# Patient Record
Sex: Male | Born: 1985 | Race: White | Hispanic: No | Marital: Married | Smoking: Former smoker
Health system: Southern US, Community
[De-identification: ages and names within clinical notes are randomized; demographics above are authoritative.]

## PROBLEM LIST (undated history)

## (undated) DIAGNOSIS — K317 Polyp of stomach and duodenum: Secondary | ICD-10-CM

## (undated) DIAGNOSIS — K649 Unspecified hemorrhoids: Secondary | ICD-10-CM

## (undated) DIAGNOSIS — L723 Sebaceous cyst: Secondary | ICD-10-CM

## (undated) DIAGNOSIS — J302 Other seasonal allergic rhinitis: Secondary | ICD-10-CM

## (undated) DIAGNOSIS — K219 Gastro-esophageal reflux disease without esophagitis: Secondary | ICD-10-CM

## (undated) HISTORY — DX: Unspecified hemorrhoids: K64.9

## (undated) HISTORY — PX: WISDOM TOOTH EXTRACTION: SHX21

## (undated) HISTORY — DX: Polyp of stomach and duodenum: K31.7

## (undated) HISTORY — PX: HEMORRHOID BANDING: SHX5850

---

## 2005-04-09 ENCOUNTER — Ambulatory Visit: Payer: Self-pay | Admitting: Internal Medicine

## 2005-07-14 ENCOUNTER — Ambulatory Visit: Payer: Self-pay | Admitting: Gastroenterology

## 2006-03-22 ENCOUNTER — Ambulatory Visit: Payer: Self-pay | Admitting: Internal Medicine

## 2006-10-11 ENCOUNTER — Ambulatory Visit: Payer: Self-pay | Admitting: Internal Medicine

## 2007-12-29 ENCOUNTER — Encounter: Payer: Self-pay | Admitting: *Deleted

## 2007-12-29 DIAGNOSIS — K219 Gastro-esophageal reflux disease without esophagitis: Secondary | ICD-10-CM | POA: Insufficient documentation

## 2007-12-29 DIAGNOSIS — K649 Unspecified hemorrhoids: Secondary | ICD-10-CM | POA: Insufficient documentation

## 2008-01-08 ENCOUNTER — Ambulatory Visit: Payer: Self-pay | Admitting: Internal Medicine

## 2008-01-08 DIAGNOSIS — F17200 Nicotine dependence, unspecified, uncomplicated: Secondary | ICD-10-CM | POA: Insufficient documentation

## 2008-08-11 ENCOUNTER — Ambulatory Visit: Payer: Self-pay | Admitting: Internal Medicine

## 2008-08-11 DIAGNOSIS — M279 Disease of jaws, unspecified: Secondary | ICD-10-CM | POA: Insufficient documentation

## 2010-01-06 ENCOUNTER — Ambulatory Visit: Payer: Self-pay | Admitting: Internal Medicine

## 2010-01-06 LAB — CONVERTED CEMR LAB
ALT: 30 units/L (ref 0–53)
AST: 25 units/L (ref 0–37)
BUN: 16 mg/dL (ref 6–23)
Basophils Relative: 0.2 % (ref 0.0–3.0)
Chloride: 106 meq/L (ref 96–112)
Eosinophils Relative: 2.8 % (ref 0.0–5.0)
GFR calc non Af Amer: 97.09 mL/min (ref >60)
HCT: 47.6 % (ref 39.0–52.0)
Hemoglobin: 15.8 g/dL (ref 13.0–17.0)
LDL Cholesterol: 54 mg/dL (ref 0–99)
Lymphs Abs: 1.9 10*3/uL (ref 0.7–4.0)
MCV: 93.1 fL (ref 78.0–100.0)
Monocytes Relative: 5.8 % (ref 3.0–12.0)
Platelets: 280 10*3/uL (ref 150.0–400.0)
Potassium: 3.6 meq/L (ref 3.5–5.1)
RBC: 5.12 M/uL (ref 4.22–5.81)
Sodium: 143 meq/L (ref 135–145)
TSH: 1.16 microintl units/mL (ref 0.35–5.50)
Total Bilirubin: 0.3 mg/dL (ref 0.3–1.2)
Total CHOL/HDL Ratio: 3
Total Protein: 7 g/dL (ref 6.0–8.3)
VLDL: 17.2 mg/dL (ref 0.0–40.0)
WBC: 7.5 10*3/uL (ref 4.5–10.5)

## 2010-01-08 ENCOUNTER — Ambulatory Visit: Payer: Self-pay | Admitting: Internal Medicine

## 2010-11-23 NOTE — Miscellaneous (Signed)
Summary: Doctor, [facility]   Imported By: Lester Warsaw 01/15/2010 09:07:05  _____________________________________________________________________  External Attachment:    Type:   Image     Comment:   External Document

## 2010-11-23 NOTE — Assessment & Plan Note (Signed)
Summary: CPX-LB   Vital Signs:  Patient profile:   25 year old male Height:      70 inches Weight:      149 pounds BMI:     21.46 O2 Sat:      97 % on Room air Temp:     97.7 degrees F oral Pulse rate:   73 / minute BP sitting:   118 / 78  (left arm) Cuff size:   regular  Vitals Entered By: Bill Salinas CMA (January 08, 2010 1:29 PM)  O2 Flow:  Room air CC: cpx/ ab   Primary Care Provider:  Rayshell Goecke  CC:  cpx/ ab.  History of Present Illness: GI- has been on PPI therapy and would like to stop but whenever he stops he has rebound. Recommend transitioning with H2 blocker.  Had a problem with a painful, question of thrombosed hemorrhoid. Reposnded to heat and anusol HC 2.5%  Current Medications (verified): 1)  Omeprazole 40 Mg Cpdr (Omeprazole) .Marland Kitchen.. 1 Once Daily  Allergies (verified): No Known Drug Allergies  Past History:  Past Medical History: Last updated: 12/29/2007 HEMORRHOIDS (ICD-455.6) GASTROESOPHAGEAL REFLUX DISEASE (ICD-530.81)    Past Surgical History: Last updated: 08/11/2008 wisdom teeth extracted.  Family History: Last updated: 08/11/2008 mother - allergies father - ? 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 mother - allergies father - ? EtOH in grandfather Arthritis in GP MGM - breast cancer MGF with CAD, Lipids, Kidney disease, Diabetes Neg-colon cancer  Social History: Reviewed history from 08/11/2008 and no changes required. 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    Physical Exam  General:  WNWD white male in no distress Head:  Normocephalic and atraumatic without obvious abnormalities. No apparent alopecia or balding. Eyes:  No corneal or conjunctival inflammation noted. EOMI. Perrla. Funduscopic exam benign, without hemorrhages, exudates or papilledema. Vision grossly normal. Ears:  External ear exam shows no significant lesions or deformities.  Otoscopic examination reveals clear canals, tympanic membranes are intact bilaterally without bulging, retraction, inflammation or discharge. Hearing is grossly normal bilaterally. Nose:  External nasal examination shows no deformity or inflammation. Nasal mucosa are pink and moist without lesions or exudates. Mouth:  Oral mucosa and oropharynx without lesions or exudates.  Teeth in good repair. Neck:  No deformities, masses, or tenderness noted. Chest Wall:  No deformities, masses, tenderness or gynecomastia noted. Lungs:  Normal respiratory effort, chest expands symmetrically. Lungs are clear to auscultation, no crackles or wheezes. Heart:  Normal rate and regular rhythm. S1 and S2 normal without gallop, murmur, click, rub or other extra sounds. Abdomen:  Bowel sounds positive,abdomen soft and non-tender without masses, organomegaly or hernias noted. Msk:  No deformity or scoliosis noted of thoracic or lumbar spine.   Pulses:  R and L carotid,radial,femoral,dorsalis pedis and posterior tibial pulses are full and equal bilaterally  Extremities:  No clubbing, cyanosis, edema, or deformity noted with normal full range of motion of all joints.   Neurologic:  No  cranial nerve deficits noted. Station and gait are normal. Plantar reflexes are down-going bilaterally. DTRs are symmetrical throughout. Sensory, motor and coordinative functions appear intact. Skin:  turgor normal, color normal, and no rashes.   Cervical Nodes:  no anterior cervical adenopathy and no posterior cervical adenopathy.   Inguinal Nodes:  no R inguinal adenopathy and no L inguinal adenopathy.   Psych:  Cognition and judgment appear intact. Alert and cooperative with normal attention span and concentration. No apparent delusions, illusions, hallucinations   Impression & Recommendations:  Problem # 1:  GASTROESOPHAGEAL REFLUX DISEASE (ICD-530.81) Discussed trial of weaning off PPI: start otc zantac 150 two times a day and after three days stop PPI. If successfull continue two times a day zantac for one month, then reduce to at bedtime dosing for one month then as needed dosing. If unsuccessful then continue PPI  His updated medication list for this problem includes:    Omeprazole 40 Mg Cpdr (Omeprazole) .Marland Kitchen... 1 once daily  Problem # 2:  HEMORRHOIDS (ICD-455.6) Not an active problem. encourage a regular bowel habit without lingering.  Problem # 3:  CIGARETTE SMOKER (ICD-305.1) Essentially stopped smoking. encouraged him to continue with cessation even in the face of occasional relapse.  Problem # 4:  Preventive Health Care (ICD-V70.0) Normal exam. Lab results are fine. Encouraged a regular aerobic exercise program. Advised to have routine labs in 5 years and complete exam as required by employment or insurance until age 66 at which point he should have 2-3 exams untill age 29 at which time he should have exam every 3 years.  In summary- a healthy man. Encouraged to continue smoking cessatiion efforts.  Complete Medication List: 1)  Omeprazole 40 Mg Cpdr (Omeprazole) .Marland Kitchen.. 1 once daily  Other Orders: Tobacco use cessation intermediate 3-10 minutes (40981)   Patient: PATRICIA  TARDUOGNO Note: All result statuses are Final unless otherwise noted.  Tests: (1) Lipid Panel (LIPID)   Cholesterol               128 mg/dL                   1-914     ATP III Classification            Desirable:  < 200 mg/dL                    Borderline High:  200 - 239 mg/dL               High:  > = 240 mg/dL   Triglycerides             98.0 mg/dL                  7.8-295.6     Normal:  <150 mg/dL     Borderline High:  213 - 199 mg/dL   HDL                       08.65 mg/dL                 >78.46   VLDL Cholesterol          19.6 mg/dL                  9.6-29.5   LDL Cholesterol  51 mg/dL                    1-47  CHO/HDL Ratio:  CHD Risk                             2                    Men          Women     1/2 Average Risk     3.4          3.3     Average Risk          5.0          4.4     2X Average Risk          9.6          7.1     3X Average Risk          15.0          11.0                           Tests: (2) Hepatic/Liver Function Panel (HEPATIC)   Total Bilirubin           0.6 mg/dL                   8.2-9.5   Direct Bilirubin          0.1 mg/dL                   6.2-1.3   Alkaline Phosphatase      59 U/L                      39-117   AST                       24 U/L                      0-37   ALT                       24 U/L                      0-35   Total Protein             7.4 g/dL                    0.8-6.5   Albumin                   4.3 g/dL                    7.8-4.6  Tests: (3) BMP (METABOL)   Sodium                    138 mEq/L                   135-145   Potassium                 3.9 mEq/L                   3.5-5.1   Chloride  102 mEq/L                   96-112   Carbon Dioxide            29 mEq/L                    19-32   Glucose              [H]  104 mg/dL                   16-10   BUN                       15 mg/dL                    9-60   Creatinine                0.7 mg/dL                   4.5-4.0   Calcium                    9.6 mg/dL                   9.8-11.9   GFR                       85.16 mL/min                >60  Immunization History:  Tetanus/Td Immunization History:    Tetanus/Td:  historical (11/24/2006)

## 2012-10-26 ENCOUNTER — Other Ambulatory Visit: Payer: Self-pay | Admitting: Internal Medicine

## 2012-10-26 ENCOUNTER — Telehealth: Payer: Self-pay | Admitting: Internal Medicine

## 2012-10-26 MED ORDER — OSELTAMIVIR PHOSPHATE 75 MG PO CAPS: 75.0000 mg | ORAL_CAPSULE | Freq: Two times a day (BID) | ORAL | Status: DC

## 2012-10-26 NOTE — Telephone Encounter (Signed)
Dr. Myrtis Ser called: Bernard [name] just returned from the [facility] and is running a high fever, myalgia, coryza, doesn't feel well. Did not have flu vaccine.  Plan - for presumed influenza Tamiflu 75 mg bid x 5 days. Household contacts should receive prophylaxis

## 2012-12-18 ENCOUNTER — Encounter: Payer: Self-pay | Admitting: Internal Medicine

## 2012-12-18 ENCOUNTER — Ambulatory Visit (INDEPENDENT_AMBULATORY_CARE_PROVIDER_SITE_OTHER): Payer: 59 | Admitting: Internal Medicine

## 2012-12-18 VITALS — BP 118/74 | HR 104 | Temp 97.8°F | Resp 10 | Wt 150.0 lb

## 2012-12-18 DIAGNOSIS — F5221 Male erectile disorder: Secondary | ICD-10-CM

## 2012-12-18 DIAGNOSIS — F17200 Nicotine dependence, unspecified, uncomplicated: Secondary | ICD-10-CM

## 2012-12-18 DIAGNOSIS — F528 Other sexual dysfunction not due to a substance or known physiological condition: Secondary | ICD-10-CM

## 2012-12-18 NOTE — Patient Instructions (Addendum)
1. Nicotine use - GREAT that you have given up cigarettes. BUT, you need to be Nicotine free.  2. Acid related problems - PPI have questionable long term effects. There is also a rebound phenomena so that when you stop a PPI there can be increased discomfort. Plan Transition to Ranitidine 150 mg AM and PM after 2-3 days stop the PPI and go with just the Ranitidine.  3. Sexual dysfunction - does not sound physiologic but can't be sure. There is certainly potential for this being psychologic. Plan  candid discussion; exploration, i.e. Ruthy Dick, etc  Trail of medication - Viagra  Sex counseling can be very helpful if medications don't

## 2012-12-20 ENCOUNTER — Encounter: Payer: Self-pay | Admitting: Internal Medicine

## 2012-12-20 NOTE — Progress Notes (Signed)
  Subjective:    Patient ID: Bernard [name], male    DOB: 1986-05-23, 27 y.o.   MRN: [id number]  HPI Bernard [name] presents to discuss issues related to loss of tumescence.   In the interval since his last visit he has been doing really well. He has stopped smoking cigarettes but is using a nicotine E-cigarette.   He reports that in a new relationship he has had several episodes of loss of tumescence. By his report this has been related to a particular position of coitus. He has no problems at other times and is able to maintain an erection to completion.   STD transmission, prevention and treatment was discussed with him, particularly around the issue of HPV.   Past Medical History: Last updated: 12/29/2007 HEMORRHOIDS (ICD-455.6) GASTROESOPHAGEAL REFLUX DISEASE (ICD-530.81)    Past Surgical History: Last updated: 08/11/2008 wisdom teeth extracted.  Family History: Last updated: 08/11/2008 mother - allergies father - ? 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     Objective:   Physical Exam Filed Vitals:   12/18/12 1500  BP: 118/74  Pulse: 104  Temp: 97.8 F (36.6 C)  Resp: 10   Gen'l- WNWD white man in no distress HEENT - C&S clear Cor- RRR Pulm - normal respirations Neuro - A&O x 3       Assessment & Plan:  Loss of tumescence - discussed the complexities of sexual function and response. He has no evidence of organic impotence. Issues of tumescence may be tied to performance anxiety.  Plan encourged franks discussion with his partner  Trial of viagra (suspect there  is placebo benefit)  For continued problems recommend meeting with a sex counselor.

## 2012-12-20 NOTE — Assessment & Plan Note (Signed)
Is abstinent from cigarettes for several months - GREAT. Advised to reduce and cease nicotine containing E-cigs.

## 2014-01-06 ENCOUNTER — Telehealth: Payer: Self-pay

## 2014-01-06 ENCOUNTER — Other Ambulatory Visit: Payer: Self-pay | Admitting: Internal Medicine

## 2014-01-06 ENCOUNTER — Encounter: Payer: Self-pay | Admitting: Gastroenterology

## 2014-01-06 DIAGNOSIS — K219 Gastro-esophageal reflux disease without esophagitis: Secondary | ICD-10-CM

## 2014-01-06 NOTE — Telephone Encounter (Signed)
Pt scheduled to see Dr. Arlyce DiceKaplan 01/22/14@10 :30am. Pt aware of appt.

## 2014-01-06 NOTE — Telephone Encounter (Signed)
Message copied by Chrystie NoseHUNT, LINDA R on Mon Jan 06, 2014  2:22 PM ------      Message from: Melvia HeapsKAPLAN, ROBERT D      Created: Mon Jan 06, 2014  2:08 PM      Regarding: RE: From Rosebud PolesJeff Katz       Jeff, happy to reevaluate him.  I'll try to get him in ASAP.      Rob      ----- Message -----         From: Luis AbedJeffrey D Katz, MD         Sent: 01/06/2014   1:02 PM           To: Louis Meckelobert D Kaplan, MD      Subject: From Jerral BonitoJeff Katz                                           Avanell ShackletonHi Rob,              Mike Norins has sent you a consult request today for my step-son Bernard [name]. You had seen him many years ago. He continues to have reflux symptoms despite meds. Please see him when you can. I am worried about the persistence of his symptoms. You will know best, but I suspect it is time for endoscopy along with change in his therapy and follow up.            Thanks a bunch.             Trey PaulaJeff       ------

## 2014-01-22 ENCOUNTER — Encounter: Payer: Self-pay | Admitting: Gastroenterology

## 2014-01-22 ENCOUNTER — Ambulatory Visit (INDEPENDENT_AMBULATORY_CARE_PROVIDER_SITE_OTHER): Payer: BC Managed Care – PPO | Admitting: Gastroenterology

## 2014-01-22 VITALS — BP 120/86 | HR 72 | Ht 69.0 in | Wt 153.5 lb

## 2014-01-22 DIAGNOSIS — K219 Gastro-esophageal reflux disease without esophagitis: Secondary | ICD-10-CM

## 2014-01-22 NOTE — Patient Instructions (Signed)
Gastroesophageal Reflux Disease, Adult Gastroesophageal reflux disease (GERD) happens when acid from your stomach flows up into the esophagus. When acid comes in contact with the esophagus, the acid causes soreness (inflammation) in the esophagus. Over time, GERD may create small holes (ulcers) in the lining of the esophagus. CAUSES   Increased body weight. This puts pressure on the stomach, making acid rise from the stomach into the esophagus.  Smoking. This increases acid production in the stomach.  Drinking alcohol. This causes decreased pressure in the lower esophageal sphincter (valve or ring of muscle between the esophagus and stomach), allowing acid from the stomach into the esophagus.  Late evening meals and a full stomach. This increases pressure and acid production in the stomach.  A malformed lower esophageal sphincter. Sometimes, no cause is found. SYMPTOMS   Burning pain in the lower part of the mid-chest behind the breastbone and in the mid-stomach area. This may occur twice a week or more often.  Trouble swallowing.  Sore throat.  Dry cough.  Asthma-like symptoms including chest tightness, shortness of breath, or wheezing. DIAGNOSIS  Your caregiver may be able to diagnose GERD based on your symptoms. In some cases, X-rays and other tests may be done to check for complications or to check the condition of your stomach and esophagus. TREATMENT  Your caregiver may recommend over-the-counter or prescription medicines to help decrease acid production. Ask your caregiver before starting or adding any new medicines.  HOME CARE INSTRUCTIONS   Change the factors that you can control. Ask your caregiver for guidance concerning weight loss, quitting smoking, and alcohol consumption.  Avoid foods and drinks that make your symptoms worse, such as:  Caffeine or alcoholic drinks.  Chocolate.  Peppermint or mint flavorings.  Garlic and onions.  Spicy foods.  Citrus fruits,  such as oranges, lemons, or limes.  Tomato-based foods such as sauce, chili, salsa, and pizza.  Fried and fatty foods.  Avoid lying down for the 3 hours prior to your bedtime or prior to taking a nap.  Eat small, frequent meals instead of large meals.  Wear loose-fitting clothing. Do not wear anything tight around your waist that causes pressure on your stomach.  Raise the head of your bed 6 to 8 inches with wood blocks to help you sleep. Extra pillows will not help.  Only take over-the-counter or prescription medicines for pain, discomfort, or fever as directed by your caregiver.  Do not take aspirin, ibuprofen, or other nonsteroidal anti-inflammatory drugs (NSAIDs). SEEK IMMEDIATE MEDICAL CARE IF:   You have pain in your arms, neck, jaw, teeth, or back.  Your pain increases or changes in intensity or duration.  You develop nausea, vomiting, or sweating (diaphoresis).  You develop shortness of breath, or you faint.  Your vomit is green, yellow, black, or looks like coffee grounds or blood.  Your stool is red, bloody, or black. These symptoms could be signs of other problems, such as heart disease, gastric bleeding, or esophageal bleeding. MAKE SURE YOU:   Understand these instructions.  Will watch your condition.  Will get help right away if you are not doing well or get worse. Document Released: 07/20/2005 Document Revised: 01/02/2012 Document Reviewed: 04/29/2011 [facility] Patient Information 2014 [place], [place].   Medication is being sent to your pharmacy

## 2014-01-22 NOTE — Progress Notes (Signed)
    _                                                                                                                History of Present Illness: Pleasant 28 year old white male referred for evaluation of reflux.  Over the past 10 years he's had frequent pyrosis for which she takes over-the-counter omeprazole.  For the past month symptoms have worsened characterized by frequent breakthrough pyrosis throughout the day and occasionally at bedtime.  Since increasing omeprazole to 2 tablets in the morning symptoms have significantly improved.  He denies nausea, dysphagia, coughing or sore throat.  He is on no gastric irritants including nonsteroidals.    History reviewed. No pertinent past medical history. History reviewed. No pertinent past surgical history. family history includes Breast cancer in his maternal grandmother; Heart disease in his father; Hypertension in his father. Current Outpatient Prescriptions  Medication Sig Dispense Refill  . omeprazole (PRILOSEC OTC) 20 MG tablet Take 20 mg by mouth 2 (two) times daily.       No current facility-administered medications for this visit.   Allergies as of 01/22/2014  . (No Known Allergies)    reports that he quit smoking about 13 months ago. He has never used smokeless tobacco. He reports that he drinks alcohol. He reports that he does not use illicit drugs.     Review of Systems: Pertinent positive and negative review of systems were noted in the above HPI section. All other review of systems were otherwise negative.  Vital signs were reviewed in today's medical record Physical Exam: General: Well developed , well nourished, no acute distress Skin: anicteric Head: Normocephalic and atraumatic Eyes:  sclerae anicteric, EOMI Ears: Normal auditory acuity Mouth: No deformity or lesions Neck: Supple, no masses or thyromegaly Lungs: Clear throughout to auscultation Heart: Regular rate and rhythm; no murmurs, rubs or  bruits Abdomen: Soft, non tender and non distended. No masses, hepatosplenomegaly or hernias noted. Normal Bowel sounds Rectal:deferred Musculoskeletal: Symmetrical with no gross deformities  Skin: No lesions on visible extremities Pulses:  Normal pulses noted Extremities: No clubbing, cyanosis, edema or deformities noted Neurological: Alert oriented x 4, grossly nonfocal Cervical Nodes:  No significant cervical adenopathy Inguinal Nodes: No significant inguinal adenopathy Psychological:  Alert and cooperative. Normal mood and affect  See Assessment and Plan under Problem List

## 2014-01-22 NOTE — Assessment & Plan Note (Signed)
Symptoms are now well-controlled with 20 mg dose of omeprazole.  Plan to continue with the same.  He was advised to continue for at least 4 more weeks and then try reducing to every other day or perhaps every third day while using antacids as a rescue medication.  Information on GERD including anti-reflex measures was given to the patient.  Should symptoms recur or worsen, consider switching PPI therapy.

## 2014-02-18 ENCOUNTER — Ambulatory Visit: Payer: 59 | Admitting: Gastroenterology

## 2014-09-17 ENCOUNTER — Telehealth: Payer: Self-pay | Admitting: Gastroenterology

## 2014-09-17 NOTE — Telephone Encounter (Signed)
Patient c/o anal itching. He has been using Preparation H for rectal itching but reports it is not helping. Appointment made for evaluation. Meanwhile he will stop using of the cream. Cleanse gently with water and cotton balls, allowing area to dry completely. No scrubbing or scratching.

## 2014-09-17 NOTE — Telephone Encounter (Signed)
I have left message for the patient to call back 

## 2014-09-22 ENCOUNTER — Ambulatory Visit (INDEPENDENT_AMBULATORY_CARE_PROVIDER_SITE_OTHER): Payer: BC Managed Care – PPO | Admitting: Gastroenterology

## 2014-09-22 ENCOUNTER — Encounter: Payer: Self-pay | Admitting: Gastroenterology

## 2014-09-22 VITALS — BP 114/80 | HR 82 | Ht 69.0 in | Wt 155.6 lb

## 2014-09-22 DIAGNOSIS — K219 Gastro-esophageal reflux disease without esophagitis: Secondary | ICD-10-CM

## 2014-09-22 DIAGNOSIS — L290 Pruritus ani: Secondary | ICD-10-CM | POA: Insufficient documentation

## 2014-09-22 MED ORDER — OMEPRAZOLE 40 MG PO CPDR: 40.0000 mg | DELAYED_RELEASE_CAPSULE | Freq: Every day | ORAL | Status: DC

## 2014-09-22 MED ORDER — HYDROCORTISONE ACETATE 25 MG RE SUPP: 25.0000 mg | Freq: Two times a day (BID) | RECTAL | Status: DC

## 2014-09-22 NOTE — Progress Notes (Signed)
      History of Present Illness:  [name] has returned for evaluation of anal pruritus.  This is been an ongoing problem despite topicals.  He rarely has seen blood on the toilet tissue.  He moves his bowels regularly.  He has a history of hemorrhoids.  When they have flared he has had protrusion and swelling.    Review of Systems: Pertinent positive and negative review of systems were noted in the above HPI section. All other review of systems were otherwise negative.    Current Medications, Allergies, Past Medical History, Past Surgical History, Family History and Social History [facility]  Vital signs were reviewed in today's medical record. Physical Exam: General: Well developed , well nourished, no acute distress Inspection of the anus does not demonstrate any abnormalities   See Assessment and Plan under Problem List

## 2014-09-22 NOTE — Assessment & Plan Note (Signed)
Symptoms are likely reflective of hemorrhoidal disease.   Recommendations #1 Anusol HC suppositories

## 2014-09-22 NOTE — Patient Instructions (Signed)
Your prescription will be sent to your pharmacy 

## 2014-09-22 NOTE — Assessment & Plan Note (Signed)
Symptoms are well-controlled with omeprazole 20 mg daily.  Will try switching to 40 mg once daily

## 2015-02-12 ENCOUNTER — Telehealth: Payer: Self-pay | Admitting: Internal Medicine

## 2015-02-12 NOTE — Telephone Encounter (Signed)
yes

## 2015-02-12 NOTE — Telephone Encounter (Signed)
Patient wants to transfer to you from Dr. Debby BudNorins, please. He was referred to you by Dr. Beverlyn RouxJeff Catz.

## 2015-02-16 ENCOUNTER — Encounter: Payer: Self-pay | Admitting: Internal Medicine

## 2015-02-16 ENCOUNTER — Ambulatory Visit (INDEPENDENT_AMBULATORY_CARE_PROVIDER_SITE_OTHER): Payer: BLUE CROSS/BLUE SHIELD | Admitting: Internal Medicine

## 2015-02-16 ENCOUNTER — Other Ambulatory Visit (INDEPENDENT_AMBULATORY_CARE_PROVIDER_SITE_OTHER): Payer: BLUE CROSS/BLUE SHIELD

## 2015-02-16 VITALS — BP 112/80 | HR 72 | Temp 98.3°F | Resp 16 | Ht 69.0 in | Wt 160.0 lb

## 2015-02-16 DIAGNOSIS — S161XXA Strain of muscle, fascia and tendon at neck level, initial encounter: Secondary | ICD-10-CM

## 2015-02-16 DIAGNOSIS — K2100 Gastro-esophageal reflux disease with esophagitis, without bleeding: Secondary | ICD-10-CM

## 2015-02-16 DIAGNOSIS — R229 Localized swelling, mass and lump, unspecified: Secondary | ICD-10-CM

## 2015-02-16 DIAGNOSIS — Z0000 Encounter for general adult medical examination without abnormal findings: Secondary | ICD-10-CM | POA: Diagnosis not present

## 2015-02-16 DIAGNOSIS — R220 Localized swelling, mass and lump, head: Secondary | ICD-10-CM

## 2015-02-16 LAB — CBC WITH DIFFERENTIAL/PLATELET
BASOS PCT: 0.5 % (ref 0.0–3.0)
Basophils Absolute: 0 10*3/uL (ref 0.0–0.1)
Eosinophils Absolute: 0.2 10*3/uL (ref 0.0–0.7)
Eosinophils Relative: 2.9 % (ref 0.0–5.0)
HCT: 46.2 % (ref 39.0–52.0)
Hemoglobin: 15.8 g/dL (ref 13.0–17.0)
Lymphocytes Relative: 21.2 % (ref 12.0–46.0)
Lymphs Abs: 1.3 10*3/uL (ref 0.7–4.0)
MCHC: 34.2 g/dL (ref 30.0–36.0)
MCV: 85 fl (ref 78.0–100.0)
Monocytes Absolute: 0.6 10*3/uL (ref 0.1–1.0)
Monocytes Relative: 10.3 % (ref 3.0–12.0)
NEUTROS ABS: 3.9 10*3/uL (ref 1.4–7.7)
NEUTROS PCT: 65.1 % (ref 43.0–77.0)
Platelets: 282 10*3/uL (ref 150.0–400.0)
RBC: 5.44 Mil/uL (ref 4.22–5.81)
RDW: 13.2 % (ref 11.5–15.5)
WBC: 6.1 10*3/uL (ref 4.0–10.5)

## 2015-02-16 LAB — LIPID PANEL
CHOL/HDL RATIO: 2
Cholesterol: 120 mg/dL (ref 0–200)
HDL: 48.5 mg/dL (ref >39.00)
LDL Cholesterol: 63 mg/dL (ref 0–99)
NonHDL: 71.5
Triglycerides: 41 mg/dL (ref 0.0–149.0)
VLDL: 8.2 mg/dL (ref 0.0–40.0)

## 2015-02-16 LAB — COMPREHENSIVE METABOLIC PANEL
ALBUMIN: 4.4 g/dL (ref 3.5–5.2)
ALT: 45 U/L (ref 0–53)
AST: 26 U/L (ref 0–37)
Alkaline Phosphatase: 54 U/L (ref 39–117)
BUN: 18 mg/dL (ref 6–23)
CO2: 30 mEq/L (ref 19–32)
Calcium: 9.7 mg/dL (ref 8.4–10.5)
Chloride: 102 mEq/L (ref 96–112)
Creatinine, Ser: 1.01 mg/dL (ref 0.40–1.50)
GFR: 92.37 mL/min (ref >60.00)
Glucose, Bld: 99 mg/dL (ref 70–99)
POTASSIUM: 4.5 meq/L (ref 3.5–5.1)
Sodium: 138 mEq/L (ref 135–145)
Total Bilirubin: 0.4 mg/dL (ref 0.2–1.2)
Total Protein: 7.1 g/dL (ref 6.0–8.3)

## 2015-02-16 LAB — TSH: TSH: 1.27 u[IU]/mL (ref 0.35–4.50)

## 2015-02-16 MED ORDER — HYOSCYAMINE SULFATE ER 0.375 MG PO TB12: 0.3750 mg | ORAL_TABLET | Freq: Two times a day (BID) | ORAL | Status: DC

## 2015-02-16 NOTE — Assessment & Plan Note (Signed)
Will send to general surgery to consider biopsy/excision

## 2015-02-16 NOTE — Assessment & Plan Note (Signed)
This has resolved.

## 2015-02-16 NOTE — Assessment & Plan Note (Signed)
Vaccines were reviewed Exam done Labs ordered Pt ed material was given 
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
## 2015-02-16 NOTE — Patient Instructions (Addendum)
Torticollis, Acute You have suddenly (acutely) developed a twisted neck (torticollis). This is usually a self-limited condition. CAUSES  Acute torticollis may be caused by malposition, trauma or infection. Most commonly, acute torticollis is caused by sleeping in an awkward position. Torticollis may also be caused by the flexion, extension or twisting of the neck muscles beyond their normal position. Sometimes, the exact cause may not be known. SYMPTOMS  Usually, there is pain and limited movement of the neck. Your neck may twist to one side. DIAGNOSIS  The diagnosis is often made by physical examination. X-rays, CT scans or MRIs may be done if there is a history of trauma or concern of infection. TREATMENT  For a common, stiff neck that develops during sleep, treatment is focused on relaxing the contracted neck muscle. Medications (including shots) may be used to treat the problem. Most cases resolve in several days. Torticollis usually responds to conservative physical therapy. If left untreated, the shortened and spastic neck muscle can cause deformities in the face and neck. Rarely, surgery is required. HOME CARE INSTRUCTIONS   Use over-the-counter and prescription medications as directed by your caregiver.  Do stretching exercises and massage the neck as directed by your caregiver.  Follow up with physical therapy if needed and as directed by your caregiver. SEEK IMMEDIATE MEDICAL CARE IF:   You develop difficulty breathing or noisy breathing (stridor).  You drool, develop trouble swallowing or have pain with swallowing.  You develop numbness or weakness in the hands or feet.  You have changes in speech or vision.  You have problems with urination or bowel movements.  You have difficulty walking.  You have a fever.  You have increased pain. MAKE SURE YOU:   Understand these instructions.  Will watch your condition.  Will get help right away if you are not doing well or  get worse. Document Released: 10/07/2000 Document Revised: 01/02/2012 Document Reviewed: 11/18/2009 [facility] Patient Information 2015 [place], [place]. This information is not intended to replace advice given to you by your health care provider. Make sure you discuss any questions you have with your health care provider. Health Maintenance A healthy lifestyle and preventative care can promote health and wellness.  Maintain regular health, dental, and eye exams.  Eat a healthy diet. Foods like vegetables, fruits, whole grains, low-fat dairy products, and lean protein foods contain the nutrients you need and are low in calories. Decrease your intake of foods high in solid fats, added sugars, and salt. Get information about a proper diet from your health care provider, if necessary.  Regular physical exercise is one of the most important things you can do for your health. Most adults should get at least 150 minutes of moderate-intensity exercise (any activity that increases your heart rate and causes you to sweat) each week. In addition, most adults need muscle-strengthening exercises on 2 or more days a week.   Maintain a healthy weight. The body mass index (BMI) is a screening tool to identify possible weight problems. It provides an estimate of body fat based on height and weight. Your health care provider can find your BMI and can help you achieve or maintain a healthy weight. For males 20 years and older:  A BMI below 18.5 is considered underweight.  A BMI of 18.5 to 24.9 is normal.  A BMI of 25 to 29.9 is considered overweight.  A BMI of 30 and above is considered obese.  Maintain normal blood lipids and cholesterol by exercising and minimizing your  intake of saturated fat. Eat a balanced diet with plenty of fruits and vegetables. Blood tests for lipids and cholesterol should begin at age 30 and be repeated every 5 years. If your lipid or cholesterol levels are high, you are over age 30, or  you are at high risk for heart disease, you may need your cholesterol levels checked more frequently.Ongoing high lipid and cholesterol levels should be treated with medicines if diet and exercise are not working.  If you smoke, find out from your health care provider how to quit. If you do not use tobacco, do not start.  Lung cancer screening is recommended for adults aged 55-80 years who are at high risk for developing lung cancer because of a history of smoking. A yearly low-dose CT scan of the lungs is recommended for people who have at least a 30-pack-year history of smoking and are current smokers or have quit within the past 15 years. A pack year of smoking is smoking an average of 1 pack of cigarettes a day for 1 year (for example, a 30-pack-year history of smoking could mean smoking 1 pack a day for 30 years or 2 packs a day for 15 years). Yearly screening should continue until the smoker has stopped smoking for at least 15 years. Yearly screening should be stopped for people who develop a health problem that would prevent them from having lung cancer treatment.  If you choose to drink alcohol, do not have more than 2 drinks per day. One drink is considered to be 12 oz (360 mL) of beer, 5 oz (150 mL) of wine, or 1.5 oz (45 mL) of liquor.  Avoid the use of street drugs. Do not share needles with anyone. Ask for help if you need support or instructions about stopping the use of drugs.  High blood pressure causes heart disease and increases the risk of stroke. Blood pressure should be checked at least every 1-2 years. Ongoing high blood pressure should be treated with medicines if weight loss and exercise are not effective.  If you are 2545-30 years old, ask your health care provider if you should take aspirin to prevent heart disease.  Diabetes screening involves taking a blood sample to check your fasting blood sugar level. This should be done once every 3 years after age 30 if you are at a  normal weight and without risk factors for diabetes. Testing should be considered at a younger age or be carried out more frequently if you are overweight and have at least 1 risk factor for diabetes.  Colorectal cancer can be detected and often prevented. Most routine colorectal cancer screening begins at the age of 30 and continues through age 30. However, your health care provider may recommend screening at an earlier age if you have risk factors for colon cancer. On a yearly basis, your health care provider may provide home test kits to check for hidden blood in the stool. A small camera at the end of a tube may be used to directly examine the colon (sigmoidoscopy or colonoscopy) to detect the earliest forms of colorectal cancer. Talk to your health care provider about this at age 30 when routine screening begins. A direct exam of the colon should be repeated every 5-10 years through age 30, unless early forms of precancerous polyps or small growths are found.  People who are at an increased risk for hepatitis B should be screened for this virus. You are considered at high risk for hepatitis B  if:  You were born in a country where hepatitis B occurs often. Talk with your health care provider about which countries are considered high risk.  Your parents were born in a high-risk country and you have not received a shot to protect against hepatitis B (hepatitis B vaccine).  You have HIV or AIDS.  You use needles to inject street drugs.  You live with, or have sex with, someone who has hepatitis B.  You are a man who has sex with other men (MSM).  You get hemodialysis treatment.  You take certain medicines for conditions like cancer, organ transplantation, and autoimmune conditions.  Hepatitis C blood testing is recommended for all people born from [birth year] through 1966 and any individual with known risk factors for hepatitis C.  Healthy men should no longer receive prostate-specific antigen  (PSA) blood tests as part of routine cancer screening. Talk to your health care provider about prostate cancer screening.  Testicular cancer screening is not recommended for adolescents or adult males who have no symptoms. Screening includes self-exam, a health care provider exam, and other screening tests. Consult with your health care provider about any symptoms you have or any concerns you have about testicular cancer.  Practice safe sex. Use condoms and avoid high-risk sexual practices to reduce the spread of sexually transmitted infections (STIs).  You should be screened for STIs, including gonorrhea and chlamydia if:  You are sexually active and are younger than 24 years.  You are older than 24 years, and your health care provider tells you that you are at risk for this type of infection.  Your sexual activity has changed since you were last screened, and you are at an increased risk for chlamydia or gonorrhea. Ask your health care provider if you are at risk.  If you are at risk of being infected with HIV, it is recommended that you take a prescription medicine daily to prevent HIV infection. This is called pre-exposure prophylaxis (PrEP). You are considered at risk if:  You are a man who has sex with other men (MSM).  You are a heterosexual man who is sexually active with multiple partners.  You take drugs by injection.  You are sexually active with a partner who has HIV.  Talk with your health care provider about whether you are at high risk of being infected with HIV. If you choose to begin PrEP, you should first be tested for HIV. You should then be tested every 3 months for as long as you are taking PrEP.  Use sunscreen. Apply sunscreen liberally and repeatedly throughout the day. You should seek shade when your shadow is shorter than you. Protect yourself by wearing long sleeves, pants, a wide-brimmed hat, and sunglasses year round whenever you are outdoors.  Tell your health  care provider of new moles or changes in moles, especially if there is a change in shape or color. Also, tell your health care provider if a mole is larger than the size of a pencil eraser.  A one-time screening for abdominal aortic aneurysm (AAA) and surgical repair of large AAAs by ultrasound is recommended for men aged 65-75 years who are current or former smokers.  Stay current with your vaccines (immunizations). Document Released: 04/07/2008 Document Revised: 10/15/2013 Document Reviewed: 03/07/2011 [facility] Patient Information 2015 [place], [place]. This information is not intended to replace advice given to you by your health care provider. Make sure you discuss any questions you have with your health care provider.

## 2015-02-16 NOTE — Progress Notes (Signed)
Pre visit review using our clinic review tool, if applicable. No additional management support is needed unless otherwise documented below in the visit note. 

## 2015-02-16 NOTE — Assessment & Plan Note (Signed)
He has GERD that is refractory to the usual PPI dose, I think the nicotine from vaping is the major cause for this and I have asked him to stop vaping. Will cont the PPI and will add on levbid for additional symptom relief He wants to consider having an EGD done to screen for UGI complications so I have sent a GI referral

## 2015-03-04 ENCOUNTER — Other Ambulatory Visit: Payer: Self-pay | Admitting: Surgery

## 2015-03-09 ENCOUNTER — Ambulatory Visit (INDEPENDENT_AMBULATORY_CARE_PROVIDER_SITE_OTHER): Payer: BLUE CROSS/BLUE SHIELD | Admitting: Physician Assistant

## 2015-03-09 ENCOUNTER — Encounter: Payer: Self-pay | Admitting: Physician Assistant

## 2015-03-09 VITALS — BP 108/74 | HR 72 | Ht 69.0 in | Wt 160.0 lb

## 2015-03-09 DIAGNOSIS — K219 Gastro-esophageal reflux disease without esophagitis: Secondary | ICD-10-CM

## 2015-03-09 DIAGNOSIS — R12 Heartburn: Secondary | ICD-10-CM

## 2015-03-09 NOTE — Progress Notes (Signed)
Patient ID: Bernard [name], male   DOB: Dec 29, 1985, 29 y.o.   MRN: [id number]   Subjective:    Patient ID: Bernard [name], male    DOB: Dec 29, 1985, 29 y.o.   MRN: [id number]  HPI Bernard [name] is a 29 year old white male known to Dr. Arlyce [name] who is referred today by Dr. Yetta [name] for consideration of endoscopy. Patient has history of chronic GERD and is now having heartburn symptoms refractory to PPI therapy. Patient states that he had heartburn symptoms starting in his late teens which was initially not well controlled. About 5 years ago he started on omeprazole which works well until October 2015. He shouldn't-his dose to 40 mg on his own and had seen Dr. Arlyce [name] who at that time did not feel that he needed to go undergo EGD. About a month and a half ago he started having recurrent heartburn symptoms recent dosage to 80 mg per day and then saw Dr. Yetta [name]. He really didn't notice any change in his symptoms with the high-dose PPI therapy. Dr. Yetta [name] placed him on Levbid twice daily and he says his symptoms have subsided about 75%. He still has some heartburn on a daily basis. He describes it as a dull heat type of feeling in his chest he does not have any sour brash and no dysphagia or odynophasia. Sometimes is worse in the mornings before he eats and then after dinner in the evenings. He is not aware of nocturnal symptoms. He is not taking any regular aspirin or NSAIDs. He is a smoker.  Review of Systems Pertinent positive and negative review of systems were noted in the above HPI section.  All other review of systems was otherwise negative.  Outpatient Encounter Prescriptions as of 03/09/2015  Medication Sig  . hyoscyamine (LEVBID) 0.375 MG 12 hr tablet Take 1 tablet (0.375 mg total) by mouth 2 (two) times daily.  Marland Kitchen. omeprazole (PRILOSEC) 40 MG capsule Take 1 capsule (40 mg total) by mouth daily. (Patient taking differently: Take 40 mg by mouth daily. )  . [DISCONTINUED] hydrocortisone (ANUCORT-HC) 25 MG  suppository Place 1 suppository (25 mg total) rectally 2 (two) times daily.   No facility-administered encounter medications on file as of 03/09/2015.   No Known Allergies Patient Active Problem List   Diagnosis Date Noted  . Scalp mass 02/16/2015  . Routine general medical examination at a health care facility 02/16/2015  . Cervical strain, acute 02/16/2015  . Pruritus ani 09/22/2014  . CIGARETTE SMOKER 01/08/2008  . GASTROESOPHAGEAL REFLUX DISEASE 12/29/2007   History   Social History  . Marital Status: Single    Spouse Name: N/A  . Number of Children: 0  . Years of Education: N/A   Occupational History  . [employer] representativeconstruction    Social History Main Topics  . Smoking status: Current Every Day Smoker -- 1.00 packs/day for 10 years    Types: E-cigarettes, Cigarettes    Last Attempt to Quit: 11/24/2012  . Smokeless tobacco: Never Used  . Alcohol Use: 0.0 oz/week    0 Standard drinks or equivalent per week     Comment: occasional  . Drug Use: No  . Sexual Activity:    Partners: Female   Other Topics Concern  . Not on file   Social History Narrative    [name] family history includes Breast cancer in his maternal grandmother; Heart disease in his father; Hypertension in his father.      Objective:    Filed Vitals:   03/09/15 [id number]  BP: 108/74  Pulse: 72    Physical Exam  well-developed young white male in no acute distress blood pressure 108/74 pulse 72 height 5 foot 9 weight 160. HEENT; nontraumatic normocephalic EOMI PERRLA sclera anicteric, Supple; no JVD, Cardiovascula;r regular rate and rhythm with S1-S2 no murmur or gallop, Pulmonary; clear bilaterally, Abdomen ;soft nontender nondistended bowel sounds are active there is no palpable mass or hepatosplenomegaly, Rectal;exam not done, Ext; no clubbing cyanosis or edema, Psych; mood and affect appropriate       Assessment & Plan:   #1 29 yo male with chronic GERD x 10 years, now refractory to PPI rx . He has  had response to antispasmotic which is interesting. #2 Smoker  Plan; Will schedule for EGD with Dr. Leeroy BockKaplan.-procedure discussed in detail with pt and he is agreeable to proceed. Antireflux regimen  discussed including Npo x 2 hours before bed and elevation of HOB For now he will continue Omeprazole 40 mg po qam, and Levbid BID.   Bernard Oswald HillockS Esterwood PA-C 03/09/2015   Cc: Etta GrandchildJones, Thomas L, MD

## 2015-03-09 NOTE — Patient Instructions (Addendum)
Continue the Omeprazole 40 mg, 1 tab bu mouth every morning. We have given you an antireflux regemine.  You have been scheduled for an endoscopy. Please follow written instructions given to you at your visit today. If you use inhalers (even only as needed), please bring them with you on the day of your procedure. Your physician has requested that you go to www.startemmi.com and enter the access code given to you at your visit today. This web site gives a general overview about your procedure. However, you should still follow specific instructions given to you by our office regarding your preparation for the procedure.

## 2015-03-09 NOTE — Progress Notes (Signed)
Reviewed and agree with management.  Would consider a bravo pH study if EGD is negative Barbette Hairobert D. Arlyce DiceKaplan, M.D., [facility]

## 2015-03-16 ENCOUNTER — Ambulatory Visit (INDEPENDENT_AMBULATORY_CARE_PROVIDER_SITE_OTHER): Payer: BLUE CROSS/BLUE SHIELD | Admitting: Internal Medicine

## 2015-03-16 ENCOUNTER — Encounter: Payer: Self-pay | Admitting: Internal Medicine

## 2015-03-16 VITALS — BP 112/80 | HR 83 | Temp 98.2°F | Resp 12 | Ht 69.0 in | Wt 160.0 lb

## 2015-03-16 DIAGNOSIS — K2100 Gastro-esophageal reflux disease with esophagitis, without bleeding: Secondary | ICD-10-CM

## 2015-03-16 DIAGNOSIS — R229 Localized swelling, mass and lump, unspecified: Secondary | ICD-10-CM

## 2015-03-16 DIAGNOSIS — R220 Localized swelling, mass and lump, head: Secondary | ICD-10-CM

## 2015-03-16 NOTE — Assessment & Plan Note (Signed)
To be removed by general surgery soon

## 2015-03-16 NOTE — Assessment & Plan Note (Signed)
Cont current meds I await the EGD results

## 2015-03-16 NOTE — Progress Notes (Signed)
Pre visit review using our clinic review tool, if applicable. No additional management support is needed unless otherwise documented below in the visit note. 

## 2015-03-16 NOTE — Patient Instructions (Signed)
Gastroesophageal Reflux Disease, Adult Gastroesophageal reflux disease (GERD) happens when acid from your stomach flows up into the esophagus. When acid comes in contact with the esophagus, the acid causes soreness (inflammation) in the esophagus. Over time, GERD may create small holes (ulcers) in the lining of the esophagus. CAUSES   Increased body weight. This puts pressure on the stomach, making acid rise from the stomach into the esophagus.  Smoking. This increases acid production in the stomach.  Drinking alcohol. This causes decreased pressure in the lower esophageal sphincter (valve or ring of muscle between the esophagus and stomach), allowing acid from the stomach into the esophagus.  Late evening meals and a full stomach. This increases pressure and acid production in the stomach.  A malformed lower esophageal sphincter. Sometimes, no cause is found. SYMPTOMS   Burning pain in the lower part of the mid-chest behind the breastbone and in the mid-stomach area. This may occur twice a week or more often.  Trouble swallowing.  Sore throat.  Dry cough.  Asthma-like symptoms including chest tightness, shortness of breath, or wheezing. DIAGNOSIS  Your caregiver may be able to diagnose GERD based on your symptoms. In some cases, X-rays and other tests may be done to check for complications or to check the condition of your stomach and esophagus. TREATMENT  Your caregiver may recommend over-the-counter or prescription medicines to help decrease acid production. Ask your caregiver before starting or adding any new medicines.  HOME CARE INSTRUCTIONS   Change the factors that you can control. Ask your caregiver for guidance concerning weight loss, quitting smoking, and alcohol consumption.  Avoid foods and drinks that make your symptoms worse, such as:  Caffeine or alcoholic drinks.  Chocolate.  Peppermint or mint flavorings.  Garlic and onions.  Spicy foods.  Citrus fruits,  such as oranges, lemons, or limes.  Tomato-based foods such as sauce, chili, salsa, and pizza.  Fried and fatty foods.  Avoid lying down for the 3 hours prior to your bedtime or prior to taking a nap.  Eat small, frequent meals instead of large meals.  Wear loose-fitting clothing. Do not wear anything tight around your waist that causes pressure on your stomach.  Raise the head of your bed 6 to 8 inches with wood blocks to help you sleep. Extra pillows will not help.  Only take over-the-counter or prescription medicines for pain, discomfort, or fever as directed by your caregiver.  Do not take aspirin, ibuprofen, or other nonsteroidal anti-inflammatory drugs (NSAIDs). SEEK IMMEDIATE MEDICAL CARE IF:   You have pain in your arms, neck, jaw, teeth, or back.  Your pain increases or changes in intensity or duration.  You develop nausea, vomiting, or sweating (diaphoresis).  You develop shortness of breath, or you faint.  Your vomit is green, yellow, black, or looks like coffee grounds or blood.  Your stool is red, bloody, or black. These symptoms could be signs of other problems, such as heart disease, gastric bleeding, or esophageal bleeding. MAKE SURE YOU:   Understand these instructions.  Will watch your condition.  Will get help right away if you are not doing well or get worse. Document Released: 07/20/2005 Document Revised: 01/02/2012 Document Reviewed: 04/29/2011 Exit[employer] Patient Information 2015 [employer]. This information is not intended to replace advice given to you by your health care provider. Make sure you discuss any questions you have with your health care provider.  

## 2015-03-16 NOTE — Progress Notes (Signed)
Subjective:  Patient ID: Bernard [name], male    DOB: [birth year]  Age: 29 y.o. MRN: [id number]  CC: Gastrophageal Reflux   HPI Bernard [name] presents for f/up on GERD - he is better with levbid and a PPI. Will have an EGD done soon, Scalp mass will removed by GS soon. No complaints offered today other than occasional heartburn.  Outpatient Prescriptions Prior to Visit  Medication Sig Dispense Refill  . hyoscyamine (LEVBID) 0.375 MG 12 hr tablet Take 1 tablet (0.375 mg total) by mouth 2 (two) times daily. 60 tablet 11  . omeprazole (PRILOSEC) 40 MG capsule Take 1 capsule (40 mg total) by mouth daily. (Patient taking differently: Take 40 mg by mouth daily. ) 90 capsule 3   No facility-administered medications prior to visit.    ROS Review of Systems  Constitutional: Negative.  Negative for fever, chills, diaphoresis, activity change, appetite change, fatigue and unexpected weight change.  HENT: Negative.  Negative for trouble swallowing.   Eyes: Negative.   Respiratory: Negative.  Negative for cough.   Cardiovascular: Negative.  Negative for chest pain, palpitations and leg swelling.  Gastrointestinal: Negative.  Negative for nausea, vomiting, abdominal pain, diarrhea, constipation and blood in stool.  Endocrine: Negative.   Genitourinary: Negative.   Musculoskeletal: Negative.   Skin: Negative.   Allergic/Immunologic: Negative.   Neurological: Negative.   Hematological: Negative.   Psychiatric/Behavioral: Negative.     Objective:  BP 112/80 mmHg  Pulse 83  Temp(Src) 98.2 F (36.8 C) (Oral)  Resp 12  Ht  (1.753 m)  Wt 160 lb (72.576 kg)  BMI 23.62 kg/m2  SpO2 98%  BP Readings from Last 3 Encounters:  03/16/15 112/80  03/09/15 108/74  02/16/15 112/80    Wt Readings from Last 3 Encounters:  03/16/15 160 lb (72.576 kg)  03/09/15 160 lb (72.576 kg)  02/16/15 160 lb (72.576 kg)    Physical Exam  Constitutional: He is oriented to person, place, and time.  He appears well-developed and well-nourished. No distress.  HENT:  Mouth/Throat: No oropharyngeal exudate.  Eyes: No scleral icterus.  Neck: Normal range of motion. Neck supple.  Cardiovascular: Normal rate, regular rhythm, normal heart sounds and intact distal pulses.  Exam reveals no gallop and no friction rub.   No murmur heard. Pulmonary/Chest: Effort normal and breath sounds normal. No respiratory distress. He has no wheezes. He has no rales. He exhibits no tenderness.  Abdominal: Soft. Bowel sounds are normal. He exhibits no distension and no mass. There is no tenderness. There is no rebound and no guarding.  Musculoskeletal: Normal range of motion. He exhibits no edema or tenderness.  Neurological: He is oriented to person, place, and time.  Skin: Skin is warm and dry. No rash noted. He is not diaphoretic. No erythema. No pallor.  Vitals reviewed.   Lab Results  Component Value Date   WBC 6.1 02/16/2015   HGB 15.8 02/16/2015   HCT 46.2 02/16/2015   PLT 282.0 02/16/2015   GLUCOSE 99 02/16/2015   CHOL 120 02/16/2015   TRIG 41.0 02/16/2015   HDL 48.50 02/16/2015   LDLCALC 63 02/16/2015   ALT 45 02/16/2015   AST 26 02/16/2015   NA 138 02/16/2015   K 4.5 02/16/2015   CL 102 02/16/2015   CREATININE 1.01 02/16/2015   BUN 18 02/16/2015   CO2 30 02/16/2015   TSH 1.27 02/16/2015    No results found.  Assessment & Plan:   [name] was seen today  for gastrophageal reflux.  Diagnoses and all orders for this visit:  Gastroesophageal reflux disease with esophagitis  Scalp mass  I am having [name] maintain his omeprazole, hyoscyamine, and cetirizine.  Meds ordered this encounter  Medications  . cetirizine (ZYRTEC) 10 MG tablet    Sig: Take 10 mg by mouth daily.     Follow-up: Return in about 6 months (around 09/16/2015).  Sanda Lingerhomas Ellijah Leffel, MD

## 2015-04-08 ENCOUNTER — Encounter: Payer: Self-pay | Admitting: Gastroenterology

## 2015-05-01 ENCOUNTER — Emergency Department (HOSPITAL_COMMUNITY)
Admission: EM | Admit: 2015-05-01 | Discharge: 2015-05-01 | Disposition: A | Discharge status: Home / Self Care | Payer: BLUE CROSS/BLUE SHIELD | Attending: Emergency Medicine | Admitting: Emergency Medicine

## 2015-05-01 ENCOUNTER — Encounter (HOSPITAL_COMMUNITY): Payer: Self-pay

## 2015-05-01 DIAGNOSIS — K219 Gastro-esophageal reflux disease without esophagitis: Secondary | ICD-10-CM | POA: Insufficient documentation

## 2015-05-01 DIAGNOSIS — R197 Diarrhea, unspecified: Secondary | ICD-10-CM

## 2015-05-01 DIAGNOSIS — K529 Noninfective gastroenteritis and colitis, unspecified: Secondary | ICD-10-CM | POA: Diagnosis not present

## 2015-05-01 DIAGNOSIS — R112 Nausea with vomiting, unspecified: Secondary | ICD-10-CM | POA: Diagnosis present

## 2015-05-01 DIAGNOSIS — Z79899 Other long term (current) drug therapy: Secondary | ICD-10-CM | POA: Insufficient documentation

## 2015-05-01 HISTORY — DX: Gastro-esophageal reflux disease without esophagitis: K21.9

## 2015-05-01 LAB — CBC WITH DIFFERENTIAL/PLATELET
BASOS ABS: 0 10*3/uL (ref 0.0–0.1)
BASOS PCT: 0 % (ref 0–1)
Eosinophils Absolute: 0.1 10*3/uL (ref 0.0–0.7)
Eosinophils Relative: 1 % (ref 0–5)
HCT: 48.8 % (ref 39.0–52.0)
Hemoglobin: 16.9 g/dL (ref 13.0–17.0)
Lymphocytes Relative: 4 % — ABNORMAL LOW (ref 12–46)
Lymphs Abs: 0.4 10*3/uL — ABNORMAL LOW (ref 0.7–4.0)
MCH: 29.1 pg (ref 26.0–34.0)
MCHC: 34.6 g/dL (ref 30.0–36.0)
MCV: 84 fL (ref 78.0–100.0)
Monocytes Absolute: 0.4 10*3/uL (ref 0.1–1.0)
Monocytes Relative: 4 % (ref 3–12)
NEUTROS ABS: 9.6 10*3/uL — AB (ref 1.7–7.7)
Neutrophils Relative %: 91 % — ABNORMAL HIGH (ref 43–77)
PLATELETS: 254 10*3/uL (ref 150–400)
RBC: 5.81 MIL/uL (ref 4.22–5.81)
RDW: 12.4 % (ref 11.5–15.5)
WBC: 10.6 10*3/uL — ABNORMAL HIGH (ref 4.0–10.5)

## 2015-05-01 LAB — COMPREHENSIVE METABOLIC PANEL
ALBUMIN: 4.5 g/dL (ref 3.5–5.0)
ALK PHOS: 43 U/L (ref 38–126)
ALT: 46 U/L (ref 17–63)
ANION GAP: 11 (ref 5–15)
AST: 36 U/L (ref 15–41)
BUN: 23 mg/dL — AB (ref 6–20)
CO2: 23 mmol/L (ref 22–32)
CREATININE: 1.02 mg/dL (ref 0.61–1.24)
Calcium: 9.4 mg/dL (ref 8.9–10.3)
Chloride: 104 mmol/L (ref 101–111)
GFR calc Af Amer: >60 mL/min (ref >60)
GFR calc non Af Amer: >60 mL/min (ref >60)
GLUCOSE: 124 mg/dL — AB (ref 65–99)
Potassium: 4.2 mmol/L (ref 3.5–5.1)
Sodium: 138 mmol/L (ref 135–145)
TOTAL PROTEIN: 7.8 g/dL (ref 6.5–8.1)
Total Bilirubin: 1.3 mg/dL — ABNORMAL HIGH (ref 0.3–1.2)

## 2015-05-01 LAB — URINALYSIS, ROUTINE W REFLEX MICROSCOPIC
Bilirubin Urine: NEGATIVE
Glucose, UA: NEGATIVE mg/dL
Hgb urine dipstick: NEGATIVE
Ketones, ur: 40 mg/dL — AB
Leukocytes, UA: NEGATIVE
NITRITE: NEGATIVE
Protein, ur: NEGATIVE mg/dL
SPECIFIC GRAVITY, URINE: 1.028 (ref 1.005–1.030)
Urobilinogen, UA: 0.2 mg/dL (ref 0.0–1.0)
pH: 8 (ref 5.0–8.0)

## 2015-05-01 MED ORDER — ONDANSETRON 8 MG PO TBDP: 8.0000 mg | ORAL_TABLET | Freq: Three times a day (TID) | ORAL | Status: DC | PRN

## 2015-05-01 MED ORDER — ONDANSETRON HCL 4 MG/2ML IJ SOLN
4.0000 mg | Freq: Once | INTRAMUSCULAR | Status: AC
  Administered 2015-05-01: 4 mg via INTRAVENOUS
  Filled 2015-05-01: qty 2

## 2015-05-01 MED ORDER — FENTANYL CITRATE (PF) 100 MCG/2ML IJ SOLN
50.0000 ug | Freq: Once | INTRAMUSCULAR | Status: AC
  Administered 2015-05-01: 50 ug via INTRAVENOUS
  Filled 2015-05-01: qty 2

## 2015-05-01 MED ORDER — SODIUM CHLORIDE 0.9 % IV BOLUS (SEPSIS)
1000.0000 mL | Freq: Once | INTRAVENOUS | Status: AC
  Administered 2015-05-01: 1000 mL via INTRAVENOUS

## 2015-05-01 MED ORDER — DICYCLOMINE HCL 10 MG/ML IM SOLN
20.0000 mg | Freq: Once | INTRAMUSCULAR | Status: AC
  Administered 2015-05-01: 20 mg via INTRAMUSCULAR
  Filled 2015-05-01: qty 2

## 2015-05-01 NOTE — ED Notes (Signed)
Pt complains of N,V,D and general body aches since 9pm last night

## 2015-05-01 NOTE — ED Notes (Signed)
Patient is alert and oriented x3.  He was given DC instructions and follow up visit instructions.  Patient gave verbal understanding.  He was DC ambulatory under his own power to home.  V/S stable.  He was not showing any signs of distress on DC 

## 2015-05-01 NOTE — Discharge Instructions (Signed)
Take medications for nausea/vomiting as needed.  Drink plenty of fluids.  Stick to bland diet.  Return to the ER for worsening pain, vomiting despite medication, fevers, or new symptoms.  Follow up with your doctor in 3-5 days for recheck.  If you do not have a doctor, see numbers listed to set up a primary care physician. ° ° °GI ANTIEMETIC ° °GI ANTIEMETIC: °You have been given a prescription for a medication for nausea and vomiting. ° ° ·   It is OK to take this medication if you are pregnant.  Be sure to tell your regular doctor or obstetrician (OB doctor) that you have been taking this medication. ° ·   Take this medication as directed. ° ·   If you are taking phenobarbital, narcotic pain medications, antidepressants, or sleeping pills your dosage may need to be adjusted.  Be sure to inform your doctor of all the other medications that you are taking. ° ·   DO NOT take this medication if you have liver disease or heart disease. ° ·   DO NOT take pain killers (narcotic medication) unless specifically instructed to do so by your doctor ° ·   DO NOT drink alcoholic beverages while taking this medicine. ° ·   If you develop any reactions that you believe may be from the medication be sure to tell your doctor or return to the ER (Some reactions may include:  dizziness, shaking, visual disturbances, nervousness, fainting, rash). ° ·   If you become dizzy, sit or lie down at the first signs.  You should be careful going up and down stairs. ° ·   Keep this medication out of the reach of children.  Always keep this medication in child-proof containers.  DO NOT give your medication to anyone else. °You have been given a medication, or a prescription for a medication, that causes drowsiness or dizziness.  DO NOT drive a car, operate machinery, ride a bike, or perform jobs that require you to be alert until you know how you are going to react to this medication. ° °THESE INSTRUCTIONS ARE NOT COMPREHENSIVE (complete):  Ask  your pharmacist for additional information and precautions for this medication. ° ° °VOMITING ° °VOMITING: °You have been seen for vomiting. ° °Vomiting (throwing-up) can be caused by many different things. Most of the time the cause IS NOT serious and it is safe to send someone home when they have been vomiting. ° °Some common causes of vomiting include the following: ° ° ·   Gastroenteritis (stomach flu). Diarrhea usually also occurs with "stomach flu." ° ·   Other illnesses. Sometimes other medical conditions such as diabetes, heart problems, headaches, and painful conditions can make someone throw-up. Certain infections, even in other parts of the body, can cause vomiting. ° ·   Bowel obstructions (blockages) can cause vomiting along with the inability to have bowel movements (stool) or pass gas. ° ·   Vomiting can also be a symptom of appendicitis, especially if there is also pain in the right lower abdomen (belly). °The treatment for vomiting is to try to find the cause for the vomiting and make it better. Sometimes the cause can't be easily found. Sometimes vomiting is treated with anti-nausea medicines like Phenergan (promethazine), Compazine (prochlorperazine) or Zofran (ondansetron). ° °You should try to drink liquids to avoid dehydration.  Rather than drinking a lot of fluid all at once, you should take small sips continuously throughout the day. ° °YOU SHOULD SEEK   MEDICAL ATTENTION IMMEDIATELY, EITHER HERE OR AT THE NEAREST EMERGENCY DEPARTMENT, IF ANY OF THE FOLLOWING OCCURS:      Persistent vomiting that is not relieved with medication.     Inability to keep liquids down.     Severe sudden chest or belly pain after vomiting.     Abdominal pain.  NAUSEA  NAUSEA: You have been seen today for nausea.  Nausea is the medical word that means that you feel that you are going to throw up (vomit). Nausea is not a disease but rather it is a symptom of another problem. For example, nausea,  vomiting and diarrhea are symptoms of a stomach virus (the "stomach flu").  The feeling of nausea may also go along with actually vomiting (throwing up).  The symptom of nausea means that there is some other problem or disease causing the nausea. The nausea itself is not dangerous but it can be very uncomfortable.  Many causes of nausea can not be determined by the tests available in an Urgent Care or Emergency Department. It is VERY IMPORTANT to follow up with your doctor for on going care and evaluation.  There are a number of treatments available for nausea. These can be discussed with the medical staff along with treatment of the illness causing the nausea. Some of the more common medications (given by prescription) that are used to help with nausea include the following:      Promethazine (Phenergan), Prochlorperazine (Compazine), Metoclopramide (Reglan), Ondansetron (Zofran), and many others. In order to avoid worsening your nausea and causing you to vomit, it is recommended that you follow a clear liquid diet (such as water, clear broth, 7-up, Sprite, other clear caffeine-free soft drinks, or sports drinks) until you feel better.  It is STRONGLY RECOMMENDED that if your nausea continues for longer than a few days, or other new symptoms arise, that you be reevaluated by your family doctor, specialist or clinic. If you cannot obtain this follow-up care you can always return here or go to the nearest Emergency Department to be seen again.  YOU SHOULD SEEK MEDICAL ATTENTION IMMEDIATELY, EITHER HERE OR AT THE NEAREST EMERGENCY DEPARTMENT, IF ANY OF THE FOLLOWING OCCURS:      Repeated, continuous vomiting.     Abdominal pain.     Vomiting blood or anything that looks like coffee grounds in your vomit.     Headache.     Any new, concerning symptoms that appear after you are discharged from here today.     Any worsening of the general feeling of unwellness.   DIARRHEA  DIARRHEA: You  have been diagnosed with diarrhea.  Diarrhea occurs when you have an increase in the amount or frequency of stools (bowel movements) and/or your stools become very soft or completely liquid. You may also have stomach cramps/ pains, nausea, vomiting and fever.  Diarrhea can be caused by bacteria, viruses and by parasites. Those who have recently returned from travel to other countries may have a bacterial cause of diarrhea called "travelers diarrhea."  The main treatments for diarrhea include hydration with fluids so you don't become dehydrated from the loss of body fluids. You may also be given medicines to slow the diarrhea and stop the vomiting (if you have this symptom) .  You should drink lots of natural juices or a sports-hydration type drink that contains electrolytes. Avoid drinking a large amount of plain water. Sugary fluids and juices such as apple and pear may worsen the diarrhea.  Depending on  your age, medical history and symptoms, you may be prescribed medications to help with the nausea, vomiting, stomach cramps, and the diarrhea itself.  At this time it has been determined it is safe for you to be discharged.  Good hygiene is important to avoid spread of this disease. Please wash your hands thoroughly and frequently with soap and water, especially after using the bathroom. Avoid food preparation and the sharing of utensils or food and beverages with others.  Follow-up with your primary care doctor or a specialist is very important. Please do so within the time frame specified by the physician.  YOU SHOULD SEEK MEDICAL ATTENTION IMMEDIATELY, EITHER HERE OR AT THE NEAREST EMERGENCY DEPARTMENT, IF ANY OF THE FOLLOWING OCCURS:      Vomiting and inability to keep down fluids.     Blood, pus or mucous in your stool.     Signs of dehydration such as dry mouth, not urinating at least once every eight hours, dizziness/lightheadedness, severe weakness or passing out.  B.R.A.T.  DIET  B.R.A.T. Diet Your doctor has recommended the B.R.A.T. diet for your child until his or her condition improves. This is often used to help control diarrhea and vomiting symptoms. If your child can tolerate clear liquids, you may offer:       Bananas       Rice       Applesauce       Toast (and other simple starches such as crackers, potatoes, noodles)   Be sure to avoid dairy products, meats, and fatty foods until your child's  symptoms are completely better.

## 2015-05-01 NOTE — ED Notes (Signed)
Pt given sprite and saltines. Told to make staff aware if unable to keep them down.

## 2015-05-01 NOTE — ED Notes (Signed)
Pt given urinal to give sample. Unable to give sample at this time. Will notify staff when he is able.

## 2015-05-01 NOTE — ED Provider Notes (Signed)
CSN: [id number]     Arrival date & time 05/01/15  0439 History   First MD Initiated Contact with Patient 05/01/15 0501     Chief Complaint  Patient presents with  . 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  He denies any unusual foods, no sick contacts that he knows of, no.  Patient reports he had a bug bite with an associated rash, which is since resolved that started a week ago.  He did not see a tick, but reports there are tics at home.  He denies any fever or chills, no neck stiffness.  Patient has history of GERD Past Medical History  Diagnosis Date  . GERD (gastroesophageal reflux disease)    History reviewed. No pertinent past surgical history. Family History  Problem Relation Age of Onset  . Breast cancer Maternal Grandmother   . Heart disease Father     valve replacement  . Hypertension Father    History  Substance Use Topics  . Smoking status: Current Every Day Smoker -- 1.00 packs/day for 10 years    Types: E-cigarettes, Cigarettes    Last Attempt to Quit: 11/24/2012  . Smokeless tobacco: Never Used  . Alcohol Use: 0.0 oz/week    0 Standard drinks or equivalent per week     Comment: occasional    Review of Systems   See History of Present Illness; otherwise all other systems are reviewed and negative  Allergies  Review of patient's allergies indicates no known allergies.  Home Medications   Prior to Admission medications   Medication Sig Start Date End Date Taking? Authorizing Provider  calcium carbonate (TUMS - DOSED IN MG ELEMENTAL CALCIUM) 500 MG chewable tablet Chew 1 tablet by mouth 3 (three) times daily as needed for indigestion or heartburn.   Yes Historical Provider, MD  cetirizine (ZYRTEC) 10 MG  tablet Take 10 mg by mouth daily.   Yes Historical Provider, MD  omeprazole (PRILOSEC) 40 MG capsule Take 1 capsule (40 mg total) by mouth daily. Patient taking differently: Take 40 mg by mouth 2 (two) times daily as needed (for acid reflux).  09/22/14  Yes Louis Meckel, MD  psyllium (METAMUCIL SMOOTH TEXTURE) 28 % packet Take 1 packet by mouth daily.   Yes Historical Provider, MD  ranitidine (ZANTAC) 75 MG tablet Take 75 mg by mouth 2 (two) times daily as needed for heartburn (for acid reflux).   Yes Historical Provider, MD  hyoscyamine (LEVBID) 0.375 MG 12 hr tablet Take 1 tablet (0.375 mg total) by mouth 2 (two) times daily. Patient not taking: Reported on 05/01/2015 02/16/15   Etta Grandchild, MD   BP 121/87 mmHg  Pulse 92  Temp(Src) 97.9 F (36.6 C) (Oral)  Resp 20  Ht  (1.753 m)  Wt 160 lb (72.576 kg)  BMI 23.62 kg/m2  SpO2 100% Physical Exam  Constitutional: He is oriented to person, place, and time. He appears well-developed and well-nourished.  Uncomfortable appearing  HENT:  Head: Normocephalic and atraumatic.  Nose: Nose normal.  Mouth/Throat: Oropharynx is clear and moist.  Eyes: Conjunctivae and EOM are normal. Pupils are equal, round, and reactive to light.  Neck: Normal range of motion. Neck supple. No JVD present. No tracheal deviation present. No thyromegaly present.  Cardiovascular: Normal rate, regular rhythm, normal heart  sounds and intact distal pulses.  Exam reveals no gallop and no friction rub.   No murmur heard. Pulmonary/Chest: Effort normal and breath sounds normal. No stridor. No respiratory distress. He has no wheezes. He has no rales. He exhibits no tenderness.  Abdominal: Soft. Bowel sounds are normal. He exhibits no distension and no mass. There is no tenderness. There is no rebound and no guarding.  Hyperactive bowel sounds, mildly diffusely tender  Musculoskeletal: Normal range of motion. He exhibits no edema or tenderness.  Lymphadenopathy:     He has no cervical adenopathy.  Neurological: He is alert and oriented to person, place, and time. He displays normal reflexes. He exhibits normal muscle tone. Coordination normal.  Skin: Skin is warm and dry. No rash noted. No erythema. There is pallor.  Psychiatric: He has a normal mood and affect. His behavior is normal. Judgment and thought content normal.  Nursing note and vitals reviewed.   ED Course  Procedures (including critical care time) Labs Review Labs Reviewed  COMPREHENSIVE METABOLIC PANEL - Abnormal; Notable for the following:    Glucose, Bld 124 (*)    BUN 23 (*)    Total Bilirubin 1.3 (*)    All other components within normal limits  CBC WITH DIFFERENTIAL/PLATELET - Abnormal; Notable for the following:    WBC 10.6 (*)    Neutrophils Relative % 91 (*)    Neutro Abs 9.6 (*)    Lymphocytes Relative 4 (*)    Lymphs Abs 0.4 (*)    All other components within normal limits  URINALYSIS, ROUTINE W REFLEX MICROSCOPIC (NOT AT [facility]) - Abnormal; Notable for the following:    Ketones, ur 40 (*)    All other components within normal limits    Imaging Review No results found.   EKG Interpretation None      MDM   Final diagnoses:  Nausea vomiting and diarrhea  Gastroenteritis    29 year old male with nausea, vomiting, diarrhea.  Suspect viral gastroenteritis, food poisoning, and bacterial sources is on differential as well.  Abdomen is diffusely tender, no focality.  Doubt appendicitis or other surgical issue.  Plan for labs, IV fluids.  Pt feeling better.  Ready for d/c    Marisa Severinlga Ethelmae Ringel, MD 05/01/15 [phone]

## 2015-05-01 NOTE — ED Notes (Signed)
Pt currently able to tolerate PO intake

## 2015-05-05 ENCOUNTER — Telehealth: Payer: Self-pay

## 2015-05-05 ENCOUNTER — Other Ambulatory Visit: Payer: Self-pay

## 2015-05-05 DIAGNOSIS — K2100 Gastro-esophageal reflux disease with esophagitis, without bleeding: Secondary | ICD-10-CM

## 2015-05-05 NOTE — Telephone Encounter (Signed)
-----   Message from Louis Meckelobert D Kaplan, MD sent at 05/05/2015  9:44 AM EDT ----- Please reschedule this patient's endoscopy (he is scheduled sometime in August)  to 04/2515 at 12:30 in the hospital with conscious sedation, if possible.

## 2015-05-05 NOTE — Telephone Encounter (Signed)
Patient contacted and scheduled. Prep information sent through My Chart and printed out to be mailed.

## 2015-05-12 ENCOUNTER — Encounter (HOSPITAL_COMMUNITY): Payer: Self-pay | Admitting: *Deleted

## 2015-05-18 ENCOUNTER — Ambulatory Visit (HOSPITAL_COMMUNITY): Payer: BLUE CROSS/BLUE SHIELD | Admitting: Anesthesiology

## 2015-05-18 ENCOUNTER — Encounter (HOSPITAL_COMMUNITY): Payer: Self-pay | Admitting: *Deleted

## 2015-05-18 ENCOUNTER — Encounter (HOSPITAL_COMMUNITY)
Admission: RE | Disposition: A | Discharge status: Home / Self Care | Payer: Self-pay | Source: Ambulatory Visit | Attending: Gastroenterology

## 2015-05-18 ENCOUNTER — Ambulatory Visit (HOSPITAL_COMMUNITY)
Admission: RE | Admit: 2015-05-18 | Discharge: 2015-05-18 | Disposition: A | Discharge status: Home / Self Care | Payer: BLUE CROSS/BLUE SHIELD | Source: Ambulatory Visit | Attending: Gastroenterology | Admitting: Gastroenterology

## 2015-05-18 DIAGNOSIS — Z87891 Personal history of nicotine dependence: Secondary | ICD-10-CM | POA: Diagnosis not present

## 2015-05-18 DIAGNOSIS — K2100 Gastro-esophageal reflux disease with esophagitis, without bleeding: Secondary | ICD-10-CM

## 2015-05-18 DIAGNOSIS — K219 Gastro-esophageal reflux disease without esophagitis: Secondary | ICD-10-CM | POA: Insufficient documentation

## 2015-05-18 DIAGNOSIS — R0789 Other chest pain: Secondary | ICD-10-CM | POA: Diagnosis present

## 2015-05-18 DIAGNOSIS — R12 Heartburn: Secondary | ICD-10-CM | POA: Diagnosis present

## 2015-05-18 HISTORY — DX: Sebaceous cyst: L72.3

## 2015-05-18 HISTORY — PX: ESOPHAGOGASTRODUODENOSCOPY: SHX5428

## 2015-05-18 SURGERY — EGD (ESOPHAGOGASTRODUODENOSCOPY)
Anesthesia: Monitor Anesthesia Care

## 2015-05-18 MED ORDER — LACTATED RINGERS IV SOLN
INTRAVENOUS | Status: DC
  Administered 2015-05-18: 1000 mL via INTRAVENOUS

## 2015-05-18 MED ORDER — LIDOCAINE HCL (PF) 2 % IJ SOLN
INTRAMUSCULAR | Status: DC | PRN
  Administered 2015-05-18: 20 mg via INTRADERMAL

## 2015-05-18 MED ORDER — LIDOCAINE HCL (CARDIAC) 20 MG/ML IV SOLN
INTRAVENOUS | Status: AC
  Filled 2015-05-18: qty 5

## 2015-05-18 MED ORDER — SODIUM CHLORIDE 0.9 % IV SOLN: INTRAVENOUS | Status: DC

## 2015-05-18 MED ORDER — PROPOFOL 10 MG/ML IV BOLUS
INTRAVENOUS | Status: AC
  Filled 2015-05-18: qty 20

## 2015-05-18 MED ORDER — PROPOFOL 10 MG/ML IV BOLUS
INTRAVENOUS | Status: DC | PRN
  Administered 2015-05-18: 150 mg via INTRAVENOUS
  Administered 2015-05-18: 50 mg via INTRAVENOUS

## 2015-05-18 MED ORDER — LACTATED RINGERS IV SOLN
INTRAVENOUS | Status: DC
  Administered 2015-05-18: 11:00:00 via INTRAVENOUS

## 2015-05-18 MED ORDER — CEFAZOLIN SODIUM-DEXTROSE 2-3 GM-% IV SOLR
2.0000 g | INTRAVENOUS | Status: DC
  Filled 2015-05-18: qty 50

## 2015-05-18 MED ORDER — DEXLANSOPRAZOLE 60 MG PO CPDR: 60.0000 mg | DELAYED_RELEASE_CAPSULE | Freq: Every day | ORAL | Status: DC

## 2015-05-18 NOTE — Op Note (Signed)
[facility] [address] [place], [address]   ENDOSCOPY PROCEDURE REPORT  PATIENT: Bernard [name], Deis  MR#: [id number] BIRTHDATE: 03-21-1986 , 28  yrs. old GENDER: male ENDOSCOPIST: Louis Meckel, MD REFERRED BY:  Etta Grandchild, M.D. PROCEDURE DATE:  05/18/2015 PROCEDURE:  EGD, diagnostic ASA CLASS:     Class I INDICATIONS:  heartburn. MEDICATIONS: Monitored anesthesia care TOPICAL ANESTHETIC:  DESCRIPTION OF PROCEDURE: After the risks benefits and alternatives of the procedure were thoroughly explained, informed consent was obtained.  The Pentax Gastroscope [id number] endoscope was introduced through the mouth and advanced to the second portion of the duodenum , Without limitations.  The instrument was slowly withdrawn as the mucosa was fully examined.      EXAM: The esophagus and gastroesophageal junction were completely normal in appearance.  The stomach was entered and closely examined.The antrum, angularis, and lesser curvature were well visualized, including a retroflexed view of the cardia and fundus. The stomach wall was normally distensable.  The scope passed easily through the pylorus into the duodenum.  Retroflexed views revealed no abnormalities.     The scope was then withdrawn from the patient and the procedure completed.  COMPLICATIONS: There were no immediate complications.  ENDOSCOPIC IMPRESSION: Normal appearing esophagus and GE junction, the stomach was well visualized and normal in appearance, normal appearing duodenum  RECOMMENDATIONS: Trial of dexilant 60 mg every morning  REPEAT EXAM:  eSigned:  Louis Meckel, MD 05/18/2015 12:48 PM    CC:

## 2015-05-18 NOTE — Transfer of Care (Signed)
Immediate Anesthesia Transfer of Care Note  Patient: Bernard [name]  Procedure(s) Performed: Procedure(s): ESOPHAGOGASTRODUODENOSCOPY (EGD) (N/A)  Patient Location: PACU  Anesthesia Type:MAC  Level of Consciousness:  sedated, patient cooperative and responds to stimulation  Airway & Oxygen Therapy:Patient Spontanous Breathing and Patient connected to face mask oxgen  Post-op Assessment:  Report given to PACU RN and Post -op Vital signs reviewed and stable  Post vital signs:  Reviewed and stable  Last Vitals:  Filed Vitals:   05/18/15 1108  Temp: 36.5 C    Complications: No apparent anesthesia complications

## 2015-05-18 NOTE — Anesthesia Preprocedure Evaluation (Signed)
Anesthesia Evaluation  Patient identified by MRN, date of birth, ID band Patient awake    Reviewed: Allergy & Precautions, NPO status , Patient's Chart, lab work & pertinent test results  History of Anesthesia Complications Negative for: history of anesthetic complications  Airway Mallampati: I  TM Distance: >3 FB Neck ROM: Full    Dental  (+) Teeth Intact   Pulmonary neg shortness of breath, neg sleep apnea, neg COPDneg recent URI, former smoker, neg PE breath sounds clear to auscultation        Cardiovascular negative cardio ROS  Rhythm:Regular     Neuro/Psych negative neurological ROS  negative psych ROS   GI/Hepatic Neg liver ROS, GERD-  Medicated and Poorly Controlled,  Endo/Other  negative endocrine ROS  Renal/GU negative Renal ROS     Musculoskeletal   Abdominal   Peds  Hematology negative hematology ROS (+)   Anesthesia Other Findings   Reproductive/Obstetrics                             Anesthesia Physical Anesthesia Plan  ASA: II  Anesthesia Plan: MAC   Post-op Pain Management:    Induction: Intravenous  Airway Management Planned: Nasal Cannula  Additional Equipment: None  Intra-op Plan:   Post-operative Plan:   Informed Consent: I have reviewed the patients History and Physical, chart, labs and discussed the procedure including the risks, benefits and alternatives for the proposed anesthesia with the patient or authorized representative who has indicated his/her understanding and acceptance.   Dental advisory given  Plan Discussed with: CRNA and Surgeon  Anesthesia Plan Comments:         Anesthesia Quick Evaluation

## 2015-05-18 NOTE — H&P (Signed)
               _                                                                                                                History of Present Illness:  Bernard [name] is a 29 year old white male with history of GERD here for endoscopy.  Despite taking PPI therapy he has frequent breakthrough pyrosis and chest discomfort.  He denies dysphagia.   Past Medical History  Diagnosis Date  . GERD (gastroesophageal reflux disease)   . Sebaceous cyst    History reviewed. No pertinent past surgical history. family history includes Breast cancer in his maternal grandmother; Heart disease in his father; Hypertension in his father. Current Facility-Administered Medications  Medication Dose Route Frequency Provider Last Rate Last Dose  . 0.9 %  sodium chloride infusion   Intravenous Continuous Louis Meckel, MD      . ceFAZolin (ANCEF) IVPB 2 g/50 mL premix  2 g Intravenous On Call to OR Abigail Miyamoto, MD      . lactated ringers infusion   Intravenous Continuous Louis Meckel, MD      . lactated ringers infusion   Intravenous Continuous Louis Meckel, MD 125 mL/hr at 05/18/15 1130 1,000 mL at 05/18/15 1130   Allergies as of 05/05/2015  . (No Known Allergies)    reports that he quit smoking about 2 years ago. His smoking use included E-cigarettes and Cigarettes. He has a 10 pack-year smoking history. He has never used smokeless tobacco. He reports that he drinks alcohol. He reports that he does not use illicit drugs.   Review of Systems: Pertinent positive and negative review of systems were noted in the above HPI section. All other review of systems were otherwise negative.  Vital signs were reviewed in today's medical record Physical Exam: General: Well developed , well nourished, no acute distress Skin: anicteric Head: Normocephalic and atraumatic Eyes:  sclerae anicteric, EOMI Ears: Normal auditory acuity Mouth: No deformity or lesions Neck: Supple, no masses or  thyromegaly Lymph Nodes: no lymphadenopathy Lungs: Clear throughout to auscultation Heart: Regular rate and rhythm; no murmurs, rubs or bruits Gastroinestinal: Soft, non tender and non distended. No masses, hepatosplenomegaly or hernias noted. Normal Bowel sounds Rectal:deferred Musculoskeletal: Symmetrical with no gross deformities  Skin: No lesions on visible extremities Pulses:  Normal pulses noted Extremities: No clubbing, cyanosis, edema or deformities noted Neurological: Alert oriented x 4, grossly nonfocal Cervical Nodes:  No significant cervical adenopathy Inguinal Nodes: No significant inguinal adenopathy Psychological:  Alert and cooperative. Normal mood and affect  Impression-GERD refractory to PPI therapy  Plan-EGD; if negative will proceed with 48 hour bravo pH study with impedance plethysmography

## 2015-05-18 NOTE — Discharge Instructions (Signed)
His continue omeprazoleEsophagogastroduodenoscopy Care After Refer to this sheet in the next few weeks. These instructions provide you with information on caring for yourself after your procedure. Your caregiver may also give you more specific instructions. Your treatment has been planned according to current medical practices, but problems sometimes occur. Call your caregiver if you have any problems or questions after your procedure.  HOME CARE INSTRUCTIONS  Do not eat or drink anything until the numbing medicine (local anesthetic) has worn off and your gag reflex has returned. You will know that the local anesthetic has worn off when you can swallow comfortably.  Do not drive for 12 hours after the procedure or as directed by your caregiver.  Only take medicines as directed by your caregiver. SEEK MEDICAL CARE IF:   You cannot stop coughing.  You are not urinating at all or less than usual. SEEK IMMEDIATE MEDICAL CARE IF:  You have difficulty swallowing.  You cannot eat or drink.  You have worsening throat or chest pain.  You have dizziness, lightheadedness, or you faint.  You have nausea or vomiting.  You have chills.  You have a fever.  You have severe abdominal pain.  You have black, tarry, or bloody stools. Document Released: 09/26/2012 Document Reviewed: 09/26/2012 [facility] Patient Information 2015 [place], [place]. This information is not intended to replace advice given to you by your health care provider. Make sure you discuss any questions you have with your health care provider.

## 2015-05-19 ENCOUNTER — Encounter: Payer: BLUE CROSS/BLUE SHIELD | Admitting: Gastroenterology

## 2015-05-19 ENCOUNTER — Encounter (HOSPITAL_COMMUNITY): Payer: Self-pay | Admitting: Gastroenterology

## 2015-05-19 NOTE — Anesthesia Postprocedure Evaluation (Signed)
  Anesthesia Post-op Note  Patient: Bernard [name]  Procedure(s) Performed: Procedure(s): ESOPHAGOGASTRODUODENOSCOPY (EGD) (N/A)  Patient Location: Endoscopy Unit  Anesthesia Type:MAC  Level of Consciousness: awake  Airway and Oxygen Therapy: Patient Spontanous Breathing  Post-op Pain: none  Post-op Assessment: Post-op Vital signs reviewed, Patient's Cardiovascular Status Stable, Respiratory Function Stable, Patent Airway, No signs of Nausea or vomiting and Pain level controlled              Post-op Vital Signs: Reviewed and stable  Last Vitals:  Filed Vitals:   05/18/15 1300  BP: 117/73  Pulse: 58  Temp:   Resp: 16    Complications: No apparent anesthesia complications

## 2015-05-25 ENCOUNTER — Telehealth: Payer: Self-pay | Admitting: Gastroenterology

## 2015-05-25 NOTE — Telephone Encounter (Signed)
He should be taking dexalant 1-2 times a day for 2 weeks, then he should call back to report his progress.  If no better he will need 48hr pH study

## 2015-05-25 NOTE — Telephone Encounter (Signed)
Pt calling wanting to know what the next step is regarding his care following the EGD he had. Please advise.

## 2015-06-04 NOTE — Telephone Encounter (Signed)
Patient provided an update on his condition.  On dexilant twice a day heartburn has significantly improved.  Plan on continuing twice a day dexilant for 2-3 weeks and then trying to reduce to once a day.

## 2015-06-05 ENCOUNTER — Encounter: Payer: BLUE CROSS/BLUE SHIELD | Admitting: Gastroenterology

## 2015-07-17 ENCOUNTER — Telehealth: Payer: Self-pay | Admitting: Gastroenterology

## 2015-07-17 MED ORDER — DEXLANSOPRAZOLE 60 MG PO CPDR: 60.0000 mg | DELAYED_RELEASE_CAPSULE | Freq: Two times a day (BID) | ORAL | Status: DC

## 2015-07-17 NOTE — Telephone Encounter (Signed)
Patient states that he is out of Dexilant and is wanting to know what he should do until we hear back from his insurance. Best # [phone]

## 2015-07-17 NOTE — Telephone Encounter (Signed)
Karen Kitchens is working on Prior Authoization through cover my meds

## 2015-07-17 NOTE — Telephone Encounter (Signed)
L/M for patient that I was going to call [facility] to refill his dexilant  [facility] automated system not working cant get through to a real person. Sent script of Dexilant twice a day electronically

## 2015-07-20 NOTE — Telephone Encounter (Signed)
Dottie, Did you hear anything on this yet? Dr Arlyce Dice is out. Who is doc of the day over there today?

## 2015-07-20 NOTE — Telephone Encounter (Signed)
Ok [employer] Ill call the patient in a bit

## 2015-07-20 NOTE — Telephone Encounter (Signed)
Patient informed med approved and to contact his pharmacy

## 2015-07-20 NOTE — Telephone Encounter (Signed)
Per CoverMyMeds.com, patient's Dexilant has been approved. DOD today is [name].

## 2015-09-04 ENCOUNTER — Telehealth: Payer: Self-pay | Admitting: Gastroenterology

## 2015-09-04 NOTE — Telephone Encounter (Signed)
Per pt he would like to continue his care with Dr. Leone PayorGessner now that Dr. Arlyce DiceKaplan isn't here. Will you accept?

## 2015-09-08 ENCOUNTER — Encounter: Payer: Self-pay | Admitting: Internal Medicine

## 2015-09-08 NOTE — Telephone Encounter (Signed)
OK 

## 2015-09-14 NOTE — Telephone Encounter (Signed)
Spoke to pt he will call to schedule with Dr. Leone PayorGessner when he is ready.

## 2015-12-23 ENCOUNTER — Ambulatory Visit (INDEPENDENT_AMBULATORY_CARE_PROVIDER_SITE_OTHER): Payer: 59 | Admitting: Internal Medicine

## 2015-12-23 ENCOUNTER — Encounter: Payer: Self-pay | Admitting: Internal Medicine

## 2015-12-23 VITALS — BP 110/74 | HR 72 | Ht 69.0 in | Wt 168.2 lb

## 2015-12-23 DIAGNOSIS — K219 Gastro-esophageal reflux disease without esophagitis: Secondary | ICD-10-CM | POA: Diagnosis not present

## 2015-12-23 DIAGNOSIS — K641 Second degree hemorrhoids: Secondary | ICD-10-CM | POA: Insufficient documentation

## 2015-12-23 DIAGNOSIS — K649 Unspecified hemorrhoids: Secondary | ICD-10-CM | POA: Insufficient documentation

## 2015-12-23 MED ORDER — DEXLANSOPRAZOLE 60 MG PO CPDR: 60.0000 mg | DELAYED_RELEASE_CAPSULE | Freq: Every day | ORAL | Status: DC

## 2015-12-23 NOTE — Patient Instructions (Signed)
  We have sent the following prescriptions to your mail in pharmacy:  dexilant If you have not heard from your mail in pharmacy within 1 week or if you have not received your medication in the mail, please contact us at [phone] so we may find out why.    I appreciate the opportunity to care for you. Stan Head, MD, [facility]

## 2015-12-23 NOTE — Progress Notes (Signed)
   Subjective:    Patient ID: Bernard [name], male    DOB: 1985/12/08, 30 y.o.   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 He was previously a patient of Dr. Marzetta Board. He's had some difficulty in controlling reflux but he is on Dexilant history milligrams daily with good control overall. No dysphagia. He's had endoscopy in the past with normal findings.  Medications, allergies, past medical history, past surgical history, family history and social history are reviewed and updated in the EMR.  Review of Systems As above    Objective:   Physical Exam BP 110/74 mmHg  Pulse 72  Ht  (1.753 m)  Wt 168 lb 3.2 oz (76.295 kg)  BMI 24.83 kg/m2 Well-developed well-nourished no acute distress    Assessment & Plan:   1. Gastroesophageal reflux disease without esophagitis    Continue Dexilant is doing well I will see him every one to 2 years versus getting the PPI through his PCP which is fine. He could also just see me as needed.

## 2015-12-23 NOTE — Assessment & Plan Note (Signed)
Doing well - continue Dexilant

## 2016-03-30 ENCOUNTER — Telehealth: Payer: Self-pay | Admitting: Emergency Medicine

## 2016-03-30 MED ORDER — DEXLANSOPRAZOLE 60 MG PO CPDR: 60.0000 mg | DELAYED_RELEASE_CAPSULE | Freq: Every day | ORAL | Status: DC

## 2016-03-30 NOTE — Telephone Encounter (Signed)
Fax sent from [facility] for refill of Dexilant.

## 2016-04-13 ENCOUNTER — Encounter: Payer: Self-pay | Admitting: Internal Medicine

## 2016-04-13 ENCOUNTER — Ambulatory Visit (INDEPENDENT_AMBULATORY_CARE_PROVIDER_SITE_OTHER): Payer: 59 | Admitting: Internal Medicine

## 2016-04-13 ENCOUNTER — Telehealth: Payer: Self-pay | Admitting: Internal Medicine

## 2016-04-13 VITALS — BP 118/78 | HR 64 | Temp 98.2°F | Resp 16 | Ht 69.0 in | Wt 162.0 lb

## 2016-04-13 DIAGNOSIS — K611 Rectal abscess: Secondary | ICD-10-CM

## 2016-04-13 DIAGNOSIS — K610 Anal abscess: Secondary | ICD-10-CM

## 2016-04-13 MED ORDER — AMOXICILLIN-POT CLAVULANATE 875-125 MG PO TABS: 1.0000 | ORAL_TABLET | Freq: Two times a day (BID) | ORAL | Status: DC

## 2016-04-13 NOTE — Patient Instructions (Signed)
Cellulitis °Cellulitis is an infection of the skin and the tissue under the skin. The infected area is usually red and tender. This happens most often in the arms and lower legs. °HOME CARE  °· Take your antibiotic medicine as told. Finish the medicine even if you start to feel better. °· Keep the infected arm or leg raised (elevated). °· Put a warm cloth on the area up to 4 times per day. °· Only take medicines as told by your doctor. °· Keep all doctor visits as told. °GET HELP IF: °· You see red streaks on the skin coming from the infected area. °· Your red area gets bigger or turns a dark color. °· Your bone or joint under the infected area is painful after the skin heals. °· Your infection comes back in the same area or different area. °· You have a puffy (swollen) bump in the infected area. °· You have new symptoms. °· You have a fever. °GET HELP RIGHT AWAY IF:  °· You feel very sleepy. °· You throw up (vomit) or have watery poop (diarrhea). °· You feel sick and have muscle aches and pains. °  °This information is not intended to replace advice given to you by your health care provider. Make sure you discuss any questions you have with your health care provider. °  °Document Released: 03/28/2008 Document Revised: 07/01/2015 Document Reviewed: 12/26/2011 °Elsevier Interactive Patient Education ©2016 [employer]vier Inc. ° °

## 2016-04-13 NOTE — Progress Notes (Signed)
Pre visit review using our clinic review tool, if applicable. No additional management support is needed unless otherwise documented below in the visit note. 

## 2016-04-13 NOTE — Telephone Encounter (Signed)
Spoke with patient and offered OV next week with APP to be examined since we have not seen him for this in past. He will call his PCP for this.

## 2016-04-13 NOTE — Progress Notes (Signed)
Subjective:  Patient ID: Bernard [name], male    DOB: Feb 14, 1986  Age: 30 y.o. MRN: [id number]  CC: Rectal Pain   HPI TORRION WITTER presents for concerns about a painful nodule just outside the left side of his anus. He has had no trouble with his bowel movements such as tenesmus or blood in stool.  Outpatient Prescriptions Prior to Visit  Medication Sig Dispense Refill  . cetirizine (ZYRTEC) 10 MG tablet Take 10 mg by mouth every morning.     Marland Kitchen dexlansoprazole (DEXILANT) 60 MG capsule Take 1 capsule (60 mg total) by mouth daily before breakfast. 90 capsule 3  . psyllium (METAMUCIL SMOOTH TEXTURE) 28 % packet Take 1 packet by mouth every morning. Reported on 04/13/2016     No facility-administered medications prior to visit.    ROS Review of Systems  Constitutional: Negative.  Negative for fever, chills and fatigue.  HENT: Negative.  Negative for trouble swallowing.   Eyes: Negative.   Respiratory: Negative.  Negative for cough and shortness of breath.   Cardiovascular: Negative.  Negative for chest pain, palpitations and leg swelling.  Gastrointestinal: Negative.  Negative for nausea, abdominal pain, diarrhea, constipation, blood in stool and rectal pain.  Endocrine: Negative.   Genitourinary: Negative.   Musculoskeletal: Negative.   Skin: Negative.   Allergic/Immunologic: Negative.   Neurological: Negative.   Hematological: Negative.  Negative for adenopathy. Does not bruise/bleed easily.  Psychiatric/Behavioral: Negative.     Objective:  BP 118/78 mmHg  Pulse 64  Temp(Src) 98.2 F (36.8 C) (Oral)  Resp 16  Ht  (1.753 m)  Wt 162 lb (73.483 kg)  BMI 23.91 kg/m2  SpO2 97%  BP Readings from Last 3 Encounters:  04/13/16 118/78  12/23/15 110/74  05/18/15 117/73    Wt Readings from Last 3 Encounters:  04/13/16 162 lb (73.483 kg)  12/23/15 168 lb 3.2 oz (76.295 kg)  05/18/15 160 lb (72.576 kg)    Physical Exam  Constitutional: He is oriented to  person, place, and time. No distress.  HENT:  Mouth/Throat: Oropharynx is clear and moist. No oropharyngeal exudate.  Eyes: Conjunctivae are normal. Right eye exhibits no discharge. Left eye exhibits no discharge. No scleral icterus.  Neck: Normal range of motion. Neck supple. No JVD present. No tracheal deviation present. No thyromegaly present.  Cardiovascular: Normal rate, regular rhythm, normal heart sounds and intact distal pulses.  Exam reveals no gallop and no friction rub.   No murmur heard. Pulmonary/Chest: Effort normal and breath sounds normal. No stridor. No respiratory distress. He has no wheezes. He has no rales. He exhibits no tenderness.  Abdominal: Soft. Bowel sounds are normal. He exhibits no distension and no mass. There is no tenderness. There is no rebound and no guarding.  Genitourinary:     Lymphadenopathy:    He has no cervical adenopathy.  Neurological: He is oriented to person, place, and time.  Skin: Skin is warm and dry. No rash noted. He is not diaphoretic. No erythema. No pallor.  Vitals reviewed.   Lab Results  Component Value Date   WBC 10.6* 05/01/2015   HGB 16.9 05/01/2015   HCT 48.8 05/01/2015   PLT 254 05/01/2015   GLUCOSE 124* 05/01/2015   CHOL 120 02/16/2015   TRIG 41.0 02/16/2015   HDL 48.50 02/16/2015   LDLCALC 63 02/16/2015   ALT 46 05/01/2015   AST 36 05/01/2015   NA 138 05/01/2015   K 4.2 05/01/2015   CL 104 05/01/2015  CREATININE 1.02 05/01/2015   BUN 23* 05/01/2015   CO2 23 05/01/2015   TSH 1.27 02/16/2015    No results found.  Assessment & Plan:   Alexis FrockShaman was seen today for rectal pain.  Diagnoses and all orders for this visit:  Perianal cellulitis- he has a very subtle area of subcutaneous infection, there is nothing organized large enough for me to do incision and drainage, will treat empirically with a broad-spectrum antibiotic. -     amoxicillin-clavulanate (AUGMENTIN) 875-125 MG tablet; Take 1 tablet by mouth 2  (two) times daily.   I am having [name] start on amoxicillin-clavulanate. I am also having him maintain his cetirizine, psyllium, and dexlansoprazole.  Meds ordered this encounter  Medications  . amoxicillin-clavulanate (AUGMENTIN) 875-125 MG tablet    Sig: Take 1 tablet by mouth 2 (two) times daily.    Dispense:  20 tablet    Refill:  0     Follow-up: Return in about 2 weeks (around 04/27/2016).  Sanda Lingerhomas Damarko Stitely, MD

## 2016-08-24 ENCOUNTER — Encounter: Payer: Self-pay | Admitting: Adult Health

## 2016-08-24 ENCOUNTER — Ambulatory Visit (INDEPENDENT_AMBULATORY_CARE_PROVIDER_SITE_OTHER): Payer: 59 | Admitting: Adult Health

## 2016-08-24 ENCOUNTER — Telehealth: Payer: Self-pay | Admitting: Internal Medicine

## 2016-08-24 VITALS — BP 112/64 | Temp 98.1°F | Ht 69.0 in | Wt 153.2 lb

## 2016-08-24 DIAGNOSIS — J029 Acute pharyngitis, unspecified: Secondary | ICD-10-CM

## 2016-08-24 MED ORDER — METHYLPREDNISOLONE 4 MG PO TBPK: ORAL_TABLET | ORAL | 0 refills | Status: DC

## 2016-08-24 MED ORDER — MAGIC MOUTHWASH W/LIDOCAINE: 5.0000 mL | Freq: Three times a day (TID) | ORAL | 0 refills | Status: DC | PRN

## 2016-08-24 NOTE — Progress Notes (Signed)
Subjective:    Patient ID: Bernard [name], male    DOB: June 14, 1986, 30 y.o.   MRN: [id number]  HPI  30 year old male who presents to the office today with 7 days of sore throat. He reports no fevers, cough, congestion or SOB. He has difficulty with swallowing food and drink. Pain is more persistent at night.   He has been using OTC Mucinex and allergy medication which have had minimal effect.   He feels as though the sore throat is getting worse.    Review of Systems  Constitutional: Negative.   HENT: Positive for sore throat and trouble swallowing. Negative for congestion, drooling, ear pain, postnasal drip, rhinorrhea, sinus pressure and tinnitus.   Respiratory: Negative.   Cardiovascular: Negative.   Gastrointestinal: Negative.   Neurological: Negative.   Hematological: Positive for adenopathy.  All other systems reviewed and are negative.  Past Medical History:  Diagnosis Date  . GERD (gastroesophageal reflux disease)   . Sebaceous cyst     Social History   Social History  . Marital status: Single    Spouse name: N/A  . Number of children: 0  . 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 Smoking status: Former Smoker    Packs/day: 1.00    Years: 10.00    Types: E-cigarettes, Cigarettes    Quit date: 11/24/2012  . Smokeless tobacco: Never Used     Comment: quit e cigarette 2016  . Alcohol use 0.0 oz/week     Comment: occasional  . Drug use: No  . Sexual activity: Yes    Partners: Female   Other Topics Concern  . Not on file   Social History Narrative   [facility]   Lives with roommate/fiancee   Dr. Sonny DandyJeff Katz's stepson    Past Surgical History:  Procedure Laterality Date  . ESOPHAGOGASTRODUODENOSCOPY N/A 05/18/2015   Procedure: ESOPHAGOGASTRODUODENOSCOPY (EGD);  Surgeon: Louis Meckelobert D Kaplan, MD;  [facility];  Service: Endoscopy;  Laterality: N/A;    Family  History  Problem Relation Age of Onset  . Breast cancer Maternal Grandmother   . Heart disease Father     valve replacement  . Hypertension Father     Allergies  Allergen Reactions  . Mold Extract [Trichophyton]     Sneezing, runny nose.     Current Outpatient Prescriptions on File Prior to Visit  Medication Sig Dispense Refill  . cetirizine (ZYRTEC) 10 MG tablet Take 10 mg by mouth every morning.     Marland Kitchen. dexlansoprazole (DEXILANT) 60 MG capsule Take 1 capsule (60 mg total) by mouth daily before breakfast. 90 capsule 3  . psyllium (METAMUCIL SMOOTH TEXTURE) 28 % packet Take 1 packet by mouth every morning. Reported on 04/13/2016     No current facility-administered medications on file prior to visit.     BP 112/64   Temp 98.1 F (36.7 C) (Oral)   Ht 5\' 9"  (1.753 m)   Wt 153 lb 3.2 oz (69.5 kg)   BMI 22.62 kg/m       Objective:   Physical Exam  Constitutional: He is oriented to person, place, and time. He appears well-developed and well-nourished. No distress.  HENT:  Mouth/Throat: Uvula is midline and mucous membranes are normal. Posterior oropharyngeal erythema present. No oropharyngeal exudate, posterior oropharyngeal edema or tonsillar abscesses.  Cardiovascular: Normal rate, regular rhythm, normal heart sounds and intact distal  pulses.  Exam reveals no gallop and no friction rub.   No murmur heard. Pulmonary/Chest: Effort normal and breath sounds normal. No respiratory distress. He has no wheezes. He has no rales. He exhibits no tenderness.  Lymphadenopathy:       Head (left side): Submandibular and tonsillar adenopathy present.    He has cervical adenopathy.  Neurological: He is alert and oriented to person, place, and time.  Skin: Skin is warm and dry. No rash noted. He is not diaphoretic. No erythema. No pallor.  Psychiatric: He has a normal mood and affect. His behavior is normal. Judgment and thought content normal.  Nursing note and vitals reviewed.        Assessment & Plan:  1. Sore throat - Appears to be viral. No signs of strep Centor score = 2.  - magic mouthwash w/lidocaine SOLN; Take 5 mLs by mouth 3 (three) times daily as needed.  Dispense: 180 mL; Refill: 0 - methylPREDNISolone (MEDROL DOSEPAK) 4 MG TBPK tablet; Take as directed  Dispense: 21 tablet; Refill: 0 - Follow up in 2-3 days if no improvement - Tylenol or motrin for symptom relief.   Shirline Freesory Kynadee Dam, NP

## 2016-08-24 NOTE — Patient Instructions (Signed)
It was great meeting you today!  Your exam is consistent with a viral sore throat.    I have sent in a medrol dose pack to help with the pain and inflammation   Use the magic mouth wash as needed  Follow up if no improvement in the next 2-3 days

## 2016-08-24 NOTE — Telephone Encounter (Signed)
Patient Name: Bernard MangesSHAMAN Gomes  DOB: 04-10-1986    Initial Comment caller states he has a sore throat   Nurse Assessment  Nurse: Stefano GaulStringer, RN, Dwana CurdVera Date/Time (Eastern Time): 08/24/2016 8:16:40 AM  Confirm and document reason for call. If symptomatic, describe symptoms. You must click the next button to save text entered. ---Caller states he has sore throat. Having trouble talking due to his sore throat. No fever. Has pain on the left side of his throat. Has had sore throat for about 7-10 days mostly at night. No congestion or cough.  Has the patient traveled out of the country within the last 30 days? ---No  Does the patient have any new or worsening symptoms? ---Yes  Will a triage be completed? ---Yes  Related visit to physician within the last 2 weeks? ---No  Does the PT have any chronic conditions? (i.e. diabetes, asthma, etc.) ---No  Is this a behavioral health or substance abuse call? ---No     Guidelines    Guideline Title Affirmed Question Affirmed Notes  Sore Throat SEVERE (e.g., excruciating) throat pain    Final Disposition User   See Physician within 24 Hours Stringer, RN, Dwana CurdVera    Comments  [facility] or after and pt wants one earlier. [facility].  appt scheduled at 9:30 am with Shirline Freesory Nafziger [facility]   Referrals  REFERRED TO PCP OFFICE   Disagree/Comply: Comply

## 2016-10-28 DIAGNOSIS — R000 Tachycardia, unspecified: Secondary | ICD-10-CM | POA: Diagnosis not present

## 2016-10-28 DIAGNOSIS — R011 Cardiac murmur, unspecified: Secondary | ICD-10-CM | POA: Diagnosis not present

## 2016-10-28 DIAGNOSIS — I5189 Other ill-defined heart diseases: Secondary | ICD-10-CM | POA: Diagnosis not present

## 2016-11-18 ENCOUNTER — Other Ambulatory Visit: Payer: Self-pay | Admitting: Internal Medicine

## 2016-11-18 ENCOUNTER — Telehealth: Payer: Self-pay | Admitting: Internal Medicine

## 2016-11-18 MED ORDER — OSELTAMIVIR PHOSPHATE 75 MG PO CAPS: 75.0000 mg | ORAL_CAPSULE | Freq: Two times a day (BID) | ORAL | 0 refills | Status: AC

## 2016-11-18 NOTE — Telephone Encounter (Signed)
done

## 2016-11-18 NOTE — Telephone Encounter (Signed)
Pt thinks he is coming down with the flu, thinks he needs to be prescribed tamiflu, please adivse MS

## 2016-11-18 NOTE — Telephone Encounter (Signed)
Pt informed rx was sent.  

## 2016-12-02 ENCOUNTER — Telehealth: Payer: Self-pay | Admitting: Internal Medicine

## 2016-12-02 NOTE — Telephone Encounter (Signed)
Rq for [place] Immunization Registry.  Can you pull this one up when you have a chance?

## 2016-12-02 NOTE — Telephone Encounter (Signed)
Contacted pt and mailed copy of report to address on file.

## 2016-12-02 NOTE — Telephone Encounter (Signed)
Rpt printed...Bernard [name]/mb

## 2016-12-02 NOTE — Telephone Encounter (Signed)
The patient was wanting to know what immunizations he has on the [facility].

## 2017-02-28 ENCOUNTER — Encounter: Payer: Self-pay | Admitting: Internal Medicine

## 2017-02-28 ENCOUNTER — Ambulatory Visit (INDEPENDENT_AMBULATORY_CARE_PROVIDER_SITE_OTHER): Payer: 59 | Admitting: Internal Medicine

## 2017-02-28 ENCOUNTER — Other Ambulatory Visit (INDEPENDENT_AMBULATORY_CARE_PROVIDER_SITE_OTHER): Payer: 59

## 2017-02-28 VITALS — BP 134/84 | HR 70 | Temp 98.4°F | Resp 16 | Ht 69.0 in | Wt 159.8 lb

## 2017-02-28 DIAGNOSIS — Z0000 Encounter for general adult medical examination without abnormal findings: Secondary | ICD-10-CM | POA: Diagnosis not present

## 2017-02-28 DIAGNOSIS — K2100 Gastro-esophageal reflux disease with esophagitis, without bleeding: Secondary | ICD-10-CM

## 2017-02-28 DIAGNOSIS — F17290 Nicotine dependence, other tobacco product, uncomplicated: Secondary | ICD-10-CM

## 2017-02-28 DIAGNOSIS — R739 Hyperglycemia, unspecified: Secondary | ICD-10-CM | POA: Diagnosis not present

## 2017-02-28 DIAGNOSIS — F17200 Nicotine dependence, unspecified, uncomplicated: Secondary | ICD-10-CM | POA: Insufficient documentation

## 2017-02-28 LAB — COMPREHENSIVE METABOLIC PANEL
ALT: 41 U/L (ref 0–53)
AST: 26 U/L (ref 0–37)
Albumin: 4.3 g/dL (ref 3.5–5.2)
Alkaline Phosphatase: 40 U/L (ref 39–117)
BUN: 16 mg/dL (ref 6–23)
CHLORIDE: 105 meq/L (ref 96–112)
CO2: 31 mEq/L (ref 19–32)
Calcium: 9.4 mg/dL (ref 8.4–10.5)
Creatinine, Ser: 0.98 mg/dL (ref 0.40–1.50)
GFR: 94.37 mL/min (ref >60.00)
Glucose, Bld: 105 mg/dL — ABNORMAL HIGH (ref 70–99)
POTASSIUM: 4.4 meq/L (ref 3.5–5.1)
SODIUM: 140 meq/L (ref 135–145)
Total Bilirubin: 0.3 mg/dL (ref 0.2–1.2)
Total Protein: 6.5 g/dL (ref 6.0–8.3)

## 2017-02-28 LAB — CBC WITH DIFFERENTIAL/PLATELET
BASOS PCT: 1 % (ref 0.0–3.0)
Basophils Absolute: 0 10*3/uL (ref 0.0–0.1)
EOS PCT: 2.3 % (ref 0.0–5.0)
Eosinophils Absolute: 0.1 10*3/uL (ref 0.0–0.7)
HCT: 45.1 % (ref 39.0–52.0)
Hemoglobin: 15.3 g/dL (ref 13.0–17.0)
LYMPHS PCT: 32.1 % (ref 12.0–46.0)
Lymphs Abs: 1.5 10*3/uL (ref 0.7–4.0)
MCHC: 33.9 g/dL (ref 30.0–36.0)
MCV: 86.3 fl (ref 78.0–100.0)
Monocytes Absolute: 0.3 10*3/uL (ref 0.1–1.0)
Monocytes Relative: 7 % (ref 3.0–12.0)
NEUTROS PCT: 57.6 % (ref 43.0–77.0)
Neutro Abs: 2.7 10*3/uL (ref 1.4–7.7)
Platelets: 317 10*3/uL (ref 150.0–400.0)
RBC: 5.22 Mil/uL (ref 4.22–5.81)
RDW: 12.9 % (ref 11.5–15.5)
WBC: 4.6 10*3/uL (ref 4.0–10.5)

## 2017-02-28 LAB — LIPID PANEL
CHOL/HDL RATIO: 3
Cholesterol: 104 mg/dL (ref 0–200)
HDL: 36 mg/dL — ABNORMAL LOW (ref >39.00)
LDL CALC: 55 mg/dL (ref 0–99)
NonHDL: 68.09
Triglycerides: 65 mg/dL (ref 0.0–149.0)
VLDL: 13 mg/dL (ref 0.0–40.0)

## 2017-02-28 LAB — TSH: TSH: 1.04 u[IU]/mL (ref 0.35–4.50)

## 2017-02-28 LAB — HEMOGLOBIN A1C: Hgb A1c MFr Bld: 5.6 % (ref 4.6–6.5)

## 2017-02-28 MED ORDER — VARENICLINE TARTRATE 0.5 MG X 11 & 1 MG X 42 PO MISC: ORAL | 0 refills | Status: DC

## 2017-02-28 MED ORDER — DEXLANSOPRAZOLE 60 MG PO CPDR: 60.0000 mg | DELAYED_RELEASE_CAPSULE | Freq: Every day | ORAL | 3 refills | Status: DC

## 2017-02-28 MED ORDER — VARENICLINE TARTRATE 1 MG PO TABS: 1.0000 mg | ORAL_TABLET | Freq: Two times a day (BID) | ORAL | 3 refills | Status: DC

## 2017-02-28 NOTE — Progress Notes (Signed)
Pre visit review using our clinic review tool, if applicable. No additional management support is needed unless otherwise documented below in the visit note. 

## 2017-02-28 NOTE — Patient Instructions (Signed)
 Health Maintenance, Male A healthy lifestyle and preventive care is important for your health and wellness. Ask your health care provider about what schedule of regular examinations is right for you. What should I know about weight and diet?  Eat a Healthy Diet  Eat plenty of vegetables, fruits, whole grains, low-fat dairy products, and lean protein.  Do not eat a lot of foods high in solid fats, added sugars, or salt. Maintain a Healthy Weight  Regular exercise can help you achieve or maintain a healthy weight. You should:  Do at least 150 minutes of exercise each week. The exercise should increase your heart rate and make you sweat (moderate-intensity exercise).  Do strength-training exercises at least twice a week. Watch Your Levels of Cholesterol and Blood Lipids  Have your blood tested for lipids and cholesterol every 5 years starting at 32 years of age. If you are at high risk for heart disease, you should start having your blood tested when you are 32 years old. You may need to have your cholesterol levels checked more often if:  Your lipid or cholesterol levels are high.  You are older than 32 years of age.  You are at high risk for heart disease. What should I know about cancer screening? Many types of cancers can be detected early and may often be prevented. Lung Cancer  You should be screened every year for lung cancer if:  You are a current smoker who has smoked for at least 30 years.  You are a former smoker who has quit within the past 15 years.  Talk to your health care provider about your screening options, when you should start screening, and how often you should be screened. Colorectal Cancer  Routine colorectal cancer screening usually begins at 32 years of age and should be repeated every 5-10 years until you are 32 years old. You may need to be screened more often if early forms of precancerous polyps or small growths are found. Your health care provider  may recommend screening at an earlier age if you have risk factors for colon cancer.  Your health care provider may recommend using home test kits to check for hidden blood in the stool.  A small camera at the end of a tube can be used to examine your colon (sigmoidoscopy or colonoscopy). This checks for the earliest forms of colorectal cancer. Prostate and Testicular Cancer  Depending on your age and overall health, your health care provider may do certain tests to screen for prostate and testicular cancer.  Talk to your health care provider about any symptoms or concerns you have about testicular or prostate cancer. Skin Cancer  Check your skin from head to toe regularly.  Tell your health care provider about any new moles or changes in moles, especially if:  There is a change in a mole's size, shape, or color.  You have a mole that is larger than a pencil eraser.  Always use sunscreen. Apply sunscreen liberally and repeat throughout the day.  Protect yourself by wearing long sleeves, pants, a wide-brimmed hat, and sunglasses when outside. What should I know about heart disease, diabetes, and high blood pressure?  If you are 18-39 years of age, have your blood pressure checked every 3-5 years. If you are 40 years of age or older, have your blood pressure checked every year. You should have your blood pressure measured twice-once when you are at a hospital or clinic, and once when you are not at   a hospital or clinic. Record the average of the two measurements. To check your blood pressure when you are not at a hospital or clinic, you can use:  An automated blood pressure machine at a pharmacy.  A home blood pressure monitor.  Talk to your health care provider about your target blood pressure.  If you are between 45-79 years old, ask your health care provider if you should take aspirin to prevent heart disease.  Have regular diabetes screenings by checking your fasting blood sugar  level.  If you are at a normal weight and have a low risk for diabetes, have this test once every three years after the age of 45.  If you are overweight and have a high risk for diabetes, consider being tested at a younger age or more often.  A one-time screening for abdominal aortic aneurysm (AAA) by ultrasound is recommended for men aged 65-75 years who are current or former smokers. What should I know about preventing infection? Hepatitis B  If you have a higher risk for hepatitis B, you should be screened for this virus. Talk with your health care provider to find out if you are at risk for hepatitis B infection. Hepatitis C  Blood testing is recommended for:  Everyone born from 1945 through 1965.  Anyone with known risk factors for hepatitis C. Sexually Transmitted Diseases (STDs)  You should be screened each year for STDs including gonorrhea and chlamydia if:  You are sexually active and are younger than 32 years of age.  You are older than 32 years of age and your health care provider tells you that you are at risk for this type of infection.  Your sexual activity has changed since you were last screened and you are at an increased risk for chlamydia or gonorrhea. Ask your health care provider if you are at risk.  Talk with your health care provider about whether you are at high risk of being infected with HIV. Your health care provider may recommend a prescription medicine to help prevent HIV infection. What else can I do?  Schedule regular health, dental, and eye exams.  Stay current with your vaccines (immunizations).  Do not use any tobacco products, such as cigarettes, chewing tobacco, and e-cigarettes. If you need help quitting, ask your health care provider.  Limit alcohol intake to no more than 2 drinks per day. One drink equals 12 ounces of beer, 5 ounces of wine, or 1 ounces of hard liquor.  Do not use street drugs.  Do not share needles.  Ask your health  care provider for help if you need support or information about quitting drugs.  Tell your health care provider if you often feel depressed.  Tell your health care provider if you have ever been abused or do not feel safe at home. This information is not intended to replace advice given to you by your health care provider. Make sure you discuss any questions you have with your health care provider. Document Released: 04/07/2008 Document Revised: 06/08/2016 Document Reviewed: 07/14/2015 Elsevier Interactive Patient Education  2017 Elsevier Inc.  

## 2017-02-28 NOTE — Progress Notes (Signed)
Subjective:  Patient ID: Bernard [name], male    DOB: 1986/01/09  Age: 31 y.o. MRN: [id number]  CC: Annual Exam    HPI Bernard [name] presents for a CPX-  He inhales nicotine through vaping and wants to quit. He is interested in trying Chantix. He also takes a PPI chronically and tells me his heartburn is well controlled. He's had no recent episodes of odynophagia, dysphagia, weight loss, or laryngitis.  Outpatient Medications Prior to Visit  Medication Sig Dispense Refill  . cetirizine (ZYRTEC) 10 MG tablet Take 10 mg by mouth every morning.     . psyllium (METAMUCIL SMOOTH TEXTURE) 28 % packet Take 1 packet by mouth every morning. Reported on 04/13/2016    . dexlansoprazole (DEXILANT) 60 MG capsule Take 1 capsule (60 mg total) by mouth daily before breakfast. 90 capsule 3  . magic mouthwash w/lidocaine SOLN Take 5 mLs by mouth 3 (three) times daily as needed. 180 mL 0   No facility-administered medications prior to visit.     ROS Review of Systems  Constitutional: Negative.  Negative for activity change, appetite change, diaphoresis, fatigue and unexpected weight change.  HENT: Negative for sore throat, trouble swallowing and voice change.   Eyes: Negative.   Respiratory: Negative for cough, chest tightness, shortness of breath and wheezing.   Cardiovascular: Negative for chest pain and leg swelling.  Gastrointestinal: Negative.  Negative for abdominal pain, constipation, diarrhea, nausea and vomiting.  Endocrine: Negative.   Genitourinary: Negative.  Negative for difficulty urinating, dysuria, hematuria, penile swelling, scrotal swelling, testicular pain and urgency.  Musculoskeletal: Negative.  Negative for back pain and neck pain.  Skin: Negative.  Negative for color change and rash.  Allergic/Immunologic: Negative.   Neurological: Negative.   Hematological: Negative for adenopathy. Does not bruise/bleed easily.  Psychiatric/Behavioral: Negative.     Objective:  BP  134/84 (BP Location: Left Arm, Patient Position: Sitting, Cuff Size: Normal)   Pulse 70   Temp 98.4 F (36.9 C) (Oral)   Resp 16   Ht 5\' 9"  (1.753 m)   Wt 159 lb 12 oz (72.5 kg)   SpO2 100%   BMI 23.59 kg/m   BP Readings from Last 3 Encounters:  02/28/17 134/84  08/24/16 112/64  04/13/16 118/78    Wt Readings from Last 3 Encounters:  02/28/17 159 lb 12 oz (72.5 kg)  08/24/16 153 lb 3.2 oz (69.5 kg)  04/13/16 162 lb (73.5 kg)    Physical Exam  Constitutional: No distress.  HENT:  Mouth/Throat: Oropharynx is clear and moist. No oropharyngeal exudate.  Eyes: Conjunctivae are normal. Right eye exhibits no discharge. Left eye exhibits no discharge. No scleral icterus.  Neck: Normal range of motion. Neck supple. No JVD present. No tracheal deviation present. No thyromegaly present.  Cardiovascular: Normal rate, regular rhythm, normal heart sounds and intact distal pulses.  Exam reveals no gallop and no friction rub.   No murmur heard. Pulmonary/Chest: Effort normal and breath sounds normal. No stridor. No respiratory distress. He has no wheezes. He has no rales. He exhibits no tenderness.  Abdominal: Soft. Bowel sounds are normal. He exhibits no distension and no mass. There is no tenderness. There is no rebound and no guarding. Hernia confirmed negative in the right inguinal area and confirmed negative in the left inguinal area.  Genitourinary: Testes normal and penis normal. Right testis shows no mass, no swelling and no tenderness. Right testis is descended. Left testis shows no mass, no swelling and no  tenderness. Left testis is descended. Circumcised. No penile erythema or penile tenderness. No discharge found.  Lymphadenopathy:    He has no cervical adenopathy.       Right: No inguinal adenopathy present.       Left: No inguinal adenopathy present.  Skin: He is not diaphoretic.  Vitals reviewed.   Lab Results  Component Value Date   WBC 4.6 02/28/2017   HGB 15.3  02/28/2017   HCT 45.1 02/28/2017   PLT 317.0 02/28/2017   GLUCOSE 105 (H) 02/28/2017   CHOL 104 02/28/2017   TRIG 65.0 02/28/2017   HDL 36.00 (L) 02/28/2017   LDLCALC 55 02/28/2017   ALT 41 02/28/2017   AST 26 02/28/2017   NA 140 02/28/2017   K 4.4 02/28/2017   CL 105 02/28/2017   CREATININE 0.98 02/28/2017   BUN 16 02/28/2017   CO2 31 02/28/2017   TSH 1.04 02/28/2017   HGBA1C 5.6 02/28/2017    No results found.  Assessment & Plan:   Bernard [name] was seen today for annual exam.  Diagnoses and all orders for this visit:  Routine general medical examination at a health care facility- Exam completed, labs ordered and reviewed, vaccines reviewed and updated, patient education material was given. -     Lipid panel; Future -     Comprehensive metabolic panel; Future -     CBC with Differential/Platelet; Future -     TSH; Future  Hyperglycemia- he is very mildly prediabetic, he agrees to work on his lifestyle modifications. -     Hemoglobin A1c; Future  Gastroesophageal reflux disease with esophagitis- his symptoms are well controlled, will continue the PPI. -     dexlansoprazole (DEXILANT) 60 MG capsule; Take 1 capsule (60 mg total) by mouth daily before breakfast.  Other tobacco product nicotine dependence, uncomplicated -     varenicline (CHANTIX CONTINUING MONTH PAK) 1 MG tablet; Take 1 tablet (1 mg total) by mouth 2 (two) times daily. -     varenicline (CHANTIX STARTING MONTH PAK) 0.5 MG X 11 & 1 MG X 42 tablet; Take one 0.5 mg tablet by mouth once daily for 3 days, then increase to one 0.5 mg tablet twice daily for 4 days, then increase to one 1 mg tablet twice daily.   I have discontinued Mr. Olveda magic mouthwash w/lidocaine. I am also having him start on varenicline and varenicline. Additionally, I am having him maintain his cetirizine, psyllium, and dexlansoprazole.  Meds ordered this encounter  Medications  . dexlansoprazole (DEXILANT) 60 MG capsule    Sig: Take 1  capsule (60 mg total) by mouth daily before breakfast.    Dispense:  90 capsule    Refill:  3  . varenicline (CHANTIX CONTINUING MONTH PAK) 1 MG tablet    Sig: Take 1 tablet (1 mg total) by mouth 2 (two) times daily.    Dispense:  60 tablet    Refill:  3  . varenicline (CHANTIX STARTING MONTH PAK) 0.5 MG X 11 & 1 MG X 42 tablet    Sig: Take one 0.5 mg tablet by mouth once daily for 3 days, then increase to one 0.5 mg tablet twice daily for 4 days, then increase to one 1 mg tablet twice daily.    Dispense:  53 tablet    Refill:  0     Follow-up: Return in about 4 months (around 07/01/2017).  Sanda Linger, MD

## 2017-06-09 ENCOUNTER — Encounter: Payer: Self-pay | Admitting: Family Medicine

## 2017-06-09 ENCOUNTER — Ambulatory Visit: Payer: Self-pay

## 2017-06-09 ENCOUNTER — Ambulatory Visit (INDEPENDENT_AMBULATORY_CARE_PROVIDER_SITE_OTHER): Payer: 59 | Admitting: Family Medicine

## 2017-06-09 VITALS — BP 122/82 | HR 62 | Ht 69.0 in

## 2017-06-09 DIAGNOSIS — M222X2 Patellofemoral disorders, left knee: Secondary | ICD-10-CM

## 2017-06-09 DIAGNOSIS — M25561 Pain in right knee: Secondary | ICD-10-CM

## 2017-06-09 DIAGNOSIS — G8929 Other chronic pain: Secondary | ICD-10-CM | POA: Diagnosis not present

## 2017-06-09 DIAGNOSIS — M222X1 Patellofemoral disorders, right knee: Secondary | ICD-10-CM | POA: Insufficient documentation

## 2017-06-09 DIAGNOSIS — M357 Hypermobility syndrome: Secondary | ICD-10-CM

## 2017-06-09 MED ORDER — DICLOFENAC SODIUM 2 % TD SOLN: 2.0000 "application " | Freq: Two times a day (BID) | TRANSDERMAL | 3 refills | Status: DC

## 2017-06-09 MED ORDER — VITAMIN D (ERGOCALCIFEROL) 1.25 MG (50000 UNIT) PO CAPS: 50000.0000 [IU] | ORAL_CAPSULE | ORAL | 0 refills | Status: DC

## 2017-06-09 NOTE — Patient Instructions (Addendum)
Good to see you  Ice 20 minutes 2 times daily. Usually after activity and before bed. Exercises 3 times a week.  pennsaid pinkie amount topically 2 times daily as needed.  Once weekly vitamin D OK to lift but drop weight 50% and use knee compression sleeves ([email or web address]) Start a walk-run progression:. ONLY 2 times a week.  - Initially start one minute walking than one minute running for 20 mins in the first week,   then 25 mins during the second week, then 30 mins afterwards.  Once you have reached 30 mins: - Run 2 mins, then walk 1 min. -Then run 3 mins, and walk 1 min. -Then run 4 mins, and walk 1 min. -Then run 5 mins, and walk 1 min. -Slowly build up weekly to running 30 mins nonstop.  If painful at any of the steps, back up one step  See me again in 4 weeks

## 2017-06-09 NOTE — Progress Notes (Signed)
[name] [address] [place], [place] [address] Phone: [phone] Subjective:    I'm seeing this patient by the request  of:    CC:   [id number]  Bernard [name] is a 31 y.o. male coming in with complaint of left knee pain. HE has been running and increasing .5 each week and is running around 5-6 miles. He has the pain around mile 3 but is able to continue to run. His pain is primarily there when he is standing but diminishes with sedentary. He has started to do some lifting and deadlifting can sometimes cause a similar pain. He works as an Museum/gallery exhibitions officer and also feels that his knee pain is coming from lifting.   Onset- 3 weeks ago Location- left knee but does happen to the right  Duration- intermittent Character-achy  Aggravating factors- Reliving factors-  Therapies tried- rest, stretching, strengthening Severity-0/10     Past Medical History:  Diagnosis Date  . GERD (gastroesophageal reflux disease)   . Sebaceous cyst    Past Surgical History:  Procedure Laterality Date  . ESOPHAGOGASTRODUODENOSCOPY N/A 05/18/2015   Procedure: ESOPHAGOGASTRODUODENOSCOPY (EGD);  Surgeon: Louis Meckel, MD;  Location: [facility];  Service: Endoscopy;  Laterality: N/A;   Social History   Social History  . Marital status: Single    Spouse name: N/A  . Number of children: 0  . 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 Smoking status: Former Smoker    Packs/day: 1.00    Years: 10.00    Types: E-cigarettes, Cigarettes    Quit date: 11/24/2012  . Smokeless tobacco: Never Used     Comment: quit e cigarette 2016  . Alcohol use 0.0 oz/week     Comment: occasional  . Drug use: No  . Sexual activity: Yes    Partners: Female   Other Topics Concern  . 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 Tora Perches stepson    Allergies  Allergen Reactions  . Mold Extract [Trichophyton]     Sneezing, runny nose.    Family History  Problem Relation Age of Onset  . Breast cancer Maternal Grandmother   . Heart disease Father        valve replacement  . Hypertension Father      Past medical history, social, surgical and family history all reviewed in electronic medical record.  No pertanent information unless stated regarding to the chief complaint.   Review of Systems:Review of systems updated and as accurate as of 06/09/17  No headache, visual changes, nausea, vomiting, diarrhea, constipation, dizziness, abdominal pain, skin rash, fevers, chills, night sweats, weight loss, swollen lymph nodes, body aches, joint swelling, muscle aches, chest pain, shortness of breath, mood changes.   Objective  There were no vitals taken for this visit. Systems examined below as of 06/09/17   General: No apparent distress alert and oriented x3 mood and affect normal, dressed appropriately.  HEENT: Pupils equal, extraocular movements intact  Respiratory: Patient's speak in full sentences and does not appear short of breath  Cardiovascular: No lower extremity edema, non tender, no erythema  Skin: Warm dry intact with no signs of infection or rash on extremities or on axial skeleton.  Abdomen: Soft nontender  Neuro: Cranial nerves II through XII are intact, neurovascularly intact in all extremities  with 2+ DTRs and 2+ pulses.  Lymph: No lymphadenopathy of posterior or anterior cervical chain or axillae bilaterally.  Gait normal with good balance and coordination.  MSK:  Non tender with full range of motion and good stability and symmetric strength and tone of shoulders, elbows, wrist, hip, knee and ankles bilaterally.     Impression and Recommendations:     This case required medical decision making of moderate complexity.      Note: This dictation was prepared with Dragon dictation along with smaller phrase  technology. Any transcriptional errors that result from this process are unintentional.

## 2017-06-09 NOTE — Progress Notes (Signed)
[name] [address] [place], [place] [address] Phone: [phone] Subjective:    I'm seeing this patient by the request  of:  Etta Grandchild, MD   CC: Bilateral knee pain  [id number]  Bernard [name] is a 31 y.o. male coming in with complaint of bilateral knee pain. Patient is had this for quite some time but seems to be worsening. Notices it worse with deep squats, maybe some swelling. Patient denies numbness. Patient does not show evidence radiating down. This is not affecting daily activities. Patient went to be running on a more regular basis but is unable to do so secondary to the discomfort and pain. Rates the severity of pain as 5 out of 10      Past Medical History:  Diagnosis Date  . GERD (gastroesophageal reflux disease)   . Sebaceous cyst    Past Surgical History:  Procedure Laterality Date  . ESOPHAGOGASTRODUODENOSCOPY N/A 05/18/2015   Procedure: ESOPHAGOGASTRODUODENOSCOPY (EGD);  Surgeon: Louis Meckel, MD;  Location: [facility];  Service: Endoscopy;  Laterality: N/A;   Social History   Social History  . Marital status: Single    Spouse name: N/A  . Number of children: 0  . 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 Smoking status: Former Smoker    Packs/day: 1.00    Years: 10.00    Types: E-cigarettes, Cigarettes    Quit date: 11/24/2012  . Smokeless tobacco: Never Used     Comment: quit e cigarette 2016  . Alcohol use 0.0 oz/week     Comment: occasional  . Drug use: No  . Sexual activity: Yes    Partners: Female   Other Topics Concern  . 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 Tora Perches stepson   Allergies  Allergen Reactions  . Mold Extract [Trichophyton]     Sneezing, runny nose.    Family History  Problem Relation Age of Onset  . Breast cancer Maternal Grandmother     . Heart disease Father        valve replacement  . Hypertension Father      Past medical history, social, surgical and family history all reviewed in electronic medical record.  No pertanent information unless stated regarding to the chief complaint.   Review of Systems:Review of systems updated and as accurate as of 06/09/17  No headache, visual changes, nausea, vomiting, diarrhea, constipation, dizziness, abdominal pain, skin rash, fevers, chills, night sweats, weight loss, swollen lymph nodes, body aches, joint swelling, muscle aches, chest pain, shortness of breath, mood changes.   Objective  Blood pressure 122/82, pulse 62, height 5\' 9"  (1.753 m). Systems examined below as of 06/09/17   General: No apparent distress alert and oriented x3 mood and affect normal, dressed appropriately.  HEENT: Pupils equal, extraocular movements intact  Respiratory: Patient's speak in full sentences and does not appear short of breath  Cardiovascular: No lower extremity edema, non tender, no erythema  Skin: Warm dry intact with no signs of infection or rash on extremities or on axial skeleton.  Abdomen: Soft nontender  Neuro: Cranial nerves II through XII are intact, neurovascularly intact in all extremities with 2+ DTRs and 2+ pulses.  Lymph: No lymphadenopathy of posterior or anterior cervical chain or axillae bilaterally.  Gait normal with good balance and  coordination.  MSK:  Non tender with full range of motion and good stability and symmetric strength and tone of shoulders, elbows, wrist, hip, and ankles bilaterally. Hypermobility syndrome noted Knee:Bilateral Mild lateral tilt noted Palpation normal with no warmth, joint line tenderness, patellar tenderness, or condyle tenderness. ROM full in flexion and extension and lower leg rotation. Ligaments with solid consistent endpoints including ACL, PCL, LCL, MCL. Negative Mcmurray's, Apley's, and Thessalonian tests. Non painful patellar  compression. Patellar glide without crepitus. Patellar and quadriceps tendons unremarkable. Hamstring and quadriceps strength is normal.  With deep squats patient does have some mild subluxation of the right knee   MSK US performed of: Bilateral knee This study was ordered, performed, and interpreted by Terrilee Files D.O.  Knee: All structures visualized. Anteromedial, anterolateral, posteromedial, and posterolateral menisci unremarkable without tearing, fraying, effusion, or displacement. Patellar Tendon unremarkable on long and transverse views without effusion. No abnormality of prepatellar bursa. LCL and MCL unremarkable on long and transverse views. No abnormality of origin of medial or lateral head of the gastrocnemius.  IMPRESSION:  NORMAL ULTRASONOGRAPHIC EXAMINATION OF THE KNEE.     [id number]; 15 additional minutes spent for Therapeutic exercises as stated in above notes.  This included exercises focusing on stretching, strengthening, with significant focus on eccentric aspects.   Long term goals include an improvement in range of motion, strength, endurance as well as avoiding reinjury. Patient's frequency would include in 1-2 times a day, 3-5 times a week for a duration of 6-12 weeks.  Patellofemoral Syndrome  Reviewed anatomy using anatomical model and how PFS occurs.  Given rehab exercises handout for VMO, hip abductors, core, entire kinetic chain including proprioception exercises including cone touches, step downs, hip elevations and turn outs.  Could benefit from PT, regular exercise, upright biking, and a PFS knee brace to assist with tracking abnormalities. theraband givenProper technique shown and discussed handout in great detail with ATC.  All questions were discussed and answered.   Impression and Recommendations:     This case required medical decision making of moderate complexity.      Note: This dictation was prepared with Dragon dictation along with smaller  phrase technology. Any transcriptional errors that result from this process are unintentional.

## 2017-06-09 NOTE — Assessment & Plan Note (Signed)
Very mild overall. I do think more secondary to patient's hypermobility syndrome. We discussed compression sleeve, once weekly vitamin D, topical anti-inflammatories. Patient will do home exercises and work with Event organiser. We discussed icing regimen. If no improvement possible formal physical therapy will be needed. Patient follow-up with me again in 4 weeks.

## 2017-06-09 NOTE — Assessment & Plan Note (Signed)
Patient does have hypermobility syndrome which is likely causing some of the discomfort and pain. We discussed icing regimen and home exercises. We discussed objective is to do a which ones to avoid.

## 2017-06-17 ENCOUNTER — Encounter (HOSPITAL_COMMUNITY): Payer: Self-pay

## 2017-06-17 ENCOUNTER — Emergency Department (HOSPITAL_COMMUNITY)
Admission: EM | Admit: 2017-06-17 | Discharge: 2017-06-17 | Disposition: A | Discharge status: Home / Self Care | Payer: Worker's Compensation | Attending: Emergency Medicine | Admitting: Emergency Medicine

## 2017-06-17 DIAGNOSIS — Z79899 Other long term (current) drug therapy: Secondary | ICD-10-CM | POA: Diagnosis not present

## 2017-06-17 DIAGNOSIS — Z87891 Personal history of nicotine dependence: Secondary | ICD-10-CM | POA: Diagnosis not present

## 2017-06-17 DIAGNOSIS — Z7721 Contact with and (suspected) exposure to potentially hazardous body fluids: Secondary | ICD-10-CM

## 2017-06-17 LAB — RAPID HIV SCREEN (HIV 1/2 AB+AG)
HIV 1/2 ANTIBODIES: NONREACTIVE
HIV-1 P24 Antigen - HIV24: NONREACTIVE

## 2017-06-17 NOTE — ED Notes (Signed)
Pt declined DC vitals 

## 2017-06-17 NOTE — ED Triage Notes (Signed)
Pt was bringing in the patient that is currently in TCU room 28. That patient spit in pt R eye. Possible blood mixed with the saliva. Pt denies pain. A&Ox.4

## 2017-06-18 NOTE — ED Provider Notes (Signed)
[facility]-EMERGENCY DEPT Provider Note   CSN: [id number] Arrival date & time: 06/17/17  1936     History   Chief Complaint Chief Complaint  Patient presents with  . Body Fluid Exposure    HPI Bernard [name] is a 31 y.o. male.  HPI Patient presents after exposure to bodily fluids. It was raining antipsychotic patient stated his eye. She also had a nosebleed. Went in right eye. He is EMS. Otherwise without complaints. Here for baseline and source testing. Past Medical History:  Diagnosis Date  . GERD (gastroesophageal reflux disease)   . Sebaceous cyst     Patient Active Problem List   Diagnosis Date Noted  . Patellofemoral syndrome of both knees 06/09/2017  . Benign hypermobility syndrome 06/09/2017  . Hyperglycemia 02/28/2017  . Nicotine dependence 02/28/2017  . Perianal cellulitis 04/13/2016  . Hemorrhoids 12/23/2015  . Routine general medical examination at a health care facility 02/16/2015  . GASTROESOPHAGEAL REFLUX DISEASE 12/29/2007    Past Surgical History:  Procedure Laterality Date  . ESOPHAGOGASTRODUODENOSCOPY N/A 05/18/2015   Procedure: ESOPHAGOGASTRODUODENOSCOPY (EGD);  Surgeon: Louis Meckel, MD;  Location: [facility];  Service: Endoscopy;  Laterality: N/A;       Home Medications    Prior to Admission medications   Medication Sig Start Date End Date Taking? Authorizing Provider  cetirizine (ZYRTEC) 10 MG tablet Take 10 mg by mouth every morning.    Yes [provider]  dexlansoprazole (DEXILANT) 60 MG capsule Take 1 capsule (60 mg total) by mouth daily before breakfast. 02/28/17  Yes Etta Grandchild, MD  Diclofenac Sodium (PENNSAID) 2 % SOLN Place 2 application onto the skin 2 (two) times daily. 06/09/17  Yes Judi Saa, DO  Multiple Vitamin (MULTIVITAMIN WITH MINERALS) TABS tablet Take 1 tablet by mouth daily.   Yes [provider]  psyllium (METAMUCIL SMOOTH TEXTURE) 28 % packet Take 1 packet by mouth every morning. Reported on  04/13/2016   Yes [provider]  Vitamin D, Ergocalciferol, (DRISDOL) 50000 units CAPS capsule Take 1 capsule (50,000 Units total) by mouth every 7 (seven) days. 06/09/17  Yes Judi Saa, DO  varenicline (CHANTIX CONTINUING MONTH PAK) 1 MG tablet Take 1 tablet (1 mg total) by mouth 2 (two) times daily. Patient not taking: Reported on 06/17/2017 02/28/17   Etta Grandchild, MD  varenicline (CHANTIX STARTING MONTH PAK) 0.5 MG X 11 & 1 MG X 42 tablet Take one 0.5 mg tablet by mouth once daily for 3 days, then increase to one 0.5 mg tablet twice daily for 4 days, then increase to one 1 mg tablet twice daily. Patient not taking: Reported on 06/17/2017 02/28/17   Etta Grandchild, MD    Family History Family History  Problem Relation Age of Onset  . Breast cancer Maternal Grandmother   . Heart disease Father        valve replacement  . Hypertension Father     Social History Social History  Substance Use Topics  . Smoking status: Former Smoker    Packs/day: 1.00    Years: 10.00    Types: E-cigarettes, Cigarettes    Quit date: 11/24/2012  . Smokeless tobacco: Never Used     Comment: quit e cigarette 2016  . Alcohol use 0.0 oz/week     Comment: occasional     Allergies   Mold extract [trichophyton]   Review of Systems Review of Systems  Constitutional: Negative for appetite change.  Eyes: Negative for photophobia, pain and  redness.  Skin: Negative for rash.     Physical Exam Updated Vital Signs BP (!) 148/92 (BP Location: Right Arm)   Pulse 71   Temp 98.2 F (36.8 C) (Oral)   Resp 16   SpO2 99%   Physical Exam  Constitutional: He appears well-developed.  HENT:  Head: Atraumatic.  Eyes: Pupils are equal, round, and reactive to light. Conjunctivae and EOM are normal.  Neurological: He is alert.  Skin: Skin is warm.     ED Treatments / Results  Labs (all labs ordered are listed, but only abnormal results are displayed) Labs Reviewed  RAPID HIV SCREEN (HIV  1/2 AB+AG)  HEPATITIS PANEL, ACUTE    EKG  EKG Interpretation None       Radiology No results found.  Procedures Procedures (including critical care time)  Medications Ordered in ED Medications - No data to display   Initial Impression / Assessment and Plan / ED Course  I have reviewed the triage vital signs and the nursing notes.  Pertinent labs & imaging results that were available during my care of the patient were reviewed by me and considered in my medical decision making (see chart for details).     Patient with body fluid exposure. Patient labs and source testing have been done. Will follow-up as an outpatient  Final Clinical Impressions(s) / ED Diagnoses   Final diagnoses:  Exposure to potentially hazardous body fluids    New Prescriptions Discharge Medication List as of 06/17/2017  8:51 PM       Benjiman Core, MD 06/18/17 [phone]

## 2017-06-20 LAB — HEPATITIS PANEL, ACUTE
HCV Ab: <0.1 s/co ratio (ref 0.0–0.9)
HEP A IGM: NEGATIVE
HEP B C IGM: NEGATIVE
Hepatitis B Surface Ag: NEGATIVE

## 2017-06-30 ENCOUNTER — Encounter: Payer: Self-pay | Admitting: Family Medicine

## 2017-06-30 ENCOUNTER — Ambulatory Visit (INDEPENDENT_AMBULATORY_CARE_PROVIDER_SITE_OTHER): Payer: 59 | Admitting: Family Medicine

## 2017-06-30 DIAGNOSIS — M222X2 Patellofemoral disorders, left knee: Secondary | ICD-10-CM

## 2017-06-30 DIAGNOSIS — M222X1 Patellofemoral disorders, right knee: Secondary | ICD-10-CM | POA: Diagnosis not present

## 2017-06-30 NOTE — Patient Instructions (Addendum)
Good to see you  Bernard [name] is your friend.  If needed then consider Ibuprofen 3 pills 3 times a day for 3 days.  Stay active Continue the exercises Keep trucking on See me again in 6-8 weeks.

## 2017-06-30 NOTE — Progress Notes (Signed)
Bernard ScaleZach Mylan [name] D.O. [facility] [address] [place], [place] [address] Phone: [phone] Subjective:    I'm seeing this patient by the request  of:    CC:  Left knee pain  XLK:[id number]:Subjective  Bernard HughsShaman R [name] is a 31 y.o. male coming in for follow up for his left knee pain. He complains of a soreness but less pain. He is running less and has been doing the stretches. He is at 30 minutes 1 minute walking 1 minute running, 2 times a week. Patient is actually having a little more pain on the right side of the knee. Feels very similar. Patient denies any numbness, denies any radiation of pain. Feels that he is making progress but is concerned that it is only because he has taken a step back. Continues to work on a regular basis without any significant difficulty.     Past Medical History:  Diagnosis Date  . GERD (gastroesophageal reflux disease)   . Sebaceous cyst    Past Surgical History:  Procedure Laterality Date  . ESOPHAGOGASTRODUODENOSCOPY N/A 05/18/2015   Procedure: ESOPHAGOGASTRODUODENOSCOPY (EGD);  Surgeon: Louis Meckelobert D Kaplan, MD;  [facility];  Service: Endoscopy;  Laterality: N/A;   Social History   Social History  . Marital status: Single    Spouse name: N/A  . Number of children: 0  . 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 Smoking status: Former Smoker    Packs/day: 1.00    Years: 10.00    Types: E-cigarettes, Cigarettes    Quit date: 11/24/2012  . Smokeless tobacco: Never Used     Comment: quit e cigarette 2016  . Alcohol use 0.0 oz/week     Comment: occasional  . Drug use: No  . Sexual activity: Yes    Partners: Female   Other Topics Concern  . None   Social History Narrative   [facility]   Lives with roommate/fiancee   Dr. Tora PerchesJeff Katz's stepson   Allergies  Allergen Reactions  . Mold Extract [Trichophyton]     Sneezing, runny nose.    Family  History  Problem Relation Age of Onset  . Breast cancer Maternal Grandmother   . Heart disease Father        valve replacement  . Hypertension Father      Past medical history, social, surgical and family history all reviewed in electronic medical record.  No pertanent information unless stated regarding to the chief complaint.   Review of Systems: No headache, visual changes, nausea, vomiting, diarrhea, constipation, dizziness, abdominal pain, skin rash, fevers, chills, night sweats, weight loss, swollen lymph nodes, body aches, joint swelling, muscle aches, chest pain, shortness of breath, mood changes.    Objective  Blood pressure 110/82, pulse 76, height 5\' 9"  (1.753 m), weight 154 lb (69.9 kg), SpO2 95 %. Systems examined below as of 06/30/17   General: No apparent distress alert and oriented x3 mood and affect normal, dressed appropriately.  HEENT: Pupils equal, extraocular movements intact  Respiratory: Patient's speak in full sentences and does not appear short of breath  Cardiovascular: No lower extremity edema, non tender, no erythema  Skin: Warm dry intact with no signs of infection or rash on extremities or on axial skeleton.  Abdomen: Soft nontender  Neuro: Cranial nerves II through XII are intact, neurovascularly intact in all extremities with 2+ DTRs and 2+ pulses.  Lymph: No  lymphadenopathy of posterior or anterior cervical chain or axillae bilaterally.  Gait normal with good balance and coordination.  MSK:  Non tender with full range of motion and good stability and symmetric strength and tone of shoulders, elbows, wrist, hip, and ankles bilaterally.  Knee: Bilateral Normal to inspection with no erythema or effusion or obvious bony abnormalities. Palpation normal with no warmth, joint line tenderness, patellar tenderness, or condyle tenderness. ROM full in flexion and extension and lower leg rotation. Ligaments with solid consistent endpoints including ACL, PCL, LCL,  MCL. Negative Mcmurray's, Apley's, and Thessalonian tests. Mild painful patellar compression. Patellar glide without crepitus. Patellar and quadriceps tendons unremarkable. Hamstring and quadriceps strength is normal.     Impression and Recommendations:     This case required medical decision making of moderate complexity.      Note: This dictation was prepared with Dragon dictation along with smaller phrase technology. Any transcriptional errors that result from this process are unintentional.

## 2017-06-30 NOTE — Assessment & Plan Note (Signed)
Patient is making improvement. Discussed icing regimen, given a new brace today that I think be beneficial. Encourage continue the exercises and given more strengthening component. Patient will follow-up again in 6-8 weeks.

## 2017-08-03 ENCOUNTER — Ambulatory Visit (INDEPENDENT_AMBULATORY_CARE_PROVIDER_SITE_OTHER): Payer: 59 | Admitting: Physician Assistant

## 2017-08-03 ENCOUNTER — Encounter: Payer: Self-pay | Admitting: Physician Assistant

## 2017-08-03 ENCOUNTER — Other Ambulatory Visit: Payer: Self-pay | Admitting: Physician Assistant

## 2017-08-03 VITALS — BP 120/60 | HR 76 | Ht 69.0 in | Wt 166.0 lb

## 2017-08-03 DIAGNOSIS — R1013 Epigastric pain: Secondary | ICD-10-CM | POA: Diagnosis not present

## 2017-08-03 DIAGNOSIS — K219 Gastro-esophageal reflux disease without esophagitis: Secondary | ICD-10-CM | POA: Diagnosis not present

## 2017-08-03 MED ORDER — DEXLANSOPRAZOLE 60 MG PO CPDR: 60.0000 mg | DELAYED_RELEASE_CAPSULE | Freq: Every day | ORAL | 3 refills | Status: DC

## 2017-08-03 MED ORDER — SUCRALFATE 1 G PO TABS: 1.0000 g | ORAL_TABLET | Freq: Four times a day (QID) | ORAL | 3 refills | Status: DC

## 2017-08-03 MED ORDER — RANITIDINE HCL 150 MG PO TABS
150.0000 mg | ORAL_TABLET | Freq: Two times a day (BID) | ORAL | 1 refills | Status: DC
Start: 2017-08-03 — End: 2017-08-03

## 2017-08-03 NOTE — Progress Notes (Addendum)
Chief Complaint: GERD, epigastric pain  HPI:  Bernard [name] is a 31 year old Caucasian male with a past medical history of chronic reflux disease, who follows with Dr. Leone Payor and presents today with a complaint of increasing reflux and epigastric pain.   Patient's last EGD was performed in 2016 by Dr. Arlyce Dice and was normal. At time of last visit to our clinic 12/23/15 patient's symptoms were controlled on Dexilant 60 mg once daily.   Today, the patient explains his long history of reflux symptoms and being tried on various antiacids in the past. He had been doing well on Dexilant 60 mg once daily with occasional Tums as needed when he "ate or drank an offending agent", but as of Sunday night his symptoms have no longer been controlled. He describes that he has a "deeper pain" which is described as a burning in his epigastrium and a "heat", this is worse about 10 min after he eats any food. Patient has been using Mylanta which seems to take the edge off but does not completely help. Patient also stopped his coffee drinking and switched his fiber from a Metamucil citric substance to tablets as he thought this may be worsening his symptoms. For the past 2 nights patient has used 60 mg of Dexilant 2 tabs at night before sleeping. This has not helped his symptoms. Patient denies any recent change of medications, change in diet or use of NSAIDs. He tells me that in general this pain feels "different than before".   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   Patient denies fever, chills, blood in his stool, melena, change in bowel habits, weight loss, anorexia, nausea, vomiting or dysphagia.  Past Medical History:  Diagnosis Date  . GERD (gastroesophageal reflux disease)   . Hemorrhoid   . Sebaceous cyst     Past Surgical History:  Procedure Laterality Date  . ESOPHAGOGASTRODUODENOSCOPY N/A 05/18/2015   Procedure: ESOPHAGOGASTRODUODENOSCOPY (EGD);  Surgeon:  Louis Meckel, MD;  Location: [facility];  Service: Endoscopy;  Laterality: N/A;    Current Outpatient Prescriptions  Medication Sig Dispense Refill  . cetirizine (ZYRTEC) 10 MG tablet Take 10 mg by mouth every morning.     . Diclofenac Sodium (PENNSAID) 2 % SOLN Place 2 application onto the skin 2 (two) times daily. 112 g 3  . Multiple Vitamin (MULTIVITAMIN WITH MINERALS) TABS tablet Take 1 tablet by mouth daily.    . psyllium (REGULOID) 0.52 g capsule Take 208 g by mouth daily.    . varenicline (CHANTIX STARTING MONTH PAK) 0.5 MG X 11 & 1 MG X 42 tablet Take one 0.5 mg tablet by mouth once daily for 3 days, then increase to one 0.5 mg tablet twice daily for 4 days, then increase to one 1 mg tablet twice daily. 53 tablet 0  . Vitamin D, Ergocalciferol, (DRISDOL) 50000 units CAPS capsule Take 1 capsule (50,000 Units total) by mouth every 7 (seven) days. 12 capsule 0  . dexlansoprazole (DEXILANT) 60 MG capsule Take 1 capsule (60 mg total) by mouth daily. 90 capsule 3  . ranitidine (ZANTAC) 150 MG tablet Take 1 tablet (150 mg total) by mouth 2 (two) times daily. 60 tablet 1  . sucralfate (CARAFATE) 1 g tablet Take 1 tablet (1 g total) by mouth 4 (four) times daily. 120 tablet 3   No current facility-administered medications for this visit.     Allergies as of 08/03/2017 - Review Complete 08/03/2017  Allergen Reaction Noted  .  Mold extract [trichophyton]  05/15/2015    Family History  Problem Relation Age of Onset  . Breast cancer Maternal Grandmother   . Heart disease Father        valve replacement  . Hypertension Father   . GER disease Father   . Colon cancer Neg Hx     Social History   Social History  . Marital status: Married    Spouse name: N/A  . Number of children: 0  . 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 Smoking status: Former Smoker    Packs/day: 1.00    Years: 10.00    Types:  E-cigarettes, Cigarettes    Quit date: 11/24/2012  . Smokeless tobacco: Never Used     Comment: quit e cigarette 2016  . Alcohol use 0.0 oz/week     Comment: occasional  . Drug use: No  . Sexual activity: Yes    Partners: Female   Other Topics Concern  . Not on file   Social History Narrative   [facility] - [facility]   Lives with roommate/fiancee   Dr. Tora Perches stepson    Review of Systems:    Constitutional: No weight loss, fever or chills Cardiovascular: No chest pain Respiratory: No SOB Gastrointestinal: See HPI and otherwise negative   Physical Exam:  Vital signs: BP 120/60   Pulse 76   Ht  (1.753 m)   Wt 166 lb (75.3 kg)   BMI 24.51 kg/m   Constitutional:   Pleasant Caucasian male appears to be in NAD, Well developed, Well nourished, alert and cooperative Respiratory: Respirations even and unlabored. Lungs clear to auscultation bilaterally.   No wheezes, crackles, or rhonchi.  Cardiovascular: Normal S1, S2. No MRG. Regular rate and rhythm. No peripheral edema, cyanosis or pallor.  Gastrointestinal:  Soft, nondistended, mild epigastric ttp, No rebound or guarding. Normal bowel sounds. No appreciable masses or hepatomegaly. Psychiatric: Demonstrates good judgement and reason without abnormal affect or behaviors.  MOST RECENT LABS AND IMAGING: CBC    Component Value Date/Time   WBC 4.6 02/28/2017 0918   RBC 5.22 02/28/2017 0918   HGB 15.3 02/28/2017 0918   HCT 45.1 02/28/2017 0918   PLT 317.0 02/28/2017 0918   MCV 86.3 02/28/2017 0918   MCH 29.1 05/01/2015 0525   MCHC 33.9 02/28/2017 0918   RDW 12.9 02/28/2017 0918   LYMPHSABS 1.5 02/28/2017 0918   MONOABS 0.3 02/28/2017 0918   EOSABS 0.1 02/28/2017 0918   BASOSABS 0.0 02/28/2017 0918    CMP     Component Value Date/Time   NA 140 02/28/2017 0918   K 4.4 02/28/2017 0918   CL 105 02/28/2017 0918   CO2 31 02/28/2017 0918   GLUCOSE 105 (H) 02/28/2017 0918   BUN 16 02/28/2017 0918   CREATININE  0.98 02/28/2017 0918   CALCIUM 9.4 02/28/2017 0918   PROT 6.5 02/28/2017 0918   ALBUMIN 4.3 02/28/2017 0918   AST 26 02/28/2017 0918   ALT 41 02/28/2017 0918   ALKPHOS 40 02/28/2017 0918   BILITOT 0.3 02/28/2017 0918   GFRNONAA >60 05/01/2015 0525   GFRAA >60 05/01/2015 0525    Assessment: 1. GERD: Increased over the past 4-5 days, no help from Dexilant 120 mg daily at bedtime plus Mylanta and various other antacids 2. Epigastric abdominal pain: With above  Plan: 1. Discussed with the patient that we could proceed with EGD due to  a variance in his symptoms and a change in his overall feeling of heartburn and reflux. Patient tells me he would like to wait on this and try conservative measures first. 2. Prescribed Carafate 1 g up to 4 times a day, an hour before or 2 hours after eating and all other medications. 3. Patient should continue his Dexilant 60 mg every morning 4. Patient may also use Zantac 150 mg twice a day if needed on top of above therapy 5. We reviewed antireflux diet and lifestyle modifications including eating 3-4 hours before laying down at night 6. Did provide the patient with a handout regarding above 7. Patient requests a return appointment in clinic with Dr. Leone Payor at his next available appointment and will decide on need for EGD at that time  Hyacinth Meeker, PA-C [facility] 08/03/2017, 12:16 PM  Cc: Etta Grandchild, MD   Agree with Ms. Lenard Simmer evaluation and management.  Iva Boop, MD, [facility]

## 2017-08-03 NOTE — Patient Instructions (Signed)
We have sent the following medications to your pharmacy for you to pick up at your convenience:  Carafate 1 gram up to four times a day one hour before or 2 hours after you eat and all other medications.   Zantac 150 mg twice a day.  Continue Dexilant 60 mg every morning.   We have given you a handout on GERD.

## 2017-08-07 NOTE — Progress Notes (Signed)
[name] [address] [place], [place] [address] Phone: [phone] Subjective:    CC: Knee pain follow-up  [id number]  IKE MARAGH is a 31 y.o. male coming in with complaint of knee pain follow-up. Found to have patellofemoral syndrome as well as hypermobility syndrome. Family history of connective tissue disorder. Ehlers-Danlos Syndrome Patient elected try conservative therapy including home exercises, once weekly vitamin D, as well as topical anti-inflammatories. Patient states Doing 95% better. Not having any pain at all. Has not needed the topical anti-inflammatories. Continues to increase activity very slowly. Patient states and is running most of the 30 minute workout with no significant trouble. Working on doing the home exercises.      Past Medical History:  Diagnosis Date  . GERD (gastroesophageal reflux disease)   . Hemorrhoid   . Sebaceous cyst    Past Surgical History:  Procedure Laterality Date  . ESOPHAGOGASTRODUODENOSCOPY N/A 05/18/2015   Procedure: ESOPHAGOGASTRODUODENOSCOPY (EGD);  Surgeon: Louis Meckel, MD;  Location: [facility];  Service: Endoscopy;  Laterality: N/A;   Social History   Social History  . Marital status: Married    Spouse name: N/A  . Number of children: 0  . 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   Social History Main Topics  . Smoking status: Former Smoker    Packs/day: 1.00    Years: 10.00    Types: E-cigarettes, Cigarettes    Quit date: 11/24/2012  . Smokeless tobacco: Never Used     Comment: quit e cigarette 2016  . Alcohol use 0.0 oz/week     Comment: occasional  . Drug use: No  . Sexual activity: Yes    Partners: Female   Other Topics Concern  . 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 Tora Perches stepson   Allergies  Allergen Reactions  . Mold Extract [Trichophyton]     Sneezing,  runny nose.    Family History  Problem Relation Age of Onset  . Breast cancer Maternal Grandmother   . Heart disease Father        valve replacement  . Hypertension Father   . GER disease Father   . Colon cancer Neg Hx      Past medical history, social, surgical and family history all reviewed in electronic medical record.  No pertanent information unless stated regarding to the chief complaint.   Review of Systems:Review of systems updated and as accurate as of 08/08/17  No headache, visual changes, nausea, vomiting, diarrhea, constipation, dizziness, abdominal pain, skin rash, fevers, chills, night sweats, weight loss, swollen lymph nodes, body aches, joint swelling, muscle aches, chest pain, shortness of breath, mood changes.   Objective  Blood pressure 110/80, pulse 73, height  (1.753 m), weight 164 lb (74.4 kg), SpO2 99 %. Systems examined below as of 08/08/17   General: No apparent distress alert and oriented x3 mood and affect normal, dressed appropriately.  HEENT: Pupils equal, extraocular movements intact  Respiratory: Patient's speak in full sentences and does not appear short of breath  Cardiovascular: No lower extremity edema, non tender, no erythema  Skin: Warm dry intact with no signs of infection or rash on extremities or on axial skeleton.  Abdomen: Soft nontender  Neuro: Cranial nerves II through XII are intact, neurovascularly intact in all extremities with 2+ DTRs and 2+ pulses.  Lymph: No lymphadenopathy  of posterior or anterior cervical chain or axillae bilaterally.  Gait normal with good balance and coordination.  MSK:  Non tender with full range of motion and good stability and symmetric strength and tone of shoulders, elbows, wrist, hip, and ankles bilaterally. Hypermobility noted again. Knee: Bilateral Normal to inspection with no erythema or effusion or obvious bony abnormalities. Tender over the patellofemoral joint but improved from previous  exam ROM full in flexion and extension and lower leg rotation. Ligaments with solid consistent endpoints including ACL, PCL, LCL, MCL. Negative Mcmurray's, Apley's, and Thessalonian tests. Non painful patellar compression. Patellar glide without crepitus. Patellar and quadriceps tendons unremarkable. Hamstring and quadriceps strength is normal.     Impression and Recommendations:     This case required medical decision making of moderate complexity.      Note: This dictation was prepared with Dragon dictation along with smaller phrase technology. Any transcriptional errors that result from this process are unintentional.

## 2017-08-08 ENCOUNTER — Ambulatory Visit (INDEPENDENT_AMBULATORY_CARE_PROVIDER_SITE_OTHER): Payer: 59 | Admitting: Family Medicine

## 2017-08-08 ENCOUNTER — Encounter: Payer: Self-pay | Admitting: Family Medicine

## 2017-08-08 DIAGNOSIS — M222X2 Patellofemoral disorders, left knee: Secondary | ICD-10-CM

## 2017-08-08 DIAGNOSIS — M222X1 Patellofemoral disorders, right knee: Secondary | ICD-10-CM | POA: Diagnosis not present

## 2017-08-08 NOTE — Assessment & Plan Note (Signed)
Patient does have more of a patellofemoral syndrome bilaterally. Negative secondary to hypermobility. Responding very well to the conservative therapy including home exercises, icing regimen, which activities doing which ones to avoid. Patient is going to try to increase activity slowly over the course of time still. We discussed recovery as well as proper diet. Patient come back again as needed

## 2017-08-08 NOTE — Patient Instructions (Signed)
Good to see you  Ice is your friend Continue the knee brace with running When increasing running increase either time or intensity never both  When increasing weight then drop reps at first  Change vitamin D to 2000 IU daily  See me again in when you need me

## 2017-08-11 ENCOUNTER — Ambulatory Visit: Payer: Self-pay | Admitting: Family Medicine

## 2017-09-07 ENCOUNTER — Encounter: Payer: Self-pay | Admitting: Internal Medicine

## 2017-09-25 ENCOUNTER — Ambulatory Visit: Payer: Self-pay | Admitting: Internal Medicine

## 2017-10-05 NOTE — Telephone Encounter (Signed)
Copied from CRM [phone]. Topic: Quick Communication - Rx Refill/Question >> Oct 05, 2017  9:52 AM Viviann SpareWhite, Selina wrote: Has the patient contacted their pharmacy? Yes.   ranitidine (ZANTAC) 150 MG tablet   (Agent: If no, request that the patient contact the pharmacy for the refill.)  Preferred Pharmacy (with phone number or street name):  [facility] [id number] [place] - [address] AT [facility] [address] [place] [id number] Phone: [phone] Fax: [phone]   Agent: Please be advised that RX refills may take up to 3 business days. We ask that you follow-up with your pharmacy.

## 2017-10-09 ENCOUNTER — Telehealth: Payer: Self-pay | Admitting: Physician Assistant

## 2017-10-09 MED ORDER — RANITIDINE HCL 150 MG PO TABS
ORAL_TABLET | ORAL | 1 refills | Status: DC
Start: 2017-10-09 — End: 2017-12-06

## 2017-10-09 NOTE — Telephone Encounter (Signed)
Rx sent to pharmacy for Zantac with a 90 day refill. Pt informed.

## 2017-10-18 ENCOUNTER — Ambulatory Visit (INDEPENDENT_AMBULATORY_CARE_PROVIDER_SITE_OTHER): Payer: 59 | Admitting: Internal Medicine

## 2017-10-18 ENCOUNTER — Encounter: Payer: Self-pay | Admitting: Internal Medicine

## 2017-10-18 VITALS — BP 122/72 | HR 82 | Ht 69.0 in | Wt 169.6 lb

## 2017-10-18 DIAGNOSIS — K219 Gastro-esophageal reflux disease without esophagitis: Secondary | ICD-10-CM | POA: Diagnosis not present

## 2017-10-18 NOTE — Patient Instructions (Signed)
   Follow up with Dr Gessner in a year or sooner if needed.     I appreciate the opportunity to care for you. Carl Gessner, MD, FACG 

## 2017-10-18 NOTE — Progress Notes (Signed)
Bernard [name] 31 y.o. Jul 08, 1986 [id number]  Assessment & Plan:   Encounter Diagnosis  Name Primary?  . Gastroesophageal reflux disease without esophagitis Yes   The patient has classic heartburn and GERD symptoms.  He had a flare in October, and is now better.  He will continue Dexilant 60 mg daily and ranitidine 150 mg twice daily.  He will continue lifestyle changes.  We talked about the natural history of this, how it is very reassuring that he had a normal EGD in 2016 without any changes, perhaps he is someone that has just a very sensitive esophagus and increased transient lower esophageal sphincter relaxation's causing his problems.  As in the HPI I reviewed potential possible side effects and the controversies and known facts around that.  He also mentions stable hemorrhoid symptoms with rare bright red blood on the toilet paper and prolapse symptoms but says fiber supplementation generally keeps these under control.  I plan to see him back routinely in approximately 1 year, sooner as needed  I appreciate the opportunity to care for this patient. OZ:HYQMVCc:Jones, Bernadene Bellhomas L, MD   Subjective:   Chief Complaint:  HPI The patient is here unaccompanied for follow-up of reflux.  In October he was having quite a bit of symptomatology, despite taking Dexilant even twice a day.  He saw Hyacinth MeekerJennifer Lemmon PA-C and she prescribed ranitidine 150 mg twice a day and Carafate as well.  He is still on the ranitidine but stop the Carafate and is back to Dexilant 60 mg daily.  He reports his symptoms are under good control now unless he has dietary indiscretion such as tomato-based or citrus foods that cause problems.  There is no dysphagia.  He asks about potential side effects of PPI, we reviewed what is known in the literature Heather weak associations with some diseases but no proven cause and effect on large terms, though any medication may cause harm it is thought that he gets significant benefit  and really he is unwell with respect to symptoms when not taking PPI.  He relates that he originally started on omeprazole, and then had the upper endoscopy in 2016 after the omeprazole was not working so well, that was normal, performed by Dr. Arlyce DiceKaplan. Allergies  Allergen Reactions  . Mold Extract [Trichophyton]     Sneezing, runny nose.    Current Meds  Medication Sig  . cetirizine (ZYRTEC) 10 MG tablet Take 10 mg by mouth every morning.   Marland Kitchen. dexlansoprazole (DEXILANT) 60 MG capsule Take 1 capsule (60 mg total) by mouth daily.  . Diclofenac Sodium (PENNSAID) 2 % SOLN Place 2 application onto the skin 2 (two) times daily.  . Multiple Vitamin (MULTIVITAMIN WITH MINERALS) TABS tablet Take 1 tablet by mouth daily.  . psyllium (REGULOID) 0.52 g capsule Take 208 g by mouth daily.  . ranitidine (ZANTAC) 150 MG tablet TAKE 1 TABLET(150 MG) BY MOUTH TWICE DAILY   Past Medical History:  Diagnosis Date  . GERD (gastroesophageal reflux disease)   . Hemorrhoid   . Sebaceous cyst    Past Surgical History:  Procedure Laterality Date  . ESOPHAGOGASTRODUODENOSCOPY N/A 05/18/2015   Procedure: ESOPHAGOGASTRODUODENOSCOPY (EGD);  Surgeon: Louis Meckelobert D Kaplan, MD;  [facility];  Service: Endoscopy;  Laterality: N/A;  Review of Systems As above  Objective:   Physical Exam BP 122/72   Pulse 82   Ht 5\' 9"  (1.753 m)   Wt 169 lb 9.6 oz (76.9 kg)   BMI 25.05 kg/m  NAD  15 minutes time spent with patient > half in counseling coordination of care

## 2017-12-06 ENCOUNTER — Telehealth: Payer: Self-pay | Admitting: Internal Medicine

## 2017-12-06 MED ORDER — RANITIDINE HCL 150 MG PO TABS: ORAL_TABLET | ORAL | 1 refills | Status: DC

## 2017-12-06 NOTE — Telephone Encounter (Signed)
Ranitidine sent in as requested.

## 2017-12-06 NOTE — Telephone Encounter (Signed)
Patient states he needs medication [facility]. Pt last seen 12.26.18

## 2017-12-25 ENCOUNTER — Other Ambulatory Visit: Payer: Self-pay | Admitting: Internal Medicine

## 2017-12-25 DIAGNOSIS — K2100 Gastro-esophageal reflux disease with esophagitis, without bleeding: Secondary | ICD-10-CM

## 2018-03-01 ENCOUNTER — Encounter: Payer: 59 | Admitting: Internal Medicine

## 2018-03-15 ENCOUNTER — Other Ambulatory Visit (INDEPENDENT_AMBULATORY_CARE_PROVIDER_SITE_OTHER): Payer: 59

## 2018-03-15 ENCOUNTER — Ambulatory Visit (INDEPENDENT_AMBULATORY_CARE_PROVIDER_SITE_OTHER): Payer: 59 | Admitting: Internal Medicine

## 2018-03-15 ENCOUNTER — Encounter: Payer: Self-pay | Admitting: Internal Medicine

## 2018-03-15 VITALS — BP 128/88 | HR 99 | Temp 97.9°F | Resp 16 | Ht 69.0 in | Wt 167.0 lb

## 2018-03-15 DIAGNOSIS — K2100 Gastro-esophageal reflux disease with esophagitis, without bleeding: Secondary | ICD-10-CM

## 2018-03-15 DIAGNOSIS — Z0000 Encounter for general adult medical examination without abnormal findings: Secondary | ICD-10-CM

## 2018-03-15 DIAGNOSIS — E559 Vitamin D deficiency, unspecified: Secondary | ICD-10-CM

## 2018-03-15 DIAGNOSIS — I10 Essential (primary) hypertension: Secondary | ICD-10-CM

## 2018-03-15 LAB — URINALYSIS, ROUTINE W REFLEX MICROSCOPIC
Bilirubin Urine: NEGATIVE
Ketones, ur: NEGATIVE
Leukocytes, UA: NEGATIVE
Nitrite: NEGATIVE
RBC / HPF: NONE SEEN (ref 0–?)
Specific Gravity, Urine: >=1.03 — AB (ref 1.000–1.030)
Total Protein, Urine: NEGATIVE
Urine Glucose: NEGATIVE
Urobilinogen, UA: 0.2 (ref 0.0–1.0)
WBC, UA: NONE SEEN (ref 0–?)
pH: 6 (ref 5.0–8.0)

## 2018-03-15 LAB — COMPREHENSIVE METABOLIC PANEL
ALK PHOS: 43 U/L (ref 39–117)
ALT: 48 U/L (ref 0–53)
AST: 31 U/L (ref 0–37)
Albumin: 4.4 g/dL (ref 3.5–5.2)
BILIRUBIN TOTAL: 0.5 mg/dL (ref 0.2–1.2)
BUN: 19 mg/dL (ref 6–23)
CO2: 32 meq/L (ref 19–32)
Calcium: 9.8 mg/dL (ref 8.4–10.5)
Chloride: 104 mEq/L (ref 96–112)
Creatinine, Ser: 1.1 mg/dL (ref 0.40–1.50)
GFR: 82.05 mL/min (ref >60.00)
GLUCOSE: 96 mg/dL (ref 70–99)
Potassium: 4.6 mEq/L (ref 3.5–5.1)
Sodium: 140 mEq/L (ref 135–145)
TOTAL PROTEIN: 6.8 g/dL (ref 6.0–8.3)

## 2018-03-15 LAB — CBC WITH DIFFERENTIAL/PLATELET
BASOS ABS: 0.1 10*3/uL (ref 0.0–0.1)
Basophils Relative: 1.5 % (ref 0.0–3.0)
EOS PCT: 1.7 % (ref 0.0–5.0)
Eosinophils Absolute: 0.1 10*3/uL (ref 0.0–0.7)
HCT: 45.4 % (ref 39.0–52.0)
Hemoglobin: 15.4 g/dL (ref 13.0–17.0)
Lymphocytes Relative: 22.7 % (ref 12.0–46.0)
Lymphs Abs: 1.7 10*3/uL (ref 0.7–4.0)
MCHC: 34 g/dL (ref 30.0–36.0)
MCV: 87.2 fl (ref 78.0–100.0)
MONO ABS: 0.5 10*3/uL (ref 0.1–1.0)
Monocytes Relative: 6.8 % (ref 3.0–12.0)
NEUTROS ABS: 4.9 10*3/uL (ref 1.4–7.7)
Neutrophils Relative %: 67.3 % (ref 43.0–77.0)
PLATELETS: 313 10*3/uL (ref 150.0–400.0)
RBC: 5.21 Mil/uL (ref 4.22–5.81)
RDW: 13.1 % (ref 11.5–15.5)
WBC: 7.3 10*3/uL (ref 4.0–10.5)

## 2018-03-15 LAB — THYROID PANEL WITH TSH
FREE THYROXINE INDEX: 2.9 (ref 1.4–3.8)
T3 Uptake: 30 % (ref 22–35)
T4, Total: 9.8 ug/dL (ref 4.9–10.5)
TSH: 1.43 m[IU]/L (ref 0.40–4.50)

## 2018-03-15 LAB — LIPID PANEL
Cholesterol: 136 mg/dL (ref 0–200)
HDL: 44.6 mg/dL (ref >39.00)
LDL Cholesterol: 53 mg/dL (ref 0–99)
NonHDL: 91.17
Total CHOL/HDL Ratio: 3
Triglycerides: 191 mg/dL — ABNORMAL HIGH (ref 0.0–149.0)
VLDL: 38.2 mg/dL (ref 0.0–40.0)

## 2018-03-15 LAB — VITAMIN D 25 HYDROXY (VIT D DEFICIENCY, FRACTURES): VITD: 34.46 ng/mL (ref 30.00–100.00)

## 2018-03-15 NOTE — Patient Instructions (Signed)

## 2018-03-15 NOTE — Progress Notes (Signed)
Subjective:  Patient ID: Bernard [name], male    DOB: Mar 05, 1986  Age: 32 y.o. MRN: [id number]  CC: Annual Exam and Hypertension   HPI Bernard [name] presents for f/up - He feels well and offers no complaints.  He tells me his GERD is well controlled with a combination of a PPI and an H2 blocker.  He denies any recent episodes of heartburn, odynophagia, or dysphagia.  He has been under a lot of stress over the last 6 months as his wife lost a pregnancy to HELLP syndrome.  He uses nicotine through vaping.  He denies any recent episodes of headache/blurred vision/chest pain/shortness of breath/palpitations/edema/fatigue.  Outpatient Medications Prior to Visit  Medication Sig Dispense Refill  . cetirizine (ZYRTEC) 10 MG tablet Take 10 mg by mouth every morning.     Marland Kitchen DEXILANT 60 MG capsule TAKE 1 CAPSULE BY MOUTH  DAILY BEFORE BREAKFAST 90 capsule 1  . Multiple Vitamin (MULTIVITAMIN WITH MINERALS) TABS tablet Take 1 tablet by mouth daily.    . ranitidine (ZANTAC) 150 MG tablet TAKE 1 TABLET(150 MG) BY MOUTH TWICE DAILY 180 tablet 1  . dexlansoprazole (DEXILANT) 60 MG capsule Take 1 capsule (60 mg total) by mouth daily. 90 capsule 3  . Diclofenac Sodium (PENNSAID) 2 % SOLN Place 2 application onto the skin 2 (two) times daily. (Patient not taking: Reported on 03/15/2018) 112 g 3  . psyllium (REGULOID) 0.52 g capsule Take 208 g by mouth daily.    . sucralfate (CARAFATE) 1 g tablet Take 1 tablet (1 g total) by mouth 4 (four) times daily. (Patient not taking: Reported on 03/15/2018) 120 tablet 3   No facility-administered medications prior to visit.     ROS Review of Systems  All other systems reviewed and are negative.   Objective:  BP 128/88 (BP Location: Left Arm, Patient Position: Sitting, Cuff Size: Normal)   Pulse 99   Temp 97.9 F (36.6 C) (Oral)   Resp 16   Ht  (1.753 m)   Wt 167 lb (75.8 kg)   SpO2 97%   BMI 24.66 kg/m   BP Readings from Last 3 Encounters:    03/15/18 128/88  10/18/17 122/72  08/08/17 110/80    Wt Readings from Last 3 Encounters:  03/15/18 167 lb (75.8 kg)  10/18/17 169 lb 9.6 oz (76.9 kg)  08/08/17 164 lb (74.4 kg)    Physical Exam  Constitutional: He is oriented to person, place, and time. No distress.  HENT:  Mouth/Throat: Oropharynx is clear and moist. No oropharyngeal exudate.  Eyes: Conjunctivae are normal. No scleral icterus.  Neck: Normal range of motion. Neck supple. No JVD present.  Cardiovascular: Normal rate, regular rhythm and normal heart sounds. Exam reveals no friction rub.  No murmur heard. Pulmonary/Chest: Effort normal and breath sounds normal. He has no wheezes. He has no rales.  Abdominal: Soft. Bowel sounds are normal. He exhibits no mass. There is no hepatosplenomegaly. There is no tenderness.  Musculoskeletal: Normal range of motion. He exhibits no edema or deformity.  Lymphadenopathy:    He has no cervical adenopathy.  Neurological: He is alert and oriented to person, place, and time.  Skin: Skin is warm and dry. He is not diaphoretic.  Psychiatric: He has a normal mood and affect. His behavior is normal. Thought content normal.  Vitals reviewed.   Lab Results  Component Value Date   WBC 7.3 03/15/2018   HGB 15.4 03/15/2018   HCT 45.4 03/15/2018  PLT 313.0 03/15/2018   GLUCOSE 96 03/15/2018   CHOL 136 03/15/2018   TRIG 191.0 (H) 03/15/2018   HDL 44.60 03/15/2018   LDLCALC 53 03/15/2018   ALT 48 03/15/2018   AST 31 03/15/2018   NA 140 03/15/2018   K 4.6 03/15/2018   CL 104 03/15/2018   CREATININE 1.10 03/15/2018   BUN 19 03/15/2018   CO2 32 03/15/2018   TSH 1.43 03/15/2018   HGBA1C 5.6 02/28/2017    No results found.  Assessment & Plan:   [name] was seen today for annual exam and hypertension.  Diagnoses and all orders for this visit:  Gastroesophageal reflux disease with esophagitis- His symptoms are well controlled.  No complications noted. -     CBC with  Differential/Platelet; Future  Routine general medical examination at a health care facility- Exam completed, labs reviewed, vaccines reviewed, patient education material was given. -     Lipid panel; Future  Essential hypertension- His labs are negative for endorgan damage.  He does have mild vitamin D insufficiency so I will treat that.  At this time he is not willing to start an antihypertensive.  I encouraged him to stop using nicotine. -     CBC with Differential/Platelet; Future -     Comprehensive metabolic panel; Future -     Thyroid Panel With TSH; Future -     VITAMIN D 25 Hydroxy (Vit-D Deficiency, Fractures); Future -     Urinalysis, Routine w reflex microscopic; Future  Vitamin D insufficiency -     Cholecalciferol 2000 units TABS; Take 1 tablet (2,000 Units total) by mouth daily.   I have discontinued Jarion R. Balducci's dexlansoprazole. I am also having him start on Cholecalciferol. Additionally, I am having him maintain his cetirizine, Diclofenac Sodium, multivitamin with minerals, psyllium, sucralfate, ranitidine, and DEXILANT.  Meds ordered this encounter  Medications  . Cholecalciferol 2000 units TABS    Sig: Take 1 tablet (2,000 Units total) by mouth daily.    Dispense:  90 tablet    Refill:  1     Follow-up: Return in about 6 months (around 09/15/2018).  Sanda Linger, MD

## 2018-03-16 ENCOUNTER — Encounter: Payer: Self-pay | Admitting: Internal Medicine

## 2018-03-16 DIAGNOSIS — E559 Vitamin D deficiency, unspecified: Secondary | ICD-10-CM | POA: Insufficient documentation

## 2018-03-16 MED ORDER — CHOLECALCIFEROL 50 MCG (2000 UT) PO TABS: 1.0000 | ORAL_TABLET | Freq: Every day | ORAL | 1 refills | Status: DC

## 2018-04-04 DIAGNOSIS — L309 Dermatitis, unspecified: Secondary | ICD-10-CM | POA: Diagnosis not present

## 2018-04-06 ENCOUNTER — Encounter: Payer: Self-pay | Admitting: Physician Assistant

## 2018-04-06 ENCOUNTER — Ambulatory Visit: Payer: 59 | Admitting: Physician Assistant

## 2018-04-06 VITALS — BP 110/80 | HR 83 | Temp 98.6°F | Resp 16 | Ht 71.0 in | Wt 162.4 lb

## 2018-04-06 DIAGNOSIS — R21 Rash and other nonspecific skin eruption: Secondary | ICD-10-CM | POA: Diagnosis not present

## 2018-04-06 MED ORDER — PREDNISONE 10 MG PO TABS: ORAL_TABLET | ORAL | 0 refills | Status: DC

## 2018-04-06 NOTE — Progress Notes (Signed)
Bernard HughsShaman R Behne  MRN: [id number] DOB: 23-Sep-1986  Subjective:  Bernard [name] is a 32 y.o. male seen in office today for a chief complaint of rash involving the bilateral arms. Rash started 4 days ago. Lesions are red, and raised in texture. Rash has changed over time. Rash is painful and is pruritic. Associated symptoms: none. Patient denies: abdominal pain, arthralgia, congestion, cough, decrease in appetite, decrease in energy level, fever, headache, irritability, myalgia, nausea, sore throat and vomiting. Patient has not had contacts with similar rash. Patient was out in the yard a few days before rash appeared doing lots of yard work. The day it appeared he was doing wrestling and had lots of skin to skin contact. No other new exposures to soaps, lotions, laundry detergents, foods, medications, insects or animals. He had an e-visit with doctor couple days ago and was treated with Diflucan and clotrimazole cream for possible fungal infection.  He has not had any improvement with the medication and notes the rash has continued to spread. No PMH of HTN or diabetes.   Review of Systems  Per HPI  Patient Active Problem List   Diagnosis Date Noted  . Vitamin D insufficiency 03/16/2018  . Essential hypertension 03/15/2018  . Patellofemoral syndrome of both knees 06/09/2017  . Benign hypermobility syndrome 06/09/2017  . Hyperglycemia 02/28/2017  . Nicotine dependence 02/28/2017  . Hemorrhoids 12/23/2015  . Routine general medical examination at a health care facility 02/16/2015  . GASTROESOPHAGEAL REFLUX DISEASE 12/29/2007    Current Outpatient Medications on File Prior to Visit  Medication Sig Dispense Refill  . cetirizine (ZYRTEC) 10 MG tablet Take 10 mg by mouth every morning.     . Cholecalciferol 2000 units TABS Take 1 tablet (2,000 Units total) by mouth daily. 90 tablet 1  . DEXILANT 60 MG capsule TAKE 1 CAPSULE BY MOUTH  DAILY BEFORE BREAKFAST 90 capsule 1  . Diclofenac Sodium  (PENNSAID) 2 % SOLN Place 2 application onto the skin 2 (two) times daily. (Patient not taking: Reported on 03/15/2018) 112 g 3  . Multiple Vitamin (MULTIVITAMIN WITH MINERALS) TABS tablet Take 1 tablet by mouth daily.    . psyllium (REGULOID) 0.52 g capsule Take 208 g by mouth daily.    . ranitidine (ZANTAC) 150 MG tablet TAKE 1 TABLET(150 MG) BY MOUTH TWICE DAILY 180 tablet 1  . sucralfate (CARAFATE) 1 g tablet Take 1 tablet (1 g total) by mouth 4 (four) times daily. (Patient not taking: Reported on 03/15/2018) 120 tablet 3   No current facility-administered medications on file prior to visit.     Allergies  Allergen Reactions  . Mold Extract [Trichophyton]     Sneezing, runny nose.      Objective:  BP 110/80   Pulse 83   Temp 98.6 F (37 C) (Oral)   Resp 16   Ht 5\' 11"  (1.803 m)   Wt 162 lb 6.4 oz (73.7 kg)   SpO2 97%   BMI 22.65 kg/m   Physical Exam  Constitutional: He is oriented to person, place, and time. He appears well-developed and well-nourished. No distress.  HENT:  Head: Normocephalic and atraumatic.  Eyes: Conjunctivae are normal.  Neck: Normal range of motion.  Pulmonary/Chest: Effort normal.  Neurological: He is alert and oriented to person, place, and time.  Skin: Skin is warm and dry. Rash (multple affected areas with erythematous streak-like vesicular rash on bilateral arms, left forearm with large affected area ) noted.     Psychiatric:  He has a normal mood and affect.  Vitals reviewed.   Assessment and Plan :  1. Rash and nonspecific skin eruption Hx and PE most consistent with poison ivy dermatitis. Due to diffuse involvement on arms, rec oral steroid taper and topical calamine lotion for relief. Advised to return to clinic if symptoms worsen, do not improve, or as needed.  turn to clinic if symptoms worsen, do not improve, or as needed.  - predniSONE (DELTASONE) 10 MG tablet; 6-6-5-5-4-4-3-3-2-2-2-1-1-1 taper. Take all pills for that day in the am  with food.  Dispense: 45 tablet; Refill: 0   Side effects, risks, benefits, and alternatives of the medications and treatment plan prescribed today were discussed, and patient expressed understanding of the instructions given. No barriers to understanding were identified. Red flags discussed in detail. Pt expressed understanding regarding what to do in case of emergency/urgent symptoms.  Benjiman Core PA-C  Primary Care at [facility] 04/06/2018 8:20 AM

## 2018-04-06 NOTE — Patient Instructions (Addendum)
Your rash is most consistent with poison ivy.   Please take prednisone as prescribed. Prednisone is a steroid and can cause side effects such as headache, irritability, nausea, vomiting, increased heart rate, increased blood pressure, increased blood sugar, appetite changes, and insomnia. Please take tablets in the morning with a full meal to help decrease the chances of these side effects.   Take Benadryl at bedtime, as it causes drowsiness.You may use an over-the-counter antihistamine (Claritin, Zyrtec, Allegra) for daytime symptoms, as well as over-the-counter calamine lotion.Stay as cool and dry as you can; taking hot showers, getting hot and sweaty or wearing tight clothing can aggravate the itching.  Poison Ivy Dermatitis Poison ivy dermatitis is redness and soreness (inflammation) of the skin. It is caused by a chemical that is found on the leaves of the poison ivy plant. You may also have itching, a rash, and blisters. Symptoms often clear up in 1-2 weeks. You may get this condition by touching a poison ivy plant. You can also get it by touching something that has the chemical on it. This may include animals or objects that have come in contact with the plant. Follow these instructions at home: General instructions  Take or apply over-the-counter and prescription medicines only as told by your doctor.  If you touch poison ivy, wash your skin with soap and cold water right away.  Use hydrocortisone creams or calamine lotion as needed to help with itching.  Take oatmeal baths as needed. Use colloidal oatmeal. You can get this at a pharmacy or grocery store. Follow the instructions on the package.  Do not scratch or rub your skin.  While you have the rash, wash your clothes right after you wear them. Prevention  Know what poison ivy looks like so you can avoid it. This plant has three leaves with flowering branches on a single stem. The leaves are glossy. They have uneven edges that  come to a point at the front.  If you have touched poison ivy, wash with soap and water right away. Be sure to wash under your fingernails.  When hiking or camping, wear long pants, a long-sleeved shirt, tall socks, and hiking boots. You can also use a lotion on your skin that helps to prevent contact with the chemical on the plant.  If you think that your clothes or outdoor gear came in contact with poison ivy, rinse them off with a garden hose before you bring them inside your house. Contact a doctor if:  You have open sores in the rash area.  You have more redness, swelling, or pain in the affected area.  You have redness that spreads beyond the rash area.  You have fluid, blood, or pus coming from the affected area.  You have a fever.  You have a rash over a large area of your body.  You have a rash on your eyes, mouth, or genitals.  Your rash does not get better after a few days. Get help right away if:  Your face swells or your eyes swell shut.  You have trouble breathing.  You have trouble swallowing. This information is not intended to replace advice given to you by your health care provider. Make sure you discuss any questions you have with your health care provider. Document Released: 11/12/2010 Document Revised: 03/17/2016 Document Reviewed: 03/18/2015 Elsevier Interactive Patient Education  2018 [employer]    IF you received an x-ray today, you will receive an invoice from [facility]. Please contact [facility] at [phone] with questions or concerns regarding your invoice.   IF you received labwork today, you will receive an invoice from [facility]. Please contact [facility] at [phone] with questions or concerns regarding your invoice.   Our billing staff will not be able to assist you with questions regarding bills from these companies.  You will be contacted with the lab results as soon as they are available. The fastest way to get  your results is to activate your My Chart account. Instructions are located on the last page of this paperwork. If you have not heard from [place] regarding the results in 2 weeks, please contact this office.

## 2018-05-01 ENCOUNTER — Encounter: Payer: Self-pay | Admitting: Family Medicine

## 2018-05-01 ENCOUNTER — Ambulatory Visit: Payer: 59 | Admitting: Family Medicine

## 2018-05-01 ENCOUNTER — Ambulatory Visit (INDEPENDENT_AMBULATORY_CARE_PROVIDER_SITE_OTHER): Payer: 59

## 2018-05-01 VITALS — BP 128/66 | HR 86 | Ht 71.0 in | Wt 163.0 lb

## 2018-05-01 DIAGNOSIS — S4991XA Unspecified injury of right shoulder and upper arm, initial encounter: Secondary | ICD-10-CM

## 2018-05-01 NOTE — Patient Instructions (Signed)
Nice to meet you  Please try ice and compression on the area Please let me know if the pain is still occurring and I can put an xray in
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
## 2018-06-11 ENCOUNTER — Ambulatory Visit (INDEPENDENT_AMBULATORY_CARE_PROVIDER_SITE_OTHER): Payer: 59

## 2018-06-11 ENCOUNTER — Ambulatory Visit: Payer: 59 | Admitting: Family Medicine

## 2018-06-11 ENCOUNTER — Encounter: Payer: Self-pay | Admitting: Family Medicine

## 2018-06-11 ENCOUNTER — Other Ambulatory Visit: Payer: Self-pay

## 2018-06-11 VITALS — BP 128/64 | HR 76 | Ht 71.0 in | Wt 163.0 lb

## 2018-06-11 DIAGNOSIS — R0789 Other chest pain: Secondary | ICD-10-CM

## 2018-06-11 MED ORDER — RANITIDINE HCL 150 MG PO TABS: ORAL_TABLET | ORAL | 1 refills | Status: DC
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
## 2018-06-11 NOTE — Telephone Encounter (Signed)
Patient seen in December 2018. Ranitidine refilled as requested.
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
## 2018-06-11 NOTE — Assessment & Plan Note (Signed)
Having left-sided chest wall pain after being struck in the chest during martial arts.  No ecchymosis.  Pain is located over where the costal cartilage and the red meet.  No suggestion of pneumothorax on exam today. -Counseled on supportive care. -Counseled on pain control. -Given indications for immediate care and follow-up.  If no improvement would get an x-ray and may need to CT.

## 2018-07-17 ENCOUNTER — Other Ambulatory Visit: Payer: Self-pay | Admitting: Internal Medicine

## 2018-07-17 DIAGNOSIS — K2100 Gastro-esophageal reflux disease with esophagitis, without bleeding: Secondary | ICD-10-CM

## 2018-07-24 ENCOUNTER — Telehealth: Payer: Self-pay | Admitting: Internal Medicine

## 2018-07-24 MED ORDER — FAMOTIDINE 20 MG PO TABS: 20.0000 mg | ORAL_TABLET | Freq: Two times a day (BID) | ORAL | 3 refills | Status: DC

## 2018-07-24 NOTE — Telephone Encounter (Signed)
Change to famotidine 20 mg bid # 90 3 refills

## 2018-07-24 NOTE — Telephone Encounter (Signed)
Spoke with Song and informed him of what we would be sending in to replace the generic zantac. Confirmed pharmacy and he will call back with any problems.

## 2018-07-24 NOTE — Telephone Encounter (Signed)
Patient states he has been hearing that medication zantac can be harmful and wants to know if Dr.Gessner thinks he should be taking something else.

## 2018-07-24 NOTE — Telephone Encounter (Signed)
Please advise Sir, thank you. 

## 2018-08-17 DIAGNOSIS — J1189 Influenza due to unidentified influenza virus with other manifestations: Secondary | ICD-10-CM | POA: Diagnosis not present

## 2018-09-13 ENCOUNTER — Encounter: Payer: Self-pay | Admitting: Internal Medicine

## 2018-09-13 ENCOUNTER — Ambulatory Visit: Payer: 59 | Admitting: Internal Medicine

## 2018-09-13 VITALS — BP 124/70 | HR 90 | Temp 97.9°F | Resp 16 | Ht 71.0 in | Wt 162.5 lb

## 2018-09-13 DIAGNOSIS — I10 Essential (primary) hypertension: Secondary | ICD-10-CM | POA: Diagnosis not present

## 2018-09-13 DIAGNOSIS — K219 Gastro-esophageal reflux disease without esophagitis: Secondary | ICD-10-CM

## 2018-09-13 NOTE — Patient Instructions (Signed)
Gastroesophageal Reflux Disease, Adult Normally, food travels down the esophagus and stays in the stomach to be digested. However, when a person has gastroesophageal reflux disease (GERD), food and stomach acid move back up into the esophagus. When this happens, the esophagus becomes sore and inflamed. Over time, GERD can create small holes (ulcers) in the lining of the esophagus. What are the causes? This condition is caused by a problem with the muscle between the esophagus and the stomach (lower esophageal sphincter, or LES). Normally, the LES muscle closes after food passes through the esophagus to the stomach. When the LES is weakened or abnormal, it does not close properly, and that allows food and stomach acid to go back up into the esophagus. The LES can be weakened by certain dietary substances, medicines, and medical conditions, including:  Tobacco use.  Pregnancy.  Having a hiatal hernia.  Heavy alcohol use.  Certain foods and beverages, such as coffee, chocolate, onions, and peppermint.  What increases the risk? This condition is more likely to develop in:  People who have an increased body weight.  People who have connective tissue disorders.  People who use NSAID medicines.  What are the signs or symptoms? Symptoms of this condition include:  Heartburn.  Difficult or painful swallowing.  The feeling of having a lump in the throat.  Abitter taste in the mouth.  Bad breath.  Having a large amount of saliva.  Having an upset or bloated stomach.  Belching.  Chest pain.  Shortness of breath or wheezing.  Ongoing (chronic) cough or a night-time cough.  Wearing away of tooth enamel.  Weight loss.  Different conditions can cause chest pain. Make sure to see your health care provider if you experience chest pain. How is this diagnosed? Your health care provider will take a medical history and perform a physical exam. To determine if you have mild or severe  GERD, your health care provider may also monitor how you respond to treatment. You may also have other tests, including:  An endoscopy toexamine your stomach and esophagus with a small camera.  A test thatmeasures the acidity level in your esophagus.  A test thatmeasures how much pressure is on your esophagus.  A barium swallow or modified barium swallow to show the shape, size, and functioning of your esophagus.  How is this treated? The goal of treatment is to help relieve your symptoms and to prevent complications. Treatment for this condition may vary depending on how severe your symptoms are. Your health care provider may recommend:  Changes to your diet.  Medicine.  Surgery.  Follow these instructions at home: Diet  Follow a diet as recommended by your health care provider. This may involve avoiding foods and drinks such as: ? Coffee and tea (with or without caffeine). ? Drinks that containalcohol. ? Energy drinks and sports drinks. ? Carbonated drinks or sodas. ? Chocolate and cocoa. ? Peppermint and mint flavorings. ? Garlic and onions. ? Horseradish. ? Spicy and acidic foods, including peppers, chili powder, curry powder, vinegar, hot sauces, and barbecue sauce. ? Citrus fruit juices and citrus fruits, such as oranges, lemons, and limes. ? Tomato-based foods, such as red sauce, chili, salsa, and pizza with red sauce. ? Fried and fatty foods, such as donuts, french fries, potato chips, and high-fat dressings. ? High-fat meats, such as hot dogs and fatty cuts of red and white meats, such as rib eye steak, sausage, ham, and bacon. ? High-fat dairy items, such as whole milk,   butter, and cream cheese.  Eat small, frequent meals instead of large meals.  Avoid drinking large amounts of liquid with your meals.  Avoid eating meals during the 2-3 hours before bedtime.  Avoid lying down right after you eat.  Do not exercise right after you eat. General  instructions  Pay attention to any changes in your symptoms.  Take over-the-counter and prescription medicines only as told by your health care provider. Do not take aspirin, ibuprofen, or other NSAIDs unless your health care provider told you to do so.  Do not use any tobacco products, including cigarettes, chewing tobacco, and e-cigarettes. If you need help quitting, ask your health care provider.  Wear loose-fitting clothing. Do not wear anything tight around your waist that causes pressure on your abdomen.  Raise (elevate) the head of your bed 6 inches (15cm).  Try to reduce your stress, such as with yoga or meditation. If you need help reducing stress, ask your health care provider.  If you are overweight, reduce your weight to an amount that is healthy for you. Ask your health care provider for guidance about a safe weight loss goal.  Keep all follow-up visits as told by your health care provider. This is important. Contact a health care provider if:  You have new symptoms.  You have unexplained weight loss.  You have difficulty swallowing, or it hurts to swallow.  You have wheezing or a persistent cough.  Your symptoms do not improve with treatment.  You have a hoarse voice. Get help right away if:  You have pain in your arms, neck, jaw, teeth, or back.  You feel sweaty, dizzy, or light-headed.  You have chest pain or shortness of breath.  You vomit and your vomit looks like blood or coffee grounds.  You faint.  Your stool is bloody or black.  You cannot swallow, drink, or eat. This information is not intended to replace advice given to you by your health care provider. Make sure you discuss any questions you have with your health care provider. Document Released: 07/20/2005 Document Revised: 03/09/2016 Document Reviewed: 02/04/2015 Elsevier Interactive Patient Education  2018 Elsevier Inc.  

## 2018-09-13 NOTE — Progress Notes (Signed)
Subjective:  Patient ID: Bernard [name], male    DOB: 1985/11/27  Age: 32 y.o. MRN: [id number]  CC: Hypertension and Gastroesophageal Reflux   HPI Sinclair Alligood Nazareno presents for f/up - He has felt well recently.  His heartburn has been well controlled with the combination of an H2 blocker and a PPI.  He tells me his blood pressure has been well controlled with lifestyle modifications.  Outpatient Medications Prior to Visit  Medication Sig Dispense Refill  . cetirizine (ZYRTEC) 10 MG tablet Take 10 mg by mouth every morning.     Marland Kitchen DEXILANT 60 MG capsule TAKE 1 CAPSULE BY MOUTH  DAILY BEFORE BREAKFAST 90 capsule 1  . famotidine (PEPCID) 20 MG tablet Take 1 tablet (20 mg total) by mouth 2 (two) times daily. 180 tablet 3  . Multiple Vitamin (MULTIVITAMIN WITH MINERALS) TABS tablet Take 1 tablet by mouth daily.     No facility-administered medications prior to visit.     ROS Review of Systems  Constitutional: Negative for diaphoresis, fatigue and unexpected weight change.  HENT: Negative.  Negative for trouble swallowing.   Eyes: Negative for visual disturbance.  Respiratory: Negative for cough, chest tightness, shortness of breath and wheezing.   Cardiovascular: Negative for chest pain, palpitations and leg swelling.  Gastrointestinal: Negative.  Negative for abdominal pain, constipation, diarrhea, nausea and vomiting.  Endocrine: Negative.   Genitourinary: Negative.  Negative for difficulty urinating.  Musculoskeletal: Negative.   Skin: Negative.   Neurological: Negative for dizziness, weakness and light-headedness.  Hematological: Negative for adenopathy. Does not bruise/bleed easily.  Psychiatric/Behavioral: Negative.     Objective:  BP 124/70 (BP Location: Left Arm, Patient Position: Sitting, Cuff Size: Normal)   Pulse 90   Temp 97.9 F (36.6 C) (Oral)   Resp 16   Ht 5\' 11"  (1.803 m)   Wt 162 lb 8 oz (73.7 kg)   SpO2 98%   BMI 22.66 kg/m   BP Readings from Last 3  Encounters:  09/13/18 124/70  06/11/18 128/64  05/01/18 128/66    Wt Readings from Last 3 Encounters:  09/13/18 162 lb 8 oz (73.7 kg)  06/11/18 163 lb (73.9 kg)  05/01/18 163 lb (73.9 kg)    Physical Exam  Constitutional: He is oriented to person, place, and time. No distress.  HENT:  Mouth/Throat: Oropharynx is clear and moist. No oropharyngeal exudate.  Eyes: Conjunctivae are normal. No scleral icterus.  Neck: Normal range of motion. Neck supple. No JVD present. No thyromegaly present.  Cardiovascular: Normal rate, regular rhythm and normal heart sounds.  No murmur heard. Pulmonary/Chest: Effort normal and breath sounds normal. No respiratory distress. He has no wheezes. He has no rhonchi. He has no rales.  Abdominal: Soft. Bowel sounds are normal. He exhibits no mass. There is no hepatosplenomegaly. There is no tenderness.  Musculoskeletal: Normal range of motion. He exhibits no edema, tenderness or deformity.  Lymphadenopathy:    He has no cervical adenopathy.  Neurological: He is alert and oriented to person, place, and time.  Skin: Skin is warm and dry. No rash noted. He is not diaphoretic.  Vitals reviewed.   Lab Results  Component Value Date   WBC 7.3 03/15/2018   HGB 15.4 03/15/2018   HCT 45.4 03/15/2018   PLT 313.0 03/15/2018   GLUCOSE 96 03/15/2018   CHOL 136 03/15/2018   TRIG 191.0 (H) 03/15/2018   HDL 44.60 03/15/2018   LDLCALC 53 03/15/2018   ALT 48 03/15/2018   AST  31 03/15/2018   NA 140 03/15/2018   K 4.6 03/15/2018   CL 104 03/15/2018   CREATININE 1.10 03/15/2018   BUN 19 03/15/2018   CO2 32 03/15/2018   TSH 1.43 03/15/2018   HGBA1C 5.6 02/28/2017    No results found.  Assessment & Plan:   Alexis FrockShaman was seen today for hypertension and gastroesophageal reflux.  Diagnoses and all orders for this visit:  Gastroesophageal reflux disease without esophagitis- His symptoms are well controlled.  Will continue the combination of a PPI and H2  blocker.  Essential hypertension- His BP is adequately well controlled with lifestyle modifications.  Medical therapy is not indicated.   I am having Altariq R. Kinnamon maintain his cetirizine, multivitamin with minerals, DEXILANT, and famotidine.  No orders of the defined types were placed in this encounter.    Follow-up: No follow-ups on file.  Sanda Lingerhomas Devani Odonnel, MD

## 2018-09-26 DIAGNOSIS — H5213 Myopia, bilateral: Secondary | ICD-10-CM | POA: Diagnosis not present

## 2018-09-28 DIAGNOSIS — J069 Acute upper respiratory infection, unspecified: Secondary | ICD-10-CM | POA: Diagnosis not present

## 2018-12-07 DIAGNOSIS — J069 Acute upper respiratory infection, unspecified: Secondary | ICD-10-CM | POA: Diagnosis not present

## 2018-12-11 ENCOUNTER — Other Ambulatory Visit: Payer: Self-pay | Admitting: Internal Medicine

## 2018-12-11 DIAGNOSIS — K2100 Gastro-esophageal reflux disease with esophagitis, without bleeding: Secondary | ICD-10-CM

## 2018-12-23 ENCOUNTER — Encounter: Payer: Self-pay | Admitting: Internal Medicine

## 2018-12-23 DIAGNOSIS — K2100 Gastro-esophageal reflux disease with esophagitis, without bleeding: Secondary | ICD-10-CM

## 2018-12-24 MED ORDER — DEXLANSOPRAZOLE 60 MG PO CPDR: 60.0000 mg | DELAYED_RELEASE_CAPSULE | Freq: Every day | ORAL | 0 refills | Status: DC

## 2019-04-12 ENCOUNTER — Emergency Department (HOSPITAL_COMMUNITY)
Admission: EM | Admit: 2019-04-12 | Discharge: 2019-04-12 | Disposition: A | Discharge status: Home / Self Care | Payer: Worker's Compensation | Attending: Emergency Medicine | Admitting: Emergency Medicine

## 2019-04-12 ENCOUNTER — Encounter (HOSPITAL_COMMUNITY): Payer: Self-pay

## 2019-04-12 ENCOUNTER — Other Ambulatory Visit: Payer: Self-pay

## 2019-04-12 DIAGNOSIS — Z87891 Personal history of nicotine dependence: Secondary | ICD-10-CM | POA: Diagnosis not present

## 2019-04-12 DIAGNOSIS — I10 Essential (primary) hypertension: Secondary | ICD-10-CM | POA: Insufficient documentation

## 2019-04-12 DIAGNOSIS — Z041 Encounter for examination and observation following transport accident: Secondary | ICD-10-CM | POA: Insufficient documentation

## 2019-04-12 DIAGNOSIS — Z79899 Other long term (current) drug therapy: Secondary | ICD-10-CM | POA: Insufficient documentation

## 2019-04-12 DIAGNOSIS — Z0283 Encounter for blood-alcohol and blood-drug test: Secondary | ICD-10-CM | POA: Diagnosis present
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
## 2019-04-12 NOTE — ED Notes (Signed)
PT DISCHARGED. INSTRUCTIONS GIVEN. AAOX4. PT IN NO APPARENT DISTRESS OR PAIN. THE OPPORTUNITY TO ASK QUESTIONS WAS PROVIDED. 
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
## 2019-04-27 ENCOUNTER — Encounter: Payer: Self-pay | Admitting: Internal Medicine

## 2019-04-28 ENCOUNTER — Ambulatory Visit (INDEPENDENT_AMBULATORY_CARE_PROVIDER_SITE_OTHER)
Admission: RE | Admit: 2019-04-28 | Discharge: 2019-04-28 | Disposition: A | Discharge status: Home / Self Care | Payer: 59 | Source: Ambulatory Visit

## 2019-04-28 DIAGNOSIS — Z760 Encounter for issue of repeat prescription: Secondary | ICD-10-CM | POA: Diagnosis not present

## 2019-04-28 DIAGNOSIS — K649 Unspecified hemorrhoids: Secondary | ICD-10-CM

## 2019-04-28 MED ORDER — HYDROCORTISONE ACETATE 25 MG RE SUPP: 25.0000 mg | Freq: Two times a day (BID) | RECTAL | 0 refills | Status: DC

## 2019-04-28 NOTE — ED Provider Notes (Signed)
Virtual Visit via Video Note:  Bernard [name]  initiated request for Telemedicine visit with [facility] team. I connected with Bernard [name]  on 04/28/2019 at 11:42 AM  for a synchronized telemedicine visit using a video enabled HIPPA compliant telemedicine application. I verified that I am speaking with Bernard [name]  using two identifiers. [place] Hall-Potvin, PA-C  was physically located in a [facility] site and MICHOLAS DRUMWRIGHT was located at a different location.   The limitations of evaluation and management by telemedicine as well as the availability of in-person appointments were discussed. Patient was informed that he  may incur a bill ( including co-pay) for this virtual visit encounter. Bernard [name]  expressed understanding and gave verbal consent to proceed with virtual visit.     History of Present Illness:Bernard [name]  is a 33 y.o. male history of hypertension, GERD, external hemorrhoids, presents with acute concern for external hemorrhoid.  Patient is endorsing perianal pain, swelling that is worse with bowel movements, prolonged standing, sitting or walking.  Patient is followed by GI routinely for his GERD, this states that he reached out to this provider for refill of Anusol, though has not heard back yet.  Patient states he had to use Anusol few years ago when this happened.  Patient denies foreign body sensation, perianal protrusion, blood in stool, melena, bright red blood on toilet paper.  Patient has not been doing sitz bath's, this states the Anusol provided relief last time he had this happen.  Patient has never had external thrombosed hemorrhoid to his recollection, has never had to have any procedures done for his hemorrhoids.  Patient denies difficulty urinating, burning with urination, urinary frequency, urinary retention, dribbling.  Past Medical History:  Diagnosis Date  . GERD (gastroesophageal reflux disease)   . Hemorrhoid   .  Sebaceous cyst     Allergies  Allergen Reactions  . Mold Extract [Trichophyton]     Sneezing, runny nose.         Observations/Objective: 33 year old male sitting in no acute distress.    Assessment and Plan: 33 year old male with history of GERD, hemorrhoids, hypertension presenting for hemorrhoid flare.  Unable to do exam due to virtual visit, agreeable to refilling Anusol as there are no alarm symptoms present and patient appears well.  Encourage patient to also use sitz bath's for additional relief as well as to follow-up with surgery office should symptoms not improve over the next 24 hours as may be an external thrombosed hemorrhoid which would require incision.  Patient verbalized understanding, is agreeable to plan.  Follow Up Instructions: Patient to follow-up with surgery should symptoms not improve further evaluation and treatment.   I discussed the assessment and treatment plan with the patient. The patient was provided an opportunity to ask questions and all were answered. The patient agreed with the plan and demonstrated an understanding of the instructions.   The patient was advised to call back or seek an in-person evaluation if the symptoms worsen or if the condition fails to improve as anticipated.  I provided 15 minutes of non-face-to-face time during this encounter.    [place] Hall-Potvin, PA-C  04/28/2019 11:42 AM        Hall-Potvin, [place], PA-C 04/28/19 1919

## 2019-04-28 NOTE — Discharge Instructions (Addendum)
Use Anusol as prescribed. May also do sits baths, do not use Epson salt as this will cause burning sensation. Call surgery office for further evaluation and treatment should symptoms persist. Return for in-person evaluation for significant pain, bleeding, fever, abdominal pain.

## 2019-04-29 ENCOUNTER — Other Ambulatory Visit: Payer: Self-pay | Admitting: Internal Medicine

## 2019-04-30 ENCOUNTER — Encounter: Payer: Self-pay | Admitting: Family Medicine

## 2019-04-30 ENCOUNTER — Ambulatory Visit: Payer: 59 | Admitting: Family Medicine

## 2019-04-30 ENCOUNTER — Other Ambulatory Visit: Payer: Self-pay

## 2019-04-30 ENCOUNTER — Ambulatory Visit: Payer: Self-pay

## 2019-04-30 VITALS — BP 126/80 | HR 77 | Ht 69.0 in | Wt 165.0 lb

## 2019-04-30 DIAGNOSIS — R0781 Pleurodynia: Secondary | ICD-10-CM | POA: Insufficient documentation

## 2019-04-30 MED ORDER — TRAMADOL HCL 50 MG PO TABS: 50.0000 mg | ORAL_TABLET | Freq: Two times a day (BID) | ORAL | 0 refills | Status: AC | PRN

## 2019-04-30 NOTE — Assessment & Plan Note (Signed)
Possible subluxation.  Possible fracture but it would be very small.  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  We discussed mild bracing, tramadol for breakthrough pain, icing regimen and topical anti-inflammatories that are over-the-counter.  We discussed the importance of deep breathing.  Follow-up again 2 weeks if not completely resolved.

## 2019-04-30 NOTE — Patient Instructions (Addendum)
Good to see you See me again in 2 weeks just in case Tramadol at night as needed Ice every 6 hrs for the next 6 days 10 deep breaths every hour Good to exercise on Monday if you feel better

## 2019-04-30 NOTE — Progress Notes (Signed)
Tawana ScaleZach Tyeshia [name] D.O. [facility] [address] [place], [place] [address] Phone: [phone] Subjective:   I Bernard [name] am serving as a Neurosurgeonscribe for Dr. Antoine PrimasZachary Brylea [name].   CC: Left rib pain  [name]:Subjective  Bernard [name] is a 33 y.o. male coming in with complaint of rib pain. States he was doing martial arts when he caught someone's arm underneath his elbow. Deep breathing are painful.   Onset- Wednesday   Character- sharp Aggravating factors- deep breathing, positional  Reliving factors-  Therapies tried- acetaminophen, ice  Severity-8 out of 10     Past Medical History:  Diagnosis Date  . GERD (gastroesophageal reflux disease)   . Hemorrhoid   . Sebaceous cyst    Past Surgical History:  Procedure Laterality Date  . ESOPHAGOGASTRODUODENOSCOPY N/A 05/18/2015   Procedure: ESOPHAGOGASTRODUODENOSCOPY (EGD);  Surgeon: Louis Meckelobert D Kaplan, MD;  Location: [facility];  Service: Endoscopy;  Laterality: N/A;   Social History   Socioeconomic History  . Marital status: Married    Spouse name: Not on file  . Number of children: 0  . Years of education: Not on file  . Highest education level: Not on file  Occupational History  . Occupation: Technical sales engineeraramedic    [employer] [employer]  Social Needs  . Financial resource strain: Not on file  . Food insecurity    Worry: Not on file    Inability: Not on file  . Transportation needs    Medical: Not on file    Non-medical: Not on file  Tobacco Use  . Smoking status: Former Smoker    Packs/day: 1.00    Years: 10.00    Pack years: 10.00    Types: E-cigarettes, Cigarettes    Quit date: 11/24/2012    Years since quitting: 6.4  . Smokeless tobacco: Never Used  . Tobacco comment: quit e cigarette 2016  Substance and Sexual Activity  . Alcohol use: Yes    Alcohol/week: 0.0 standard drinks    Comment: occasional  . Drug use: No  . Sexual activity: Yes    Partners: Female  Lifestyle  . Physical activity    Days per week:  Not on file    Minutes per session: Not on file  . Stress: Not on file  Relationships  . Social Musicianconnections    Talks on phone: Not on file    Gets together: Not on file    Attends religious service: Not on file    Active member of club or organization: Not on file    Attends meetings of clubs or organizations: Not on file    Relationship status: Not on file  Other Topics Concern  . Not on file  Social History Narrative   [facility]   Lives with roommate/fiancee   Dr. Tora PerchesJeff Katz's stepson   Allergies  Allergen Reactions  . Mold Extract [Trichophyton]     Sneezing, runny nose.    Family History  Problem Relation Age of Onset  . Breast cancer Maternal Grandmother   . Heart disease Father        valve replacement  . Hypertension Father   . GER disease Father   . Colon cancer Neg Hx       Current Outpatient Medications (Respiratory):  .  cetirizine (ZYRTEC) 10 MG tablet, Take 10 mg by mouth every morning.   Current Outpatient Medications (Analgesics):  .  traMADol (ULTRAM) 50 MG tablet, Take 1 tablet (50 mg total) by mouth every 12 (  twelve) hours as needed for up to 5 days.   Current Outpatient Medications (Other):  .  dexlansoprazole (DEXILANT) 60 MG capsule, Take 1 capsule (60 mg total) by mouth daily. .  famotidine (PEPCID) 20 MG tablet, Take 1 tablet (20 mg total) by mouth 2 (two) times daily. .  hydrocortisone (ANUSOL-HC) 25 MG suppository, Place 1 suppository (25 mg total) rectally 2 (two) times daily. .  Multiple Vitamin (MULTIVITAMIN WITH MINERALS) TABS tablet, Take 1 tablet by mouth daily.    Past medical history, social, surgical and family history all reviewed in electronic medical record.  No pertanent information unless stated regarding to the chief complaint.   Review of Systems:  No headache, visual changes, nausea, vomiting, diarrhea, constipation, dizziness, abdominal pain, skin rash, fevers, chills, night sweats, weight loss, swollen  lymph nodes, body aches, joint swelling,  chest pain, shortness of breath, mood changes.  Positive muscle aches  Objective  Blood pressure 126/80, pulse 77, height 5\' 9"  (1.753 m), weight 165 lb (74.8 kg), SpO2 95 %.    General: No apparent distress alert and oriented x3 mood and affect normal, dressed appropriately.  HEENT: Pupils equal, extraocular movements intact  Respiratory: Patient's speak in full sentences and does not appear short of breath  Cardiovascular: No lower extremity edema, non tender, no erythema  Skin: Warm dry intact with no signs of infection or rash on extremities or on axial skeleton.  Abdomen: Soft nontender  Neuro: Cranial nerves II through XII are intact, neurovascularly intact in all extremities with 2+ DTRs and 2+ pulses.  Lymph: No lymphadenopathy of posterior or anterior cervical chain or axillae bilaterally.  Gait normal with good balance and coordination.  MSK:  Non tender with full range of motion and good stability and symmetric strength and tone of shoulders, elbows, wrist, hip, knee and ankles bilaterally.  Patient's wounds on the left side show some mild bruising noted.  Severely tender over the anterior lateral aspect of the fifth and sixth ribs.  No crepitus noted.  Limited musculoskeletal ultrasound was performed and interpreted by Lyndal Pulley  Limited ultrasound of patient's fifth and sixth rib on the left side did not show any true cortical defect but does have some mild hypoechoic changes in the area.  Possibly more of a bruising or contusion.  Possible subluxation noted with patient not having perfect alignment.   Impression and Recommendations:     This case required medical decision making of moderate complexity. The above documentation has been reviewed and is accurate and complete Lyndal Pulley, DO       Note: This dictation was prepared with Dragon dictation along with smaller phrase technology. Any transcriptional errors that  result from this process are unintentional.

## 2019-05-13 NOTE — Progress Notes (Signed)
Bernard ScaleZach [name] D.O. [facility] [address] [place], [place] [address] Phone: [phone] Subjective:   I Bernard NighKana [name] am serving as a Neurosurgeonscribe for Dr. Antoine PrimasZachary [name].    CC: Rib pain follow-up  Bernard [name]:Subjective   04/30/2019 Possible subluxation.  Possible fracture but it would be very small.  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  We discussed mild bracing, tramadol for breakthrough pain, icing regimen and topical anti-inflammatories that are over-the-counter.  We discussed the importance of deep breathing.  Follow-up again 2 weeks if not completely resolved.  05/14/2019 Bernard HughsShaman R [name] is a 33 y.o. male coming in with complaint of left side pain. States he is doing ok today.  Patient states he is feeling 50% better at this time.  Not having as much significant pain.  Continuing the vitamin D.  Patient states that certain activities such as push-ups does cause some increasing discomfort and pain no.  Has not needed the tramadol at all.       Past Medical History:  Diagnosis Date  . GERD (gastroesophageal reflux disease)   . Hemorrhoid   . Sebaceous cyst    Past Surgical History:  Procedure Laterality Date  . ESOPHAGOGASTRODUODENOSCOPY N/A 05/18/2015   Procedure: ESOPHAGOGASTRODUODENOSCOPY (EGD);  Surgeon: Louis Meckelobert D Kaplan, MD;  [facility];  Service: Endoscopy;  Laterality: N/A;   Social History   Socioeconomic History  . Marital status: Married    Spouse name: Not on file  . Number of children: 0  . Years of education: Not on file  . Highest education level: Not on file  Occupational History  . Occupation: Technical sales engineeraramedic    [employer] [employer]  Social Needs  . Financial resource strain: Not on file  . Food insecurity    Worry: Not on file    Inability: Not on file  . Transportation needs    Medical: Not on file    Non-medical: Not on file  Tobacco Use  . Smoking status: Former Smoker    Packs/day: 1.00   Years: 10.00    Pack years: 10.00    Types: E-cigarettes, Cigarettes    Quit date: 11/24/2012    Years since quitting: 6.4  . Smokeless tobacco: Never Used  . Tobacco comment: quit e cigarette 2016  Substance and Sexual Activity  . Alcohol use: Yes    Alcohol/week: 0.0 standard drinks    Comment: occasional  . Drug use: No  . Sexual activity: Yes    Partners: Female  Lifestyle  . Physical activity    Days per week: Not on file    Minutes per session: Not on file  . Stress: Not on file  Relationships  . Social Musicianconnections    Talks on phone: Not on file    Gets together: Not on file    Attends religious service: Not on file    Active member of club or organization: Not on file    Attends meetings of clubs or organizations: Not on file    Relationship status: Not on file  Other Topics Concern  . Not on file  Social History Narrative   [facility]   Lives with roommate/fiancee   Dr. Tora PerchesJeff [name]'s stepson   Allergies  Allergen Reactions  . Mold Extract [Trichophyton]     Sneezing, runny nose.    Family History  Problem Relation Age of Onset  . Breast cancer Maternal Grandmother   . Heart disease Father  valve replacement  . Hypertension Father   . GER disease Father   . Colon cancer Neg Hx       Current Outpatient Medications (Respiratory):  .  cetirizine (ZYRTEC) 10 MG tablet, Take 10 mg by mouth every morning.     Current Outpatient Medications (Other):  .  dexlansoprazole (DEXILANT) 60 MG capsule, Take 1 capsule (60 mg total) by mouth daily. .  famotidine (PEPCID) 20 MG tablet, Take 1 tablet (20 mg total) by mouth 2 (two) times daily. .  hydrocortisone (ANUSOL-HC) 25 MG suppository, Place 1 suppository (25 mg total) rectally 2 (two) times daily. .  Multiple Vitamin (MULTIVITAMIN WITH MINERALS) TABS tablet, Take 1 tablet by mouth daily.    Past medical history, social, surgical and family history all reviewed in electronic medical record.   No pertanent information unless stated regarding to the chief complaint.   Review of Systems:  No headache, visual changes, nausea, vomiting, diarrhea, constipation, dizziness, abdominal pain, skin rash, fevers, chills, night sweats, weight loss, swollen lymph nodes, body aches, joint swelling, , chest pain, shortness of breath, mood changes.  Positive muscle aches  Objective  Blood pressure 130/70, pulse 68, height 5\' 9"  (1.753 m), weight 168 lb (76.2 kg), SpO2 98 %.    General: No apparent distress alert and oriented x3 mood and affect normal, dressed appropriately.  HEENT: Pupils equal, extraocular movements intact  Respiratory: Patient's speak in full sentences and does not appear short of breath  Cardiovascular: No lower extremity edema, non tender, no erythema  Skin: Warm dry intact with no signs of infection or rash on extremities or on axial skeleton.  Abdomen: Soft nontender patient does have a fairly long xiphoid process. Neuro: Cranial nerves II through XII are intact, neurovascularly intact in all extremities with 2+ DTRs and 2+ pulses.  Lymph: No lymphadenopathy of posterior or anterior cervical chain or axillae bilaterally.  Gait normal with good balance and coordination.  MSK:  Non tender with full range of motion and good stability and symmetric strength and tone of shoulders, elbows, wrist, hip, knee and ankles bilaterally.  Hypermobility noted    Impression and Recommendations:     . The above documentation has been reviewed and is accurate and complete Lyndal Pulley, DO       Note: This dictation was prepared with Dragon dictation along with smaller phrase technology. Any transcriptional errors that result from this process are unintentional.

## 2019-05-14 ENCOUNTER — Encounter: Payer: Self-pay | Admitting: Family Medicine

## 2019-05-14 ENCOUNTER — Other Ambulatory Visit: Payer: Self-pay

## 2019-05-14 ENCOUNTER — Ambulatory Visit: Payer: 59 | Admitting: Family Medicine

## 2019-05-14 DIAGNOSIS — R0781 Pleurodynia: Secondary | ICD-10-CM

## 2019-05-14 NOTE — Assessment & Plan Note (Signed)
Patient is doing better but still with some difficulty.  Patient knows about 50% improved.  Increasing activity slowly.  We discussed with patient Would like him to avoid certain movements such as push-ups for another month.  We discussed high impact exercises could be difficult as well.  Continue the vitamin supplementations.  Follow-up with me again in 5 to 6 weeks to fully released from boxing

## 2019-05-14 NOTE — Patient Instructions (Signed)
Avoid movements of hands outside of prephrial vision No pushups until labor day See me again in 5 weeks

## 2019-06-05 ENCOUNTER — Other Ambulatory Visit: Payer: Self-pay | Admitting: Internal Medicine

## 2019-06-05 ENCOUNTER — Telehealth: Payer: Self-pay | Admitting: Internal Medicine

## 2019-06-05 DIAGNOSIS — K2100 Gastro-esophageal reflux disease with esophagitis, without bleeding: Secondary | ICD-10-CM

## 2019-06-05 MED ORDER — DEXILANT 60 MG PO CPDR: DELAYED_RELEASE_CAPSULE | ORAL | 1 refills | Status: DC

## 2019-06-05 NOTE — Telephone Encounter (Signed)
Copied from [place] [phone]. Topic: Quick Communication - Rx Refill/Question >> Jun 05, 2019  8:33 AM Erick Blinks wrote: Medication: dexlansoprazole (DEXILANT) 60 MG capsule  Has the patient contacted their pharmacy? Yes.   (Agent: If no, request that the patient contact the pharmacy for the refill.) (Agent: If yes, when and what did the pharmacy advise?)  Preferred Pharmacy (with phone number or street name): [place] [place] [facility] [address] [place] [address] [place] [address] Phone: [phone] Fax: [phone]    Agent: Please be advised that RX refills may take up to 3 business days. We ask that you follow-up with your pharmacy.

## 2019-06-19 ENCOUNTER — Encounter: Payer: Self-pay | Admitting: Family Medicine

## 2019-06-19 ENCOUNTER — Other Ambulatory Visit: Payer: Self-pay

## 2019-06-19 ENCOUNTER — Ambulatory Visit (INDEPENDENT_AMBULATORY_CARE_PROVIDER_SITE_OTHER): Payer: 59 | Admitting: Family Medicine

## 2019-06-19 DIAGNOSIS — R0781 Pleurodynia: Secondary | ICD-10-CM | POA: Diagnosis not present

## 2019-06-19 NOTE — Patient Instructions (Signed)
[phone] 

## 2019-06-19 NOTE — Assessment & Plan Note (Signed)
Patient is doing very well at this time.  No significant pain backslash patient will follow-up with me as needed.

## 2019-06-19 NOTE — Progress Notes (Signed)
Tawana ScaleZach Nadav [name] D.O. [facility] [address] [place], [place] [address] Phone: [phone] Subjective:   Bernard [name], Bernard [name], am serving as a scribe for Dr. Antoine PrimasZachary Nimesh [name].   CC: Rib pain follow-up  QIO:[id number]:Subjective  Bernard HughsShaman R [name] is a 33 y.o. male coming in with complaint of rib pain. Has not been having any pain since last visit. Continues OTC Vitamin D.  Patient's was found to have more of a stress reaction.  Patient states that he is now 100% better at the time.      Past Medical History:  Diagnosis Date  . GERD (gastroesophageal reflux disease)   . Hemorrhoid   . Sebaceous cyst    Past Surgical History:  Procedure Laterality Date  . ESOPHAGOGASTRODUODENOSCOPY N/A 05/18/2015   Procedure: ESOPHAGOGASTRODUODENOSCOPY (EGD);  Surgeon: Louis Meckelobert D Kaplan, MD;  [facility];  Service: Endoscopy;  Laterality: N/A;   Social History   Socioeconomic History  . Marital status: Married    Spouse name: Not on file  . Number of children: 0  . Years of education: Not on file  . Highest education level: Not on file  Occupational History  . Occupation: Technical sales engineeraramedic    [employer] [employer]  Social Needs  . Financial resource strain: Not on file  . Food insecurity    Worry: Not on file    Inability: Not on file  . Transportation needs    Medical: Not on file    Non-medical: Not on file  Tobacco Use  . Smoking status: Former Smoker    Packs/day: 1.00    Years: 10.00    Pack years: 10.00    Types: E-cigarettes, Cigarettes    Quit date: 11/24/2012    Years since quitting: 6.5  . Smokeless tobacco: Never Used  . Tobacco comment: quit e cigarette 2016  Substance and Sexual Activity  . Alcohol use: Yes    Alcohol/week: 0.0 standard drinks    Comment: occasional  . Drug use: No  . Sexual activity: Yes    Partners: Female  Lifestyle  . Physical activity    Days per week: Not on file    Minutes per session: Not on file  . Stress: Not on file  Relationships  .  Social Musicianconnections    Talks on phone: Not on file    Gets together: Not on file    Attends religious service: Not on file    Active member of club or organization: Not on file    Attends meetings of clubs or organizations: Not on file    Relationship status: Not on file  Other Topics Concern  . Not on file  Social History Narrative   [facility]   Lives with roommate/fiancee   Dr. Tora PerchesJeff [name]'s stepson   Allergies  Allergen Reactions  . Mold Extract [Trichophyton]     Sneezing, runny nose.    Family History  Problem Relation Age of Onset  . Breast cancer Maternal Grandmother   . Heart disease Father        valve replacement  . Hypertension Father   . GER disease Father   . Colon cancer Neg Hx       Current Outpatient Medications (Respiratory):  .  cetirizine (ZYRTEC) 10 MG tablet, Take 10 mg by mouth every morning.     Current Outpatient Medications (Other):  .  dexlansoprazole (DEXILANT) 60 MG capsule, TAKE 1 CAPSULE BY MOUTH  DAILY BEFORE BREAKFAST .  famotidine (PEPCID) 20 MG  tablet, Take 1 tablet (20 mg total) by mouth 2 (two) times daily. .  hydrocortisone (ANUSOL-HC) 25 MG suppository, Place 1 suppository (25 mg total) rectally 2 (two) times daily. .  Multiple Vitamin (MULTIVITAMIN WITH MINERALS) TABS tablet, Take 1 tablet by mouth daily.    Past medical history, social, surgical and family history all reviewed in electronic medical record.  No pertanent information unless stated regarding to the chief complaint.   Review of Systems:  No headache, visual changes, nausea, vomiting, diarrhea, constipation, dizziness, abdominal pain, skin rash, fevers, chills, night sweats, weight loss, swollen lymph nodes, body aches, joint swelling,  chest pain, shortness of breath, mood changes.  Positive muscle aches  Objective  Blood pressure 104/64, pulse (!) 55, height 5\' 9"  (1.753 m), SpO2 99 %.    General: No apparent distress alert and oriented x3 mood and  affect normal, dressed appropriately.  HEENT: Pupils equal, extraocular movements intact  Respiratory: Patient's speak in full sentences and does not appear short of breath  Cardiovascular: No lower extremity edema, non tender, no erythema  Skin: Warm dry intact with no signs of infection or rash on extremities or on axial skeleton.  Abdomen: Soft nontender  Neuro: Cranial nerves II through XII are intact, neurovascularly intact in all extremities with 2+ DTRs and 2+ pulses.  Lymph: No lymphadenopathy of posterior or anterior cervical chain or axillae bilaterally.  Gait normal with good balance and coordination.  MSK:  Non tender with full range of motion and good stability and symmetric strength and tone of shoulders, elbows, wrist, hip, knee and ankles bilaterally.     Impression and Recommendations:     This case required medical decision making of moderate complexity. The above documentation has been reviewed and is accurate and complete Lyndal Pulley, DO       Note: This dictation was prepared with Dragon dictation along with smaller phrase technology. Any transcriptional errors that result from this process are unintentional.

## 2019-07-17 ENCOUNTER — Encounter: Payer: Self-pay | Admitting: Family Medicine

## 2019-07-23 ENCOUNTER — Telehealth: Payer: Self-pay | Admitting: Internal Medicine

## 2019-07-23 NOTE — Telephone Encounter (Signed)
Called patient back and offered him an appt 07/25/19 with an app., said he couldn't do that day so scheduled him 07/31/19 with Tye Savoy NP

## 2019-07-23 NOTE — Telephone Encounter (Signed)
This type of problem really needs an in office evaluation and exam - I would like him to see an APP - hopefully this week so they can evaluate his problem (hemorrhoids vs fissure vs something else)  He can keep the appt w/ me for f/u but I recommend we see him in the office as above  May purchase 5% lidocaine OTC and try that (Recticare) as neede til we see him

## 2019-07-23 NOTE — Telephone Encounter (Signed)
Called patient back. States he has tried sitz baths BID- which gives a little temporary comfort. Has tried prep H in the past and says that didn't help. He agreed to get some Tucks and see if that helps. But he is wanting to know if he could also get a prescription for something else for his hemorroids

## 2019-07-23 NOTE — Telephone Encounter (Signed)
Pt has appt scheduled on 11/10 with Dr. Carlean Purl but wants to know if he could prescribe something in the meantime for hemorrhoids.

## 2019-07-26 ENCOUNTER — Other Ambulatory Visit: Payer: Self-pay | Admitting: Internal Medicine

## 2019-07-31 ENCOUNTER — Ambulatory Visit: Payer: 59 | Admitting: Nurse Practitioner

## 2019-07-31 ENCOUNTER — Encounter: Payer: Self-pay | Admitting: Nurse Practitioner

## 2019-07-31 VITALS — BP 110/78 | HR 64 | Temp 98.3°F | Ht 69.0 in | Wt 166.8 lb

## 2019-07-31 DIAGNOSIS — K644 Residual hemorrhoidal skin tags: Secondary | ICD-10-CM

## 2019-07-31 DIAGNOSIS — K219 Gastro-esophageal reflux disease without esophagitis: Secondary | ICD-10-CM | POA: Diagnosis not present

## 2019-07-31 NOTE — Patient Instructions (Signed)
You have been scheduled for an endoscopy. Please follow written instructions given to you at your visit today. If you use inhalers (even only as needed), please bring them with you on the day of your procedure.   If you are age 34 or older, your body mass index should be between 23-30. Your Body mass index is 24.63 kg/m. If this is out of the aforementioned range listed, please consider follow up with your Primary Care Provider.  If you are age 76 or younger, your body mass index should be between 19-25. Your Body mass index is 24.63 kg/m. If this is out of the aformentioned range listed, please consider follow up with your Primary Care Provider.   Keep Novembers appointment  I appreciate the  opportunity to care for you  Thank You   [place]

## 2019-07-31 NOTE — Progress Notes (Signed)
Chief Complaint:    GERD and hemorrhoids   IMPRESSION and PLAN:    48.  33 year old male with longstanding GERD.  Having breakthrough heartburn on daily Dexilant, twice daily famotidine in addition to several TUMS throughout the day.  -Last EGD was normal but that was back in July 2016.  For refractory GERD symptoms patient will be scheduled for EGD. The risks and benefits of EGD were discussed and the patient agrees to proceed.   2.  Chronic intermittent hemorrhoid flares.  Called last week with rectal pain from what he describes as thrombosed external hemorrhoid.  His pain and hemorrhoid swelling have resolved over the last several days after using Tucks.  He declined rectal exam today.  -Without exam I cannot be sure if hemorrhoids are external/internal or may be both.  With the pain it would suggest external hemorrhoid but thrombosed internal hemorrhoids can cause pain as well.  Happy to see him back at any time for evaluation of hemorrhoids   HPI:     Patient is a 33 year old male known to Bernard [name] for history of GERD.  He has not been seen since summer 2018.  Patient recently called the office with complaints of hemorrhoids/rectal pain and was worked in today for evaluation. Since making this appointment patient has been using Tucks. He says the pain has resolved and hemorrhoidal swelling is almost gone as well.  He does not typically bleed with hemorrhoid flares but occasionally has a little bit of blood on tissue after vigorous wiping. At one time he was struggling with constipation but since starting daily fiber and stopping [place] yogurt his BMs are normal. Not typically bothered with hemorrhoids but in since mid July this has been his second flare.   Bernard [name] is mainly here to discuss ongoing heartburn.  He was diagnosed with GERD in his teens.  He has required escalation of therapy over the years.  Patient said he initially doubled his PPI then was started on Dexilant.   Famotidine twice daily was added to the ducts a lot and now he is having to take Tums 4-6 times a day.  He avoids citrus and greasy foods, only drinks one caffeine containing beverage a day and tries to go to bed on an empty stomach.  Despite these interventions he still gets heartburn several times a day.  He has no regurgitation.  No dysphagia.  Review of systems:     No chest pain, no SOB, no fevers, no urinary sx   Past Medical History:  Diagnosis Date  . GERD (gastroesophageal reflux disease)   . Hemorrhoid   . Sebaceous cyst     Patient's surgical history, family medical history, social history, medications and allergies were all reviewed in Epic    Current Outpatient Medications  Medication Sig Dispense Refill  . cetirizine (ZYRTEC) 10 MG tablet Take 10 mg by mouth every morning.     . Cholecalciferol (VITAMIN D) 50 MCG (2000 UT) CAPS Take 1 capsule by mouth daily.    Marland Kitchen dexlansoprazole (DEXILANT) 60 MG capsule TAKE 1 CAPSULE BY MOUTH  DAILY BEFORE BREAKFAST 90 capsule 1  . famotidine (PEPCID) 20 MG tablet TAKE 1 TABLET(20 MG) BY MOUTH TWICE DAILY 180 tablet 0  . Multiple Vitamin (MULTIVITAMIN WITH MINERALS) TABS tablet Take 1 tablet by mouth daily.     No current facility-administered medications for this visit.     Physical Exam:     BP 110/78   Pulse 64  Temp 98.3 F (36.8 C)   Ht 5\' 9"  (1.753 m)   Wt 166 lb 12.8 oz (75.7 kg)   BMI 24.63 kg/m   GENERAL:  Pleasant male in NAD PSYCH: : Cooperative, normal affect EENT:  conjunctiva pink, mucous membranes moist, neck supple without masses CARDIAC:  RRR, no murmur heard, no peripheral edema PULM: Normal respiratory effort, lungs CTA bilaterally, no wheezing ABDOMEN:  Nondistended, soft, nontender. No obvious masses, no hepatomegaly,  normal bowel sounds SKIN:  turgor, no lesions seen Musculoskeletal:  Normal muscle tone, normal strength NEURO: Alert and oriented x 3, no focal neurologic deficits   ,  NP 07/31/2019, 2:10 PM

## 2019-08-01 ENCOUNTER — Encounter: Payer: Self-pay | Admitting: Nurse Practitioner

## 2019-08-12 NOTE — Progress Notes (Signed)
[facility] [place], [place] [address] Phone: [phone] Subjective:   Bernard [name], am serving as a scribe for Dr. Hulan Saas.   CC: Bilateral knee pain  ZJQ:[id number]  Bernard [name] is a 33 y.o. male coming in with complaint of bilateral knee pain. Last seen for patellofemoral in 2018. Patient states in September 2020 patient was running for 60 minutes and he noticed that he had pain with stairs and walking in the sand. Pain occurring over the patella. Wears a knee sleeve that is providing relief. Has not been running. Has been trying to do more strength training.  Patient states that he is now aware of it but is not and is in severe amount of pain.  Patient is trying to make sure he is not doing any damage to his knees.  Wants to continue to be active as possible.      Past Medical History:  Diagnosis Date  . GERD (gastroesophageal reflux disease)   . Hemorrhoid   . Sebaceous cyst    Past Surgical History:  Procedure Laterality Date  . ESOPHAGOGASTRODUODENOSCOPY N/A 05/18/2015   Procedure: ESOPHAGOGASTRODUODENOSCOPY (EGD);  Surgeon: Inda Castle, MD;  Location: [facility];  Service: Endoscopy;  Laterality: N/A;   Social History   Socioeconomic History  . Marital status: Married    Spouse name: Not on file  . Number of children: 0  . Years of education: Not on file  . Highest education level: Not on file  Occupational History  . Occupation: Customer service manager: [employer]  . Financial resource strain: Not on file  . Food insecurity    Worry: Not on file    Inability: Not on file  . Transportation needs    Medical: Not on file    Non-medical: Not on file  Tobacco Use  . Smoking status: Former Smoker    Packs/day: 1.00    Years: 10.00    Pack years: 10.00    Types: E-cigarettes, Cigarettes    Quit date: 11/24/2012    Years since quitting: 6.7  . Smokeless tobacco: Never Used  . Tobacco  comment: quit e cigarette 2016  Substance and Sexual Activity  . Alcohol use: Yes    Alcohol/week: 0.0 standard drinks    Comment: occasional  . Drug use: [name]  . Sexual activity: Yes    Partners: Female  Lifestyle  . Physical activity    Days per week: Not on file    Minutes per session: Not on file  . Stress: Not on file  Relationships  . Social Herbalist on phone: Not on file    Gets together: Not on file    Attends religious service: Not on file    Active member of club or organization: Not on file    Attends meetings of clubs or organizations: Not on file    Relationship status: Not on file  Other Topics Concern  . Not on file  Social History Narrative   [facility] - [facility]   Lives with roommate/fiancee   Dr. Estill Bamberg stepson   Allergies  Allergen Reactions  . Mold Extract [Trichophyton]     Sneezing, runny nose.    Family History  Problem Relation Age of Onset  . Breast cancer Maternal Grandmother   . Heart disease Father        valve replacement  . Hypertension Father   .  GER disease Father   . Colon cancer Neg Hx       Current Outpatient Medications (Respiratory):  .  cetirizine (ZYRTEC) 10 MG tablet, Take 10 mg by mouth every morning.     Current Outpatient Medications (Other):  Marland Kitchen  Cholecalciferol (VITAMIN D) 50 MCG (2000 UT) CAPS, Take 1 capsule by mouth daily. Marland Kitchen  dexlansoprazole (DEXILANT) 60 MG capsule, TAKE 1 CAPSULE BY MOUTH  DAILY BEFORE BREAKFAST .  famotidine (PEPCID) 20 MG tablet, TAKE 1 TABLET(20 MG) BY MOUTH TWICE DAILY .  Multiple Vitamin (MULTIVITAMIN WITH MINERALS) TABS tablet, Take 1 tablet by mouth daily.    Past medical history, social, surgical and family history all reviewed in electronic medical record.  [name] pertanent information unless stated regarding to the chief complaint.   Review of Systems:  [name] headache, visual changes, nausea, vomiting, diarrhea, constipation, dizziness, abdominal pain, skin rash,  fevers, chills, night sweats, weight loss, swollen lymph nodes, body aches, joint swelling, muscle aches, chest pain, shortness of breath, mood changes.   Objective  There were [name] vitals taken for this visit. Systems examined below as of    General: [name] apparent distress alert and oriented x3 mood and affect normal, dressed appropriately.  HEENT: Pupils equal, extraocular movements intact  Respiratory: Patient's speak in full sentences and does not appear short of breath  Cardiovascular: [name] lower extremity edema, non tender, [name] erythema  Skin: Warm dry intact with [name] signs of infection or rash on extremities or on axial skeleton.  Abdomen: Soft nontender  Neuro: Cranial nerves II through XII are intact, neurovascularly intact in all extremities with 2+ DTRs and 2+ pulses.  Lymph: [name] lymphadenopathy of posterior or anterior cervical chain or axillae bilaterally.  Gait normal with good balance and coordination.  MSK:  Non tender with full range of motion and good stability and symmetric strength and tone of shoulders, elbows, wrist, hip, and ankles bilaterally.  Hypermobility noted Knee: Bilateral Normal patella alta noted Palpation mild to moderate tenderness in the patellofemoral right greater than left ROM full in flexion and extension and lower leg rotation. Ligaments with solid consistent endpoints including ACL, PCL, LCL, MCL. Negative Mcmurray's, Apley's, and Thessalonian tests. painful patellar compression. Patellar glide with mild crepitus. Patellar and quadriceps tendons unremarkable. Hamstring and quadriceps strength is normal.  MSK US performed of: Bilateral This study was ordered, performed, and interpreted by Terrilee Files D.O.  Knee: All structures visualized. Anteromedial, anterolateral, posteromedial, and posterolateral menisci unremarkable without tearing, fraying, effusion, or displacement. Patellar Tendon unremarkable on long and transverse views without effusion. [name]  abnormality of prepatellar bursa. LCL and MCL unremarkable on long and transverse views. [name] abnormality of origin of medial or lateral head of the gastrocnemius. Very mild narrowing of the patellofemoral joint on the right side  IMPRESSION:  NORMAL ULTRASONOGRAPHIC EXAMINATION OF THE KNEE.   Impression and Recommendations:     This case required medical decision making of moderate complexity. The above documentation has been reviewed and is accurate and complete Judi Saa, DO       Note: This dictation was prepared with Dragon dictation along with smaller phrase technology. Any transcriptional errors that result from this process are unintentional.

## 2019-08-13 ENCOUNTER — Ambulatory Visit: Payer: Self-pay

## 2019-08-13 ENCOUNTER — Ambulatory Visit (INDEPENDENT_AMBULATORY_CARE_PROVIDER_SITE_OTHER): Payer: 59 | Admitting: Family Medicine

## 2019-08-13 ENCOUNTER — Other Ambulatory Visit (INDEPENDENT_AMBULATORY_CARE_PROVIDER_SITE_OTHER): Payer: 59

## 2019-08-13 ENCOUNTER — Encounter: Payer: Self-pay | Admitting: Family Medicine

## 2019-08-13 ENCOUNTER — Other Ambulatory Visit: Payer: Self-pay

## 2019-08-13 VITALS — BP 110/76 | HR 68 | Ht 69.0 in | Wt 167.0 lb

## 2019-08-13 DIAGNOSIS — M2550 Pain in unspecified joint: Secondary | ICD-10-CM

## 2019-08-13 DIAGNOSIS — M25561 Pain in right knee: Secondary | ICD-10-CM

## 2019-08-13 DIAGNOSIS — G8929 Other chronic pain: Secondary | ICD-10-CM | POA: Diagnosis not present

## 2019-08-13 DIAGNOSIS — M25562 Pain in left knee: Secondary | ICD-10-CM

## 2019-08-13 LAB — FERRITIN: Ferritin: 31 ng/mL (ref 22.0–322.0)

## 2019-08-13 LAB — CBC WITH DIFFERENTIAL/PLATELET
Basophils Absolute: 0 10*3/uL (ref 0.0–0.1)
Basophils Relative: 0.6 % (ref 0.0–3.0)
Eosinophils Absolute: 0.1 10*3/uL (ref 0.0–0.7)
Eosinophils Relative: 2.2 % (ref 0.0–5.0)
HCT: 47.2 % (ref 39.0–52.0)
Hemoglobin: 15.7 g/dL (ref 13.0–17.0)
Lymphocytes Relative: 24 % (ref 12.0–46.0)
Lymphs Abs: 1.3 10*3/uL (ref 0.7–4.0)
MCHC: 33.3 g/dL (ref 30.0–36.0)
MCV: 87.5 fl (ref 78.0–100.0)
Monocytes Absolute: 0.5 10*3/uL (ref 0.1–1.0)
Monocytes Relative: 8.5 % (ref 3.0–12.0)
Neutro Abs: 3.5 10*3/uL (ref 1.4–7.7)
Neutrophils Relative %: 64.7 % (ref 43.0–77.0)
Platelets: 275 10*3/uL (ref 150.0–400.0)
RBC: 5.39 Mil/uL (ref 4.22–5.81)
RDW: 13.2 % (ref 11.5–15.5)
WBC: 5.3 10*3/uL (ref 4.0–10.5)

## 2019-08-13 LAB — COMPREHENSIVE METABOLIC PANEL
ALT: 29 U/L (ref 0–53)
AST: 27 U/L (ref 0–37)
Albumin: 4.7 g/dL (ref 3.5–5.2)
Alkaline Phosphatase: 49 U/L (ref 39–117)
BUN: 16 mg/dL (ref 6–23)
CO2: 30 mEq/L (ref 19–32)
Calcium: 9.7 mg/dL (ref 8.4–10.5)
Chloride: 101 mEq/L (ref 96–112)
Creatinine, Ser: 0.98 mg/dL (ref 0.40–1.50)
GFR: 87.46 mL/min (ref >60.00)
Glucose, Bld: 97 mg/dL (ref 70–99)
Potassium: 3.7 mEq/L (ref 3.5–5.1)
Sodium: 139 mEq/L (ref 135–145)
Total Bilirubin: 0.6 mg/dL (ref 0.2–1.2)
Total Protein: 7 g/dL (ref 6.0–8.3)

## 2019-08-13 LAB — C-REACTIVE PROTEIN: CRP: <1 mg/dL (ref 0.5–20.0)

## 2019-08-13 LAB — VITAMIN D 25 HYDROXY (VIT D DEFICIENCY, FRACTURES): VITD: 57.99 ng/mL (ref 30.00–100.00)

## 2019-08-13 LAB — TESTOSTERONE: Testosterone: 320.62 ng/dL (ref 300.00–890.00)

## 2019-08-13 LAB — SEDIMENTATION RATE: Sed Rate: 4 mm/hr (ref 0–15)

## 2019-08-13 LAB — TSH: TSH: 1.27 u[IU]/mL (ref 0.35–4.50)

## 2019-08-13 LAB — IBC PANEL
Iron: 74 ug/dL (ref 42–165)
Saturation Ratios: 22.8 % (ref 20.0–50.0)
Transferrin: 232 mg/dL (ref 212.0–360.0)

## 2019-08-13 LAB — URIC ACID: Uric Acid, Serum: 5.7 mg/dL (ref 4.0–7.8)

## 2019-08-13 LAB — CORTISOL: Cortisol, Plasma: 8.8 ug/dL

## 2019-08-13 NOTE — Patient Instructions (Addendum)
Lab downstairs Patellar straps with working out Body weight only for 2 weeks increase slowly after that Continue home exercises See me in 6 weeks

## 2019-08-13 NOTE — Assessment & Plan Note (Signed)
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  Declined formal physical therapy.  Declined injection.  Laboratory work-up to further evaluate for any other deficiencies that could be contributing to patient having increasing amount of pain out of proportion to what is found on physical exam today.  Follow-up with me again in 6 weeks

## 2019-08-15 LAB — PTH, INTACT AND CALCIUM
Calcium: 9.9 mg/dL (ref 8.6–10.3)
PTH: 18 pg/mL (ref 14–64)

## 2019-08-15 LAB — RHEUMATOID FACTOR: Rheumatoid fact SerPl-aCnc: <14 IU/mL (ref <14)

## 2019-08-15 LAB — CALCIUM, IONIZED: Calcium, Ion: 5.13 mg/dL (ref 4.8–5.6)

## 2019-08-15 LAB — ANA: Anti Nuclear Antibody (ANA): NEGATIVE

## 2019-08-15 LAB — CYCLIC CITRUL PEPTIDE ANTIBODY, IGG: Cyclic Citrullin Peptide Ab: <16 UNITS

## 2019-08-15 LAB — ANGIOTENSIN CONVERTING ENZYME: Angiotensin-Converting Enzyme: 30 U/L (ref 9–67)

## 2019-08-17 ENCOUNTER — Ambulatory Visit (INDEPENDENT_AMBULATORY_CARE_PROVIDER_SITE_OTHER)
Admission: RE | Admit: 2019-08-17 | Discharge: 2019-08-17 | Disposition: A | Discharge status: Home / Self Care | Payer: 59 | Source: Ambulatory Visit

## 2019-08-17 DIAGNOSIS — M791 Myalgia, unspecified site: Secondary | ICD-10-CM | POA: Diagnosis not present

## 2019-08-17 DIAGNOSIS — R509 Fever, unspecified: Secondary | ICD-10-CM | POA: Diagnosis not present

## 2019-08-17 DIAGNOSIS — R6889 Other general symptoms and signs: Secondary | ICD-10-CM

## 2019-08-17 MED ORDER — OSELTAMIVIR PHOSPHATE 75 MG PO CAPS: 75.0000 mg | ORAL_CAPSULE | Freq: Two times a day (BID) | ORAL | 0 refills | Status: AC

## 2019-08-17 NOTE — ED Provider Notes (Signed)
Virtual Visit via Video Note:  Bernard [name]  initiated request for Telemedicine visit with [facility] team. I connected with Bernard [name]  on 08/17/2019 at 12:03 PM  for a synchronized telemedicine visit using a video enabled HIPPA compliant telemedicine application. I verified that I am speaking with Bernard [name]  using two identifiers. Bernard Hall-Potvin, Bernard [name]  was physically located in a [facility]gent care site and Bernard [name] was located at a different location.   The limitations of evaluation and management by telemedicine as well as the availability of in-person appointments were discussed. Patient was informed that he  may incur a bill ( including co-pay) for this virtual visit encounter. Bernard [name]  expressed understanding and gave verbal consent to proceed with virtual visit.     History of Present Illness:Bernard [name]  is a 33 y.o. male presents with sudden onset of myalgias, chills, fever (T-max 100.5 F) since last night.  Patient states that fever has been alleviated by 1000 mg Tylenol.  Patient also endorsing intermittent constipation, diarrhea, increased flatus last week (patient does have history of GI issues; denies significant change from baseline, abdominal pain, nausea/vomiting).  Patient works as a Radiation protection practitioner, has been around [employer] positive patients with appropriate PPE.  Patient is largely concerned as his wife is [redacted] weeks pregnant: Has been quarantining at home and avoiding close contact since symptom onset.  Wife does not currently have symptoms at this time.    Review of Systems  Constitutional: Positive for chills and fever. Negative for malaise/fatigue.  HENT: Negative for congestion, ear pain and sore throat.   Eyes: Negative for pain and discharge.  Respiratory: Negative for cough and shortness of breath.   Cardiovascular: Negative for chest pain and palpitations.  Gastrointestinal: Negative for abdominal pain, diarrhea and  vomiting.  Musculoskeletal: Positive for myalgias. Negative for joint pain.  Neurological: Negative for sensory change and headaches.    Past Medical History:  Diagnosis Date  . GERD (gastroesophageal reflux disease)   . Hemorrhoid   . Sebaceous cyst     Allergies  Allergen Reactions  . Mold Extract [Trichophyton]     Sneezing, runny nose.         Observations/Objective: 33 year-old male Sitting in no acute distress.  Patient is able to speak in full sentences without coughing, sneezing, wheezing.  Assessment and Plan: 1.  Flulike symptoms Patient has tolerated Tamiflu well in the past, requesting today given consistent symptoms to previous influenza.  No rapid flu testing available at this time at this facility.  Patient to also undergo Covid testing next week (Monday through Wednesday) through [facility] as he is off work until that time, and just developed symptoms yesterday/campus is not open this weekend.  Return precautions discussed, patient verbalized understanding and is agreeable to plan.  Follow Up Instructions: Patient to continue quarantining at home, seek in-person evaluation if Covid test negative, and has persistent/worsening symptoms.   I discussed the assessment and treatment plan with the patient. The patient was provided an opportunity to ask questions and all were answered. The patient agreed with the plan and demonstrated an understanding of the instructions.   The patient was advised to call back or seek an in-person evaluation if the symptoms worsen or if the condition fails to improve as anticipated.  I provided 20 minutes of non-face-to-face time during this encounter.    Bernard Hall-Potvin, Bernard [name]  08/17/2019 12:03 PM  [name], Bernard [name], Bernard [name] 08/17/19 1203

## 2019-08-17 NOTE — Discharge Instructions (Addendum)
Take Tamiflu as directed. Go to [facility] for [employer] testing.  You may take OTC Tylenol for fever and myalgias.  It is important to drink plenty of water throughout the day to stay hydrated. If you test positive and later develop severe fever, cough, or shortness of breath, it is recommended that you go to the ER for further evaluation.

## 2019-08-20 ENCOUNTER — Encounter: Payer: Self-pay | Admitting: Internal Medicine

## 2019-08-22 ENCOUNTER — Telehealth: Payer: Self-pay | Admitting: Internal Medicine

## 2019-08-22 ENCOUNTER — Telehealth: Payer: Self-pay | Admitting: *Deleted

## 2019-08-22 NOTE — Telephone Encounter (Signed)
Pt called to let you know that he emailed you his test results. He said to call him with any questions.

## 2019-08-22 NOTE — Telephone Encounter (Signed)
If he has a negative Covid test, no fever and no significant symptoms we can do him. If there are any lingering questions let me know. I just think we should postpone it if he is sick, if he is better then we can go ahead. Please make a phone note out of this as well.   Previous Messages  ----- Message -----  From: Laverna Peace, RN  Sent: 08/22/2019  2:17 PM EDT  To: [name], MD   Dr. Carlean Purl,  I spoke with this pt- he did [facility] a [facility]- he said he did call in and let us know about all this and was told it was ok to proceed.  I don't see any documentation of that so I'm not sure he spoke with. He is going to send me a copy of his negative test so we have record of this. Is it ok to proceed?   [name]  ----- Message -----  From: [name], MD  Sent: 08/22/2019  1:56 PM EDT  To: Laverna Peace, RN   He needs to cancel and reschedule - he had a telehealth visit   If he wants a Covid test we can order the novel coranavirus test and he can go to [facility] and get that done  ----- Message -----  From: Laverna Peace, RN  Sent: 08/22/2019 11:13 AM EDT  To: [name], MD   Dr. Carlean Purl,   This pt was seen in the ED on 08-17-19 for flu like symptoms.... no covid test done. His EGD in tomorrow. How would you like to proceed?   Thanks,  [name]       Received pt's covid negative test and Dr. Carlean Purl is aware that pt has no symptoms and we do have record of a negative test.  Test will be scanned into chart.

## 2019-08-23 ENCOUNTER — Ambulatory Visit (AMBULATORY_SURGERY_CENTER): Payer: 59 | Admitting: Internal Medicine

## 2019-08-23 ENCOUNTER — Other Ambulatory Visit: Payer: Self-pay | Admitting: Internal Medicine

## 2019-08-23 ENCOUNTER — Other Ambulatory Visit: Payer: Self-pay

## 2019-08-23 ENCOUNTER — Encounter: Payer: Self-pay | Admitting: Internal Medicine

## 2019-08-23 VITALS — BP 91/51 | HR 58 | Temp 97.6°F | Resp 18

## 2019-08-23 DIAGNOSIS — K2970 Gastritis, unspecified, without bleeding: Secondary | ICD-10-CM | POA: Diagnosis not present

## 2019-08-23 DIAGNOSIS — K317 Polyp of stomach and duodenum: Secondary | ICD-10-CM | POA: Diagnosis not present

## 2019-08-23 DIAGNOSIS — K219 Gastro-esophageal reflux disease without esophagitis: Secondary | ICD-10-CM

## 2019-08-23 MED ORDER — SODIUM CHLORIDE 0.9 % IV SOLN: 500.0000 mL | INTRAVENOUS | Status: DC

## 2019-08-23 MED ORDER — SUCRALFATE 1 G PO TABS: 1.0000 g | ORAL_TABLET | Freq: Three times a day (TID) | ORAL | 2 refills | Status: DC

## 2019-08-23 NOTE — Patient Instructions (Addendum)
I saw some benign-appering gastric polyps - look like fundic gland polyps. These are totally innocent and frequently seen in patients on Dexilant or otherPPI's.  Mild gastritis also seen. Biopsied.  The esophagus looks great.  I am adding a medication called sucralfate or carafate to see if that helps.  I will call with results and recommendations. While I took biopsies f the stomach abnormalities I think it is unlikely they are related to your symptoms.  I appreciate the opportunity to care for you. [name], MD, FACG  YOU HAD AN ENDOSCOPIC PROCEDURE TODAY AT [facility]:   Refer to the procedure report that was given to you for any specific questions about what was found during the examination.  If the procedure report does not answer your questions, please call your gastroenterologist to clarify.  If you requested that your care partner not be given the details of your procedure findings, then the procedure report has been included in a sealed envelope for you to review at your convenience later.  YOU SHOULD EXPECT: Some feelings of bloating in the abdomen. Passage of more gas than usual.  Walking can help get rid of the air that was put into your GI tract during the procedure and reduce the bloating. If you had a lower endoscopy (such as a colonoscopy or flexible sigmoidoscopy) you may notice spotting of blood in your stool or on the toilet paper. If you underwent a bowel prep for your procedure, you may not have a normal bowel movement for a few days.  Please Note:  You might notice some irritation and congestion in your nose or some drainage.  This is from the oxygen used during your procedure.  There is no need for concern and it should clear up in a day or so.  SYMPTOMS TO REPORT IMMEDIATELY:    Following upper endoscopy (EGD)  Vomiting of blood or coffee ground material  New chest pain or pain under the shoulder blades  Painful or persistently difficult  swallowing  New shortness of breath  Fever of 100F or higher  Black, tarry-looking stools  For urgent or emergent issues, a gastroenterologist can be reached at any hour by calling [phone].   DIET:  We do recommend a small meal at first, but then you may proceed to your regular diet.  Drink plenty of fluids but you should avoid alcoholic beverages for 24 hours.  ACTIVITY:  You should plan to take it easy for the rest of today and you should NOT DRIVE or use heavy machinery until tomorrow (because of the sedation medicines used during the test).    FOLLOW UP: Our staff will call the number listed on your records 48-72 hours following your procedure to check on you and address any questions or concerns that you may have regarding the information given to you following your procedure. If we do not reach you, we will leave a message.  We will attempt to reach you two times.  During this call, we will ask if you have developed any symptoms of COVID 19. If you develop any symptoms (ie: fever, flu-like symptoms, shortness of breath, cough etc.) before then, please call [phone].  If you test positive for Covid 19 in the 2 weeks post procedure, please call and report this information to [place].    If any biopsies were taken you will be contacted by phone or by letter within the next 1-3 weeks.  Please call us at [phone]  if you have not heard about the biopsies in 3 weeks.    SIGNATURES/CONFIDENTIALITY: You and/or your care partner have signed paperwork which will be entered into your electronic medical record.  These signatures attest to the fact that that the information above on your After Visit Summary has been reviewed and is understood.  Full responsibility of the confidentiality of this discharge information lies with you and/or your care-partner.

## 2019-08-23 NOTE — Op Note (Addendum)
[facility] Patient Name: Bernard [name] Procedure Date: 08/23/2019 10:01 AM MRN: [id number] Endoscopist: Iva Boop , MD Age: 33 Referring MD:  Date of Birth: 03-04-1986 Gender: Male Account #: [id number] Procedure:                Upper GI endoscopy Indications:              Heartburn, Esophageal reflux symptoms that persist                            despite appropriate therapy Medicines:                Propofol per Anesthesia, Monitored Anesthesia Care Procedure:                Pre-Anesthesia Assessment:                           - Prior to the procedure, a History and Physical                            was performed, and patient medications and                            allergies were reviewed. The patient's tolerance of                            previous anesthesia was also reviewed. The risks                            and benefits of the procedure and the sedation                            options and risks were discussed with the patient.                            All questions were answered, and informed consent                            was obtained. Prior Anticoagulants: The patient has                            taken no previous anticoagulant or antiplatelet                            agents. ASA Grade Assessment: II - A patient with                            mild systemic disease. After reviewing the risks                            and benefits, the patient was deemed in                            satisfactory condition to undergo the procedure.  After obtaining informed consent, the endoscope was                            passed under direct vision. Throughout the                            procedure, the patient's blood pressure, pulse, and                            oxygen saturations were monitored continuously. The                            Endoscope was introduced through the mouth, and   advanced to the second part of duodenum. The upper                            GI endoscopy was accomplished without difficulty.                            The patient tolerated the procedure well. Scope In: Scope Out: Findings:                 The examined esophagus was normal.                           Multiple diminutive semi-sessile polyps were found                            in the gastric fundus and in the gastric body.                            Biopsies were taken with a cold forceps for                            histology. Verification of patient identification                            for the specimen was done. Estimated blood loss was                            minimal.                           Localized mild inflammation characterized by                            erosions, friability and granularity was found in                            the gastric antrum. Biopsies were taken with a cold                            forceps for histology. Verification of patient  identification for the specimen was done. Estimated                            blood loss was minimal.                           The exam was otherwise without abnormality.                           The cardia and gastric fundus were normal on                            retroflexion. Complications:            No immediate complications. Estimated Blood Loss:     Estimated blood loss was minimal. Impression:               - Normal esophagus. Hill Grade 1 GE junction                           - Multiple gastric polyps. Biopsied.                           - Gastritis. Biopsied.                           - The examination was otherwise normal. Recommendation:           - Patient has a contact number available for                            emergencies. The signs and symptoms of potential                            delayed complications were discussed with the                             patient. Return to normal activities tomorrow.                            Written discharge instructions were provided to the                            patient.                           - Resume previous diet.                           - Continue present medications.                           - Await pathology results.                           - Resume previous diet.                           -  Continue present medications.                           - Use sucralfate tablets 1 gram PO QID.                           we discussed how sucralfate is short-term approach                            - cumbersome for him to take.                           also reviewed TIF, baclofen and likely need for a                            24 hr pH and impedance study. will call and he also                            has an upcoming appointment 09/03/19 Iva Boop, MD 08/23/2019 10:21:33 AM This report has been signed electronically.

## 2019-08-23 NOTE — Progress Notes (Signed)
Called to room to assist during endoscopic procedure.  Patient ID and intended procedure confirmed with present staff. Received instructions for my participation in the procedure from the performing physician.  

## 2019-08-23 NOTE — Progress Notes (Signed)
Temp JB  v/s CW I have reviewed the patient's medical history in detail and updated the computerized patient record. 

## 2019-08-23 NOTE — Progress Notes (Signed)
To PACU, VSS. Report to Rn.tb 

## 2019-08-27 ENCOUNTER — Telehealth: Payer: Self-pay

## 2019-08-27 NOTE — Telephone Encounter (Signed)
  Follow up Call-  Call back number 08/23/2019  Post procedure Call Back phone  # [phone]  Permission to leave phone message Yes  Some recent data might be hidden     Patient questions:  Do you have a fever, pain , or abdominal swelling? No. Pain Score  0 *  Have you tolerated food without any problems? Yes.    Have you been able to return to your normal activities? Yes.    Do you have any questions about your discharge instructions: Diet   No. Medications  No. Follow up visit  No.  Do you have questions or concerns about your Care? No.  Actions: * If pain score is 4 or above: No action needed, pain <4.  1. Have you developed a fever since your procedure? no  2.   Have you had an respiratory symptoms (SOB or cough) since your procedure? no  3.   Have you tested positive for COVID 19 since your procedure no  4.   Have you had any family members/close contacts diagnosed with the COVID 19 since your procedure?  no   If yes to any of these questions please route to Joylene John, RN and Alphonsa Gin, Therapist, sports.

## 2019-09-01 ENCOUNTER — Encounter: Payer: Self-pay | Admitting: Family Medicine

## 2019-09-02 ENCOUNTER — Encounter: Payer: Self-pay | Admitting: Internal Medicine

## 2019-09-02 NOTE — Progress Notes (Signed)
My Chart notice   [place],  Biopsies show innocent fundic gland polyps and otherwise normal  Will talk more 11/10  CEG

## 2019-09-03 ENCOUNTER — Encounter: Payer: Self-pay | Admitting: Internal Medicine

## 2019-09-03 ENCOUNTER — Other Ambulatory Visit: Payer: Self-pay

## 2019-09-03 ENCOUNTER — Ambulatory Visit: Payer: 59 | Admitting: Internal Medicine

## 2019-09-03 ENCOUNTER — Ambulatory Visit (INDEPENDENT_AMBULATORY_CARE_PROVIDER_SITE_OTHER)
Admission: RE | Admit: 2019-09-03 | Discharge: 2019-09-03 | Disposition: A | Discharge status: Home / Self Care | Payer: 59 | Source: Ambulatory Visit | Attending: Family Medicine | Admitting: Family Medicine

## 2019-09-03 DIAGNOSIS — K219 Gastro-esophageal reflux disease without esophagitis: Secondary | ICD-10-CM

## 2019-09-03 DIAGNOSIS — K649 Unspecified hemorrhoids: Secondary | ICD-10-CM | POA: Diagnosis not present

## 2019-09-03 DIAGNOSIS — M222X1 Patellofemoral disorders, right knee: Secondary | ICD-10-CM | POA: Diagnosis not present

## 2019-09-03 DIAGNOSIS — M222X2 Patellofemoral disorders, left knee: Secondary | ICD-10-CM

## 2019-09-03 NOTE — Assessment & Plan Note (Addendum)
Consider banding in future Observe for now

## 2019-09-03 NOTE — Progress Notes (Signed)
Bernard [name] 33 y.o. 02/16/1986 [id number]  Assessment & Plan:  GASTROESOPHAGEAL REFLUX DISEASE He has persistent problems despite taking a PPI (Dexilant) and an H2 blocker and even Carafate though he just started that yesterday so we do not know how much that helps.  The questions I have are does he have a hypersensitive esophagus, does he have functional heartburn, does he have weekly acidic or nonacidic reflux that we are not treating.   He definitely has a history consistent with significant acid rebound when stopping PPI even for a day  Plan is to evaluate further with 24-hour pH and impedance study and manometry on PPI and H2 blocker but not on Carafate.  Once I see those results we will determine the next steps.  We did discuss the options of baclofen versus TIF.  If this study is all normal would need to really consider tapering PPI if possible and performing a study off medication.  If he has signs of weakly acidic reflux to correlate with symptoms then I think a TIF might be a next option though would need consultation with Dr. Bryan Lemma    Hemorrhoids Consider banding in future Observe for now   I appreciate the opportunity to care for this patient. CC: Janith Lima, MD    Subjective:   Chief Complaint: Reflux and hemorrhoids  HPI Patient is here because of persistent reflux symptoms he recently underwent an EGD which did not show any esophagitis, he did have some fundic gland polyps and I thought he might have some gastritis but biopsies were normal.  I started Carafate as an adjunct measure because despite taking Dexilant and Pepcid he has symptoms of heartburn.  He only recently started the Carafate within the past day or so so it is too soon to tell if that helps.  He has had a long history of heartburn problems for the last 15 years that have been somewhat refractory to therapy.  No dysphagia.  Other issue has been hemorrhoids, he had swelling and external  hemorrhoid that does not sound like a prolapsed internal hemorrhoid as he could not reduce it and using fiber and conservative measures and now using Tucks pads with witch hazel he is tolerating things.  Wants to observe as opposed to any type of banding. Allergies  Allergen Reactions  . Mold Extract [Trichophyton]     Sneezing, runny nose.    Current Meds  Medication Sig  . cetirizine (ZYRTEC) 10 MG tablet Take 10 mg by mouth every morning.   . Cholecalciferol (VITAMIN D) 50 MCG (2000 UT) CAPS Take 1 capsule by mouth daily.  Marland Kitchen dexlansoprazole (DEXILANT) 60 MG capsule TAKE 1 CAPSULE BY MOUTH  DAILY BEFORE BREAKFAST  . famotidine (PEPCID) 20 MG tablet TAKE 1 TABLET(20 MG) BY MOUTH TWICE DAILY  . ferrous sulfate 324 MG TBEC Take 324 mg by mouth every other day.  . Multiple Vitamin (MULTIVITAMIN WITH MINERALS) TABS tablet Take 1 tablet by mouth daily.  . sucralfate (CARAFATE) 1 g tablet Take 1 tablet (1 g total) by mouth 4 (four) times daily -  with meals and at bedtime.   Past Medical History:  Diagnosis Date  . Fundic gland polyposis of stomach   . GERD (gastroesophageal reflux disease)   . Hemorrhoid   . Sebaceous cyst    Past Surgical History:  Procedure Laterality Date  . ESOPHAGOGASTRODUODENOSCOPY N/A 05/18/2015   Procedure: ESOPHAGOGASTRODUODENOSCOPY (EGD);  Surgeon: Inda Castle, MD;  Location: [facility];  Service:  Endoscopy;  Laterality: N/A;  . 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 Tora Perches stepson   Very occasional alcohol he is a former smoker   No drug use   family history includes Breast cancer in his maternal grandmother; GER disease in his father; Heart disease in his father; Hypertension in his father.   Review of Systems As per HPI  Objective:   Physical Exam BP 120/84   Pulse 60   Temp 98.7 F (37.1 C)   Ht 5\' 9"  (1.753 m)   Wt 167 lb (75.8  kg)   BMI 24.66 kg/m  Trim well-developed well-nourished young white man no acute distress  25 minutes time spent with patient > half in counseling coordination of care

## 2019-09-03 NOTE — Assessment & Plan Note (Addendum)
He has persistent problems despite taking a PPI (Dexilant) and an H2 blocker and even Carafate though he just started that yesterday so we do not know how much that helps.  The questions I have are does he have a hypersensitive esophagus, does he have functional heartburn, does he have weekly acidic or nonacidic reflux that we are not treating.   He definitely has a history consistent with significant acid rebound when stopping PPI even for a day  Plan is to evaluate further with 24-hour pH and impedance study and manometry on PPI and H2 blocker but not on Carafate.  Once I see those results we will determine the next steps.  We did discuss the options of baclofen versus TIF.  If this study is all normal would need to really consider tapering PPI if possible and performing a study off medication.  If he has signs of weakly acidic reflux to correlate with symptoms then I think a TIF might be a next option though would need consultation with Dr. Bryan Lemma

## 2019-09-03 NOTE — Patient Instructions (Signed)
You have been scheduled for an esophageal manometry test at [facility] on 10/09/2019 at 8:30AM. Please arrive 30 minutes prior to your procedure for registration. You will need to go to outpatient registration (1st floor of the hospital) first. Make certain to bring your insurance cards as well as a complete list of medications.  Please remember the following:  1) Do not take any muscle relaxants, xanax (alprazolam) or ativan for 1 day prior to your test as well as the day of the test.  2) Nothing to eat or drink after 12:00 midnight on the night before your test.  3) Hold all diabetic medications/insulin the morning of the test. You may eat and take your medications after the test.  It will take at least 2 weeks to receive the results of this test from your physician.  ------------------------------------------ ABOUT ESOPHAGEAL MANOMETRY Esophageal manometry (muh-NOM-uh-tree) is a test that gauges how well your esophagus works. Your esophagus is the long, muscular tube that connects your throat to your stomach. Esophageal manometry measures the rhythmic muscle contractions (peristalsis) that occur in your esophagus when you swallow. Esophageal manometry also measures the coordination and force exerted by the muscles of your esophagus.  During esophageal manometry, a thin, flexible tube (catheter) that contains sensors is passed through your nose, down your esophagus and into your stomach. Esophageal manometry can be helpful in diagnosing some mostly uncommon disorders that affect your esophagus.  Why it's done Esophageal manometry is used to evaluate the movement (motility) of food through the esophagus and into the stomach. The test measures how well the circular bands of muscle (sphincters) at the top and bottom of your esophagus open and close, as well as the pressure, strength and pattern of the wave of esophageal muscle contractions that moves food along.  What you can  expect Esophageal manometry is an outpatient procedure done without sedation. Most people tolerate it well. You may be asked to change into a hospital gown before the test starts.  During esophageal manometry  . While you are sitting up, a member of your health care team sprays your throat with a numbing medication or puts numbing gel in your nose or both.  . A catheter is guided through your nose into your esophagus. The catheter may be sheathed in a water-filled sleeve. It doesn't interfere with your breathing. However, your eyes may water, and you may gag. You may have a slight nosebleed from irritation.  . After the catheter is in place, you may be asked to lie on your back on an exam table, or you may be asked to remain seated.  . You then swallow small sips of water. As you do, a computer connected to the catheter records the pressure, strength and pattern of your esophageal muscle contractions.  . During the test, you'll be asked to breathe slowly and smoothly, remain as still as possible, and swallow only when you're asked to do so.  . A member of your health care team may move the catheter down into your stomach while the catheter continues its measurements.  . The catheter then is slowly withdrawn. The test usually lasts 20 to 30 minutes.  After esophageal manometry  When your esophageal manometry is complete, you may return to your normal activities  This test typically takes 30-45 minutes to complete. ________________________________________________________________________________    Stop your carafate 2 days before your procedure but stay on your other medicines per Dr Carlean Purl.  Due to recent COVID-19 restrictions implemented by  our local and state authorities and in an effort to keep both patients and staff as safe as possible, our hospital system now requires COVID-19 testing prior to any scheduled hospital procedure. Please go to our [facility] location drive thru  testing site ([address] [place], [place] [address]) on 10/04/2019 at  8:30AM. There will be multiple testing areas, the first checkpoint being for pre-procedure/surgery testing. Get into the right (yellow) lane that leads to the PAT testing team. You will not be billed at the time of testing but may receive a bill later depending on your insurance. The approximate cost of the test is $100. You must agree to quarantine from the time of your testing until the procedure date on 10/09/2019 . This should include staying at home with ONLY the people you live with. Avoid take-out, grocery store shopping or leaving the house for any non-emergent reason. Failure to have your COVID-19 test done on the date and time you have been scheduled will result in cancellation of procedure. Please call our office at [phone] if you have any questions.    I appreciate the opportunity to care for you. Stan Head, MD, [facility]

## 2019-09-16 ENCOUNTER — Ambulatory Visit (INDEPENDENT_AMBULATORY_CARE_PROVIDER_SITE_OTHER): Payer: 59 | Admitting: Internal Medicine

## 2019-09-16 ENCOUNTER — Ambulatory Visit: Payer: 59 | Admitting: Internal Medicine

## 2019-09-16 ENCOUNTER — Other Ambulatory Visit: Payer: Self-pay

## 2019-09-16 ENCOUNTER — Encounter: Payer: Self-pay | Admitting: Internal Medicine

## 2019-09-16 VITALS — BP 118/76 | HR 72 | Temp 99.0°F | Ht 69.0 in | Wt 162.0 lb

## 2019-09-16 DIAGNOSIS — E559 Vitamin D deficiency, unspecified: Secondary | ICD-10-CM

## 2019-09-16 DIAGNOSIS — Z0000 Encounter for general adult medical examination without abnormal findings: Secondary | ICD-10-CM

## 2019-09-16 DIAGNOSIS — I10 Essential (primary) hypertension: Secondary | ICD-10-CM

## 2019-09-16 DIAGNOSIS — K219 Gastro-esophageal reflux disease without esophagitis: Secondary | ICD-10-CM | POA: Diagnosis not present

## 2019-09-16 NOTE — Progress Notes (Signed)
Subjective:  Patient ID: Bernard [name], male    DOB: 1986/07/04  Age: 33 y.o. MRN: [id number]  CC: Annual Exam   This visit occurred during the SARS-CoV-2 public health emergency.  Safety protocols were in place, including screening questions prior to the visit, additional usage of staff PPE, and extensive cleaning of exam room while observing appropriate contact time as indicated for disinfecting solutions.    HPI Bernard [name] presents for a CPX.   He continues to struggle with heartburn.  He recently underwent an upper endoscopy.  He has intentionally lost weight recently.  He denies odynophagia, dysphagia, abdominal pain, melena, or bright red blood per rectum.  He is taking a PPI, H2 blocker, and sucralfate.  He has a remote history of elevated blood pressure but with lifestyle modifications his blood pressure has been normal and he has not diagnosed or treated for hypertension.   Outpatient Medications Prior to Visit  Medication Sig Dispense Refill  . cetirizine (ZYRTEC) 10 MG tablet Take 10 mg by mouth every morning.     . Cholecalciferol (VITAMIN D) 50 MCG (2000 UT) CAPS Take 1 capsule by mouth daily.    Marland Kitchen dexlansoprazole (DEXILANT) 60 MG capsule TAKE 1 CAPSULE BY MOUTH  DAILY BEFORE BREAKFAST 90 capsule 1  . famotidine (PEPCID) 20 MG tablet TAKE 1 TABLET(20 MG) BY MOUTH TWICE DAILY 180 tablet 0  . Multiple Vitamin (MULTIVITAMIN WITH MINERALS) TABS tablet Take 1 tablet by mouth daily.    . sucralfate (CARAFATE) 1 g tablet Take 1 tablet (1 g total) by mouth 4 (four) times daily -  with meals and at bedtime. 120 tablet 2  . ferrous sulfate 324 MG TBEC Take 324 mg by mouth every other day.     No facility-administered medications prior to visit.     ROS Review of Systems  Constitutional: Negative for diaphoresis, fatigue and unexpected weight change.  HENT: Negative.  Negative for trouble swallowing and voice change.   Eyes: Negative.   Respiratory: Negative for cough,  chest tightness, shortness of breath and wheezing.   Cardiovascular: Negative for chest pain, palpitations and leg swelling.  Gastrointestinal: Negative for abdominal pain, blood in stool, diarrhea, nausea and vomiting.  Endocrine: Negative.   Genitourinary: Negative.  Negative for difficulty urinating.  Musculoskeletal: Negative for back pain and myalgias.  Skin: Negative.   Neurological: Negative for dizziness, weakness and light-headedness.  Hematological: Negative for adenopathy. Does not bruise/bleed easily.  Psychiatric/Behavioral: Negative.     Objective:  BP 118/76 (BP Location: Left Arm, Patient Position: Sitting, Cuff Size: Normal)   Pulse 72   Temp 99 F (37.2 C) (Oral)   Ht 5\' 9"  (1.753 m)   Wt 162 lb (73.5 kg)   SpO2 98%   BMI 23.92 kg/m   BP Readings from Last 3 Encounters:  09/16/19 118/76  09/03/19 120/84  08/23/19 (!) 91/51    Wt Readings from Last 3 Encounters:  09/16/19 162 lb (73.5 kg)  09/03/19 167 lb (75.8 kg)  08/13/19 167 lb (75.8 kg)    Physical Exam Vitals signs reviewed.  Constitutional:      Appearance: Normal appearance.  HENT:     Nose: Nose normal.     Mouth/Throat:     Mouth: Mucous membranes are moist.  Eyes:     General: No scleral icterus.    Conjunctiva/sclera: Conjunctivae normal.  Neck:     Musculoskeletal: Neck supple.  Cardiovascular:     Rate and Rhythm: Normal rate  and regular rhythm.     Heart sounds: No murmur.  Pulmonary:     Effort: Pulmonary effort is normal.     Breath sounds: No stridor. No wheezing, rhonchi or rales.  Abdominal:     General: Abdomen is flat. Bowel sounds are normal. There is no distension.     Palpations: Abdomen is soft. There is no hepatomegaly or splenomegaly.     Tenderness: There is no abdominal tenderness.  Musculoskeletal: Normal range of motion.     Right lower leg: No edema.     Left lower leg: No edema.  Lymphadenopathy:     Cervical: No cervical adenopathy.  Skin:    General:  Skin is warm and dry.     Coloration: Skin is not pale.  Neurological:     General: No focal deficit present.     Mental Status: He is alert.  Psychiatric:        Mood and Affect: Mood normal.        Behavior: Behavior normal.     Lab Results  Component Value Date   WBC 5.4 09/17/2019   HGB 15.3 09/17/2019   HCT 44.9 09/17/2019   PLT 258.0 09/17/2019   GLUCOSE 97 08/13/2019   CHOL 137 09/17/2019   TRIG 115.0 09/17/2019   HDL 47.60 09/17/2019   LDLCALC 66 09/17/2019   ALT 29 08/13/2019   AST 27 08/13/2019   NA 139 08/13/2019   K 3.7 08/13/2019   CL 101 08/13/2019   CREATININE 0.98 08/13/2019   BUN 16 08/13/2019   CO2 30 08/13/2019   TSH 1.27 08/13/2019   HGBA1C 5.6 02/28/2017    Dg Knee Bilateral Standing Ap  Result Date: 09/03/2019 CLINICAL DATA:  Bilateral knee pain EXAM: BILATERAL KNEES STANDING - 1 VIEW COMPARISON:  None. FINDINGS: There is no acute displaced fracture or dislocation. The joint spaces are relatively well preserved. There is increased sclerosis involving the medial distal femoral diaphysis on the left. 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 IMPRESSION: 1. No acute osseous abnormality. 2. The joint spaces are relatively well preserved. Electronically Signed   By: Constance Holster M.D.   On: 09/03/2019 21:33    Assessment & Plan:   Rogue was seen today for annual exam.  Diagnoses and all orders for this visit:  Vitamin D insufficiency- His vitamin D level is in the normal range. -     Vitamin D 25 hydroxy; Future  Routine general medical examination at a health care facility- Exam completed, labs reviewed, vaccines reviewed and updated, patient education was given. -     Lipid panel; Future -     HIV antibody (Reflex); Future  Essential hypertension- His blood pressure is very well controlled on no medications.  I do not think he has hypertension.  He most likely had 1 elevated blood pressure during an office visit but  has been normotensive for years now. -     CBC with Differential; Future  Gastroesophageal reflux disease without esophagitis -     CBC with Differential; Future   I have discontinued Kedric R. Stegemann's ferrous sulfate. I am also having him maintain his cetirizine, multivitamin with minerals, Dexilant, famotidine, Vitamin D, and sucralfate.  No orders of the defined types were placed in this encounter.    Follow-up: Return in about 1 year (around 09/15/2020).  Scarlette Calico, MD

## 2019-09-16 NOTE — Patient Instructions (Signed)

## 2019-09-17 ENCOUNTER — Other Ambulatory Visit (INDEPENDENT_AMBULATORY_CARE_PROVIDER_SITE_OTHER): Payer: 59

## 2019-09-17 DIAGNOSIS — I10 Essential (primary) hypertension: Secondary | ICD-10-CM | POA: Diagnosis not present

## 2019-09-17 DIAGNOSIS — E559 Vitamin D deficiency, unspecified: Secondary | ICD-10-CM | POA: Diagnosis not present

## 2019-09-17 DIAGNOSIS — K219 Gastro-esophageal reflux disease without esophagitis: Secondary | ICD-10-CM | POA: Diagnosis not present

## 2019-09-17 DIAGNOSIS — Z0000 Encounter for general adult medical examination without abnormal findings: Secondary | ICD-10-CM

## 2019-09-17 LAB — VITAMIN D 25 HYDROXY (VIT D DEFICIENCY, FRACTURES): VITD: 49.36 ng/mL (ref 30.00–100.00)

## 2019-09-17 LAB — CBC WITH DIFFERENTIAL/PLATELET
Basophils Absolute: 0 10*3/uL (ref 0.0–0.1)
Basophils Relative: 0.9 % (ref 0.0–3.0)
Eosinophils Absolute: 0.2 10*3/uL (ref 0.0–0.7)
Eosinophils Relative: 3.4 % (ref 0.0–5.0)
HCT: 44.9 % (ref 39.0–52.0)
Hemoglobin: 15.3 g/dL (ref 13.0–17.0)
Lymphocytes Relative: 31.1 % (ref 12.0–46.0)
Lymphs Abs: 1.7 10*3/uL (ref 0.7–4.0)
MCHC: 34 g/dL (ref 30.0–36.0)
MCV: 88 fl (ref 78.0–100.0)
Monocytes Absolute: 0.4 10*3/uL (ref 0.1–1.0)
Monocytes Relative: 7.8 % (ref 3.0–12.0)
Neutro Abs: 3.1 10*3/uL (ref 1.4–7.7)
Neutrophils Relative %: 56.8 % (ref 43.0–77.0)
Platelets: 258 10*3/uL (ref 150.0–400.0)
RBC: 5.11 Mil/uL (ref 4.22–5.81)
RDW: 13.1 % (ref 11.5–15.5)
WBC: 5.4 10*3/uL (ref 4.0–10.5)

## 2019-09-17 LAB — LIPID PANEL
Cholesterol: 137 mg/dL (ref 0–200)
HDL: 47.6 mg/dL (ref >39.00)
LDL Cholesterol: 66 mg/dL (ref 0–99)
NonHDL: 89.04
Total CHOL/HDL Ratio: 3
Triglycerides: 115 mg/dL (ref 0.0–149.0)
VLDL: 23 mg/dL (ref 0.0–40.0)

## 2019-09-18 ENCOUNTER — Encounter: Payer: Self-pay | Admitting: Internal Medicine

## 2019-09-18 LAB — HIV ANTIBODY (ROUTINE TESTING W REFLEX): HIV 1&2 Ab, 4th Generation: NONREACTIVE

## 2019-09-25 ENCOUNTER — Ambulatory Visit: Payer: 59 | Attending: Family Medicine | Admitting: Physical Therapy

## 2019-09-25 ENCOUNTER — Ambulatory Visit (INDEPENDENT_AMBULATORY_CARE_PROVIDER_SITE_OTHER): Payer: 59 | Admitting: Family Medicine

## 2019-09-25 ENCOUNTER — Encounter: Payer: Self-pay | Admitting: Family Medicine

## 2019-09-25 ENCOUNTER — Encounter: Payer: Self-pay | Admitting: Physical Therapy

## 2019-09-25 ENCOUNTER — Other Ambulatory Visit: Payer: Self-pay

## 2019-09-25 DIAGNOSIS — M25561 Pain in right knee: Secondary | ICD-10-CM | POA: Insufficient documentation

## 2019-09-25 DIAGNOSIS — M222X1 Patellofemoral disorders, right knee: Secondary | ICD-10-CM | POA: Diagnosis not present

## 2019-09-25 DIAGNOSIS — G8929 Other chronic pain: Secondary | ICD-10-CM | POA: Insufficient documentation

## 2019-09-25 DIAGNOSIS — M6281 Muscle weakness (generalized): Secondary | ICD-10-CM | POA: Insufficient documentation

## 2019-09-25 DIAGNOSIS — M222X2 Patellofemoral disorders, left knee: Secondary | ICD-10-CM

## 2019-09-25 DIAGNOSIS — M25562 Pain in left knee: Secondary | ICD-10-CM | POA: Insufficient documentation

## 2019-09-25 NOTE — Therapy (Signed)
[facility] [address] [place], [place], [address] Phone: [phone]   Fax:  [phone]  Physical Therapy Evaluation  Patient Details  Name: Bernard [name] MRN: [id number] Date of Birth: 1986/10/23 Referring Provider (PT): Judi Saa, DO   Encounter Date: 09/25/2019  PT End of Session - 09/25/19 1516    Visit Number  1    Number of Visits  8    Date for PT Re-Evaluation  11/20/19    Authorization Type  [employer]    PT Start Time  1401    PT Stop Time  1450    PT Time Calculation (min)  49 min    Activity Tolerance  Patient tolerated treatment well    Behavior During Therapy  [facility] for tasks assessed/performed       Past Medical History:  Diagnosis Date  . Fundic gland polyposis of stomach   . GERD (gastroesophageal reflux disease)   . Hemorrhoid   . Sebaceous cyst     Past Surgical History:  Procedure Laterality Date  . ESOPHAGOGASTRODUODENOSCOPY N/A 05/18/2015   Procedure: ESOPHAGOGASTRODUODENOSCOPY (EGD);  Surgeon: Louis Meckel, MD;  Location: [facility];  Service: Endoscopy;  Laterality: N/A;  . WISDOM TOOTH EXTRACTION      There were no vitals filed for this visit.   Subjective Assessment - 09/25/19 1401    Subjective  Patient reports 1-2 year period of knee pain when he pushed himself too hard with running. In early September he went on an hour long run, and since that time he has been experiencing bilateral patellar pain that has been progressively worse. He no pretty much always feels a sensation when he is standing, and sometimes not standing. He has been following his doctors direction on exercise progression and he may recieve a steroid injection in 4 weeks if no improvement. Currently he has stopped running, and does a combination of strength training and boxing. He has been doing little to no weight with strength training. 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    How long can you sit comfortably?  No limitation    How long can you stand comfortably?  Patient reports mild sensation with all standing    How long can you walk comfortably?  Patient reports mild sensation with all walking    Diagnostic tests  X-ray    Patient Stated Goals  Reducepain so he can return to running    Currently in Pain?  Yes    Pain Score  1    worst: 2   Pain Location  Knee    Pain Orientation  Right;Left    Pain Descriptors / Indicators  Aching    Pain Type  Chronic pain    Pain Onset  More than a month ago    Pain Frequency  Constant    Aggravating Factors   Anything weight bearing    Pain Relieving Factors  Ice with minimal benefit    Effect of Pain on Daily Activities  Patient is not able to run at this time.         [facility] PT Assessment - 09/25/19 0001      Assessment   Medical Diagnosis  Chronic bilateral knee pain    Referring Provider (PT)  Judi Saa, DO    Onset Date/Surgical Date  --   Patient reports about  a year history, worsened in September   Hand Dominance  Right    Next MD Visit  10/23/2019    Prior Therapy  No      Precautions   Precautions  None      Restrictions   Weight Bearing Restrictions  No      Balance Screen   Has the patient fallen in the past 6 months  No    Has the patient had a decrease in activity level because of a fear of falling?   No    Is the patient reluctant to leave their home because of a fear of falling?   No      Home Public house managernvironment   Living Environment  Private residence      Prior Function   Level of Independence  Independent      Cognition   Overall Cognitive Status  Within Functional Limits for tasks assessed      Observation/Other Assessments   Focus on Therapeutic Outcomes (FOTO)   39% limited      Observation/Other Assessments-Edema    Edema  --   None noted     Sensation   Light Touch  Appears Intact      Functional Tests    Functional tests  Squat;Single Leg Squat      Squat   Comments  Patient exhibits toe out with minimal dyanmic knee valgus bilaterally, increased forward trunk lean with decreased squat depth       Single Leg Squat   Comments  Patient exhibits bilateral dynamic knee valgus and contralateral hip drop      Posture/Postural Control   Posture Comments  Patient exhibits a possible patella alta with laterally sitting at resting position      ROM / Strength   AROM / PROM / Strength  AROM;Strength      AROM   Overall AROM Comments  All hip, knee, and ankle AROM WFL, he does have slight hypermobility of knee      Strength   Overall Strength Comments  No pain with strength testing    Strength Assessment Site  Hip;Knee    Right/Left Hip  Right;Left    Right Hip Flexion  4/5    Right Hip Extension  4/5    Right Hip External Rotation   4/5    Right Hip Internal Rotation  4/5    Right Hip ABduction  4-/5    Left Hip Flexion  4/5    Left Hip Extension  4/5    Left Hip External Rotation  4/5    Left Hip Internal Rotation  4/5    Left Hip ABduction  4-/5    Right/Left Knee  Right;Left    Right Knee Flexion  5/5    Right Knee Extension  5/5    Left Knee Flexion  5/5    Left Knee Extension  5/5      Flexibility   Soft Tissue Assessment /Muscle Length  yes    Hamstrings  Normal    Quadriceps  Normal    ITB  Normal      Palpation   Patella mobility  Hypermobile    Palpation comment  Patient reports minimal pain along medail>lateral peripatellar region      Special Tests    Special Tests  Knee Special Tests    Other special tests  All stability and meniscal testing negative      Transfers   Transfers  Independent with all Transfers  Objective measurements completed on examination: See above findings.      [facility] PT Treatment/Exercise - 09/25/19 0001      Exercises   Exercises  Knee/Hip      Knee/Hip Exercises: Supine   Bridges  10 reps     Bridges Limitations  Trialed bridge with red band around knees and green band across hips, pt reported ease of exercise    Single Leg Bridge  --   8 reps     Knee/Hip Exercises: Sidelying   Clams  x15 with red band             PT Education - 09/25/19 1515    Education Details  Exam findings, POC, HEP, exercise progression and modifications    Person(s) Educated  Patient    Methods  Explanation;Demonstration;Verbal cues;Handout    Comprehension  Verbalized understanding;Returned demonstration;Verbal cues required;Need further instruction       PT Short Term Goals - 09/25/19 1525      PT SHORT TERM GOAL #1   Title  Patient will be independent in initial HEP to maintain progress in PT.    Baseline  HEP given at eval    Time  3    Period  Weeks    Status  New    Target Date  10/16/19      PT SHORT TERM GOAL #2   Title  Patient will report reduced bilateral knee pain to < or = 0-1/10 with work activities.    Baseline  1-2/10 at work    Time  4    Period  Weeks    Status  New    Target Date  10/23/19        PT Long Term Goals - 09/25/19 1528      PT LONG TERM GOAL #1   Title  Patient will exhibit improved strength of gross LE > or = 4+/5 in order to improve dynamic control with single leg squat to reduce knee pain.    Time  8    Period  Weeks    Status  New    Target Date  11/20/19      PT LONG TERM GOAL #2   Title  Patient will report improved functional level of FOTO < or = 23% limitation.    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    Time  8    Period  Weeks    Status  New    Target Date  11/20/19              Plan - 09/25/19 1517    Clinical Impression Statement  Patient presents to PT with report of chronic bilateral knee pain that worsened in September but has plateaued over the past month. His symptoms are consistent with patellofemal pain and he exhibits strength deficit of the gluteals with impairments in squat form placing greater stress on  the knee. He was educated on exercise progression and modifications, and was given exercises to initiate gluteal strengthening. He would benefit from continued skilled PT to progress his strength and dynamic control in order to reduce knee pain with running and at work.    Personal Factors and Comorbidities  Time since onset of injury/illness/exacerbation;Profession    Examination-Activity Limitations  Locomotion Level;Squat;Stairs;Stand;Lift    Examination-Participation Restrictions  Community Activity    Stability/Clinical Decision Making  Stable/Uncomplicated    Clinical Decision Making  Low    Rehab Potential  Good    PT Frequency  1x / week    PT Duration  8 weeks    PT Treatment/Interventions  Cryotherapy;Publishing copy;Therapeutic activities;Therapeutic exercise;Balance training;Neuromuscular re-education;Patient/family education;Dry needling;Manual techniques;Taping;Joint Manipulations    PT Next Visit Plan  Assess HEP and other exercises he is performing from doctor and in gym, trial patellar taping to assess pain respones, progress gluteal/core/and quad strengthening with focus on avoiding dynamic knee valgus    PT Home Exercise Plan  Clamshell with red band, single leg bridge with contralateral knee extension (hip thrust with resistance at gym); patient is also performing gym based exercises    Consulted and Agree with Plan of Care  Patient       Patient will benefit from skilled therapeutic intervention in order to improve the following deficits and impairments:  Decreased activity  tolerance, Pain, Decreased strength, Improper body mechanics, Decreased balance  Visit Diagnosis: Chronic pain of right knee  Chronic pain of left knee  Muscle weakness (generalized)     Problem List Patient Active Problem List   Diagnosis Date Noted  . Vitamin D insufficiency 03/16/2018  . Essential hypertension 03/15/2018  . Patellofemoral syndrome of both knees 06/09/2017  . Benign hypermobility syndrome 06/09/2017  . Hemorrhoids 12/23/2015  . Routine general medical examination at a health care facility 02/16/2015  . GASTROESOPHAGEAL REFLUX DISEASE 12/29/2007    Rosana Hoes, PT, DPT, LAT, ATC 09/25/19  3:38 PM Phone: [phone] Fax: [phone]   [facility] [address] [place], [place], [address] Phone: [phone]   Fax:  [phone]  Name: Bernard [name] MRN: [id number] Date of Birth: 06-Mar-1986

## 2019-09-25 NOTE — Assessment & Plan Note (Signed)
Discussed shoe choices, discussed home exercise, icing regimen, which activities to do which wants to avoid.  Patient will increase activity slowly over the course the next several weeks.  Patient encouraged to try physical therapy which she has not gone to yet but is starting it today.  Discussed if continuing to have difficulty I would consider injections next.

## 2019-09-25 NOTE — Progress Notes (Signed)
[name] [address] [place], [place] [address] Phone: [phone] Subjective:   I Ronelle Nigh am serving as a Neurosurgeon for Dr. Antoine Primas.   This visit occurred during the SARS-CoV-2 public health emergency.  Safety protocols were in place, including screening questions prior to the visit, additional usage of staff PPE, and extensive cleaning of exam room while observing appropriate contact time as indicated for disinfecting solutions.     CC: bilateral knee pain   [id number]   08/13/2019 Underlying hypermobility syndrome and patellofemoral syndrome both knees.  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  Declined formal physical therapy.  Declined injection.  Laboratory work-up to further evaluate for any other deficiencies that could be contributing to patient having increasing amount of pain out of proportion to what is found on physical exam today.  Follow-up with me again in 6 weeks  Update 09/25/2019 OAK DOREY is a 33 y.o. male coming in with complaint of bilateral knee pain. Patient states he believes his knees have gotten worse. Does not know why. Started increasing weight a little but avoiding heavy lifting with LE.  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     Past Medical History:  Diagnosis Date  . Fundic gland polyposis of stomach   . GERD (gastroesophageal reflux disease)   . Hemorrhoid   . Sebaceous cyst    Past Surgical History:  Procedure Laterality Date  . ESOPHAGOGASTRODUODENOSCOPY N/A 05/18/2015   Procedure: ESOPHAGOGASTRODUODENOSCOPY (EGD);  Surgeon: Louis Meckel, MD;  Location: [facility];  Service: Endoscopy;  Laterality: N/A;  . WISDOM TOOTH EXTRACTION     Social History   Socioeconomic History  . Marital status: Married   Spouse name: Not on file  . Number of children: 0  . Years of education: Not on file  . Highest education level: Not on file  Occupational History  . Occupation: Technical sales engineer: [employer]  Social Needs  . Financial resource strain: Not on file  . Food insecurity    Worry: Not on file    Inability: Not on file  . Transportation needs    Medical: Not on file    Non-medical: Not on file  Tobacco Use  . Smoking status: Former Smoker    Packs/day: 1.00    Years: 10.00    Pack years: 10.00    Types: E-cigarettes, Cigarettes    Quit date: 11/24/2012    Years since quitting: 6.8  . Smokeless tobacco: Never Used  . Tobacco comment: quit e cigarette 2016  Substance and Sexual Activity  . Alcohol use: Yes    Alcohol/week: 0.0 standard drinks    Comment: occasional  . Drug use: No  . Sexual activity: Yes    Partners: Female  Lifestyle  . Physical activity    Days per week: Not on file    Minutes per session: Not on file  . Stress: Not on file  Relationships  . Social Musician on phone: Not on file    Gets together: Not on file    Attends religious service: Not on file    Active member of club or organization: Not on file    Attends meetings of clubs or organizations: Not on file  Relationship status: Not on file  Other Topics Concern  . 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 Estill Bamberg stepson   Very occasional alcohol he is a former smoker   No drug use   Allergies  Allergen Reactions  . Mold Extract [Trichophyton]     Sneezing, runny nose.    Family History  Problem Relation Age of Onset  . Breast cancer Maternal Grandmother   . Heart disease Father        valve replacement  . Hypertension Father   . GER disease Father   . Colon cancer Neg Hx       Current Outpatient Medications (Respiratory):  .  cetirizine (ZYRTEC) 10 MG tablet, Take 10 mg by mouth every  morning.     Current Outpatient Medications (Other):  Marland Kitchen  Cholecalciferol (VITAMIN D) 50 MCG (2000 UT) CAPS, Take 1 capsule by mouth daily. Marland Kitchen  dexlansoprazole (DEXILANT) 60 MG capsule, TAKE 1 CAPSULE BY MOUTH  DAILY BEFORE BREAKFAST .  famotidine (PEPCID) 20 MG tablet, TAKE 1 TABLET(20 MG) BY MOUTH TWICE DAILY .  Multiple Vitamin (MULTIVITAMIN WITH MINERALS) TABS tablet, Take 1 tablet by mouth daily. .  sucralfate (CARAFATE) 1 g tablet, Take 1 tablet (1 g total) by mouth 4 (four) times daily -  with meals and at bedtime.    Past medical history, social, surgical and family history all reviewed in electronic medical record.  No pertanent information unless stated regarding to the chief complaint.   Review of Systems:  No headache, visual changes, nausea, vomiting, diarrhea, constipation, dizziness, abdominal pain, skin rash, fevers, chills, night sweats, weight loss, swollen lymph nodes, body aches, joint swelling, muscle aches, chest pain, shortness of breath, mood changes.   Objective  Blood pressure 100/70, pulse 82, height 5\' 9"  (1.753 m), SpO2 98 %. Systems examined below as of    General: No apparent distress alert and oriented x3 mood and affect normal, dressed appropriately.  HEENT: Pupils equal, extraocular movements intact  Respiratory: Patient's speak in full sentences and does not appear short of breath  Cardiovascular: No lower extremity edema, non tender, no erythema  Skin: Warm dry intact with no signs of infection or rash on extremities or on axial skeleton.  Abdomen: Soft nontender  Neuro: Cranial nerves II through XII are intact, neurovascularly intact in all extremities with 2+ DTRs and 2+ pulses.  Lymph: No lymphadenopathy of posterior or anterior cervical chain or axillae bilaterally.  Gait normal with good balance and coordination.  MSK:  Non tender with full range of motion and good stability and symmetric strength and tone of shoulders, elbows, wrist, hip, knee  and ankles bilaterally.  Knee: Bilateral Normal to inspection with no erythema or effusion or obvious bony abnormalities.  Very mild patella alta noted Palpation normal with no warmth, joint line tenderness, patellar tenderness, or condyle tenderness. ROM full in flexion and extension and lower leg rotation. Ligaments with solid consistent endpoints including ACL, PCL, LCL, MCL. Negative Mcmurray's, Apley's, and Thessalonian tests. Non painful patellar compression. Patellar glide without crepitus. Patellar and quadriceps tendons unremarkable. Hamstring and quadriceps strength is normal.   Impression and Recommendations:     . The above documentation has been reviewed and is accurate and complete Lyndal Pulley, DO       Note: This dictation was prepared with Dragon dictation along with smaller phrase technology. Any transcriptional errors that  result from this process are unintentional.

## 2019-09-25 NOTE — Patient Instructions (Signed)
Lets try PT Get some new boots Continue exercises See me in 4 weeks if no better we will inject

## 2019-09-25 NOTE — Patient Instructions (Signed)
Access Code: [email or web address]  URL: [email or web address]  Date: 09/25/2019  Prepared by: Hilda Blades   Exercises Clamshell with Resistance - 15 reps - 3 sets - 4x weekly Single Leg Bridge - 8 reps - 3 sets - 4x weekly

## 2019-10-04 ENCOUNTER — Other Ambulatory Visit (HOSPITAL_COMMUNITY)
Admission: RE | Admit: 2019-10-04 | Discharge: 2019-10-04 | Disposition: A | Discharge status: Home / Self Care | Payer: 59 | Source: Ambulatory Visit | Attending: Internal Medicine | Admitting: Internal Medicine

## 2019-10-04 ENCOUNTER — Encounter: Payer: 59 | Admitting: Physical Therapy

## 2019-10-04 DIAGNOSIS — Z20828 Contact with and (suspected) exposure to other viral communicable diseases: Secondary | ICD-10-CM | POA: Diagnosis not present

## 2019-10-04 DIAGNOSIS — Z01812 Encounter for preprocedural laboratory examination: Secondary | ICD-10-CM | POA: Insufficient documentation

## 2019-10-05 LAB — NOVEL CORONAVIRUS, NAA (HOSP ORDER, SEND-OUT TO REF LAB; TAT 18-24 HRS): SARS-CoV-2, NAA: NOT DETECTED

## 2019-10-08 ENCOUNTER — Telehealth: Payer: Self-pay | Admitting: Internal Medicine

## 2019-10-08 ENCOUNTER — Other Ambulatory Visit: Payer: Self-pay | Admitting: Internal Medicine

## 2019-10-08 DIAGNOSIS — K2100 Gastro-esophageal reflux disease with esophagitis, without bleeding: Secondary | ICD-10-CM

## 2019-10-08 MED ORDER — DEXILANT 60 MG PO CPDR: DELAYED_RELEASE_CAPSULE | ORAL | 1 refills | Status: DC

## 2019-10-08 NOTE — Telephone Encounter (Signed)
Pt called in and is needing a refill on this med.   Pharmacy - [facility] on [address]

## 2019-10-08 NOTE — Telephone Encounter (Signed)
Disregard message , PCP does this medicine for patient.

## 2019-10-09 ENCOUNTER — Encounter: Payer: Self-pay | Admitting: *Deleted

## 2019-10-09 ENCOUNTER — Other Ambulatory Visit: Payer: Self-pay

## 2019-10-09 ENCOUNTER — Encounter (HOSPITAL_COMMUNITY)
Admission: RE | Disposition: A | Discharge status: Home / Self Care | Payer: Self-pay | Source: Home / Self Care | Attending: Internal Medicine

## 2019-10-09 ENCOUNTER — Ambulatory Visit: Payer: 59 | Admitting: Physical Therapy

## 2019-10-09 ENCOUNTER — Ambulatory Visit (HOSPITAL_COMMUNITY)
Admission: RE | Admit: 2019-10-09 | Discharge: 2019-10-09 | Disposition: A | Discharge status: Home / Self Care | Payer: 59 | Attending: Internal Medicine | Admitting: Internal Medicine

## 2019-10-09 DIAGNOSIS — K219 Gastro-esophageal reflux disease without esophagitis: Secondary | ICD-10-CM

## 2019-10-09 DIAGNOSIS — M6281 Muscle weakness (generalized): Secondary | ICD-10-CM

## 2019-10-09 DIAGNOSIS — R12 Heartburn: Secondary | ICD-10-CM | POA: Diagnosis present

## 2019-10-09 DIAGNOSIS — M25561 Pain in right knee: Secondary | ICD-10-CM | POA: Diagnosis not present

## 2019-10-09 DIAGNOSIS — M25562 Pain in left knee: Secondary | ICD-10-CM

## 2019-10-09 DIAGNOSIS — G8929 Other chronic pain: Secondary | ICD-10-CM

## 2019-10-09 HISTORY — PX: PH IMPEDANCE STUDY: SHX5565

## 2019-10-09 HISTORY — PX: ESOPHAGEAL MANOMETRY: SHX5429

## 2019-10-09 SURGERY — MANOMETRY, ESOPHAGUS

## 2019-10-09 MED ORDER — LIDOCAINE VISCOUS HCL 2 % MT SOLN
OROMUCOSAL | Status: AC
  Filled 2019-10-09: qty 15

## 2019-10-09 SURGICAL SUPPLY — 2 items
FACESHIELD LNG OPTICON STERILE (SAFETY) IMPLANT
GLOVE BIO SURGEON STRL SZ8 (GLOVE) ×6 IMPLANT

## 2019-10-09 NOTE — Therapy (Signed)
[facility] [address] [place], [place], [address] Phone: [phone]   Fax:  [phone]  Physical Therapy Treatment  Patient Details  Name: CLEMENS LACHMAN MRN: [id number] Date of Birth: Sep 20, 1986 Referring Provider (PT): Judi Saa, DO   Encounter Date: 10/09/2019  PT End of Session - 10/09/19 1410    Visit Number  2    Number of Visits  8    Date for PT Re-Evaluation  11/20/19    Authorization Type  UHC    PT Start Time  1410    PT Stop Time  1457    PT Time Calculation (min)  47 min    Activity Tolerance  Patient tolerated treatment well    Behavior During Therapy  [facility] for tasks assessed/performed       Past Medical History:  Diagnosis Date  . Fundic gland polyposis of stomach   . GERD (gastroesophageal reflux disease)   . Hemorrhoid   . Sebaceous cyst     Past Surgical History:  Procedure Laterality Date  . ESOPHAGEAL MANOMETRY N/A 10/09/2019   Procedure: ESOPHAGEAL MANOMETRY (EM);  Surgeon: Iva Boop, MD;  Location: [facility];  Service: Endoscopy;  Laterality: N/A;  . ESOPHAGOGASTRODUODENOSCOPY N/A 05/18/2015   Procedure: ESOPHAGOGASTRODUODENOSCOPY (EGD);  Surgeon: Louis Meckel, MD;  Location: [facility];  Service: Endoscopy;  Laterality: N/A;  . PH IMPEDANCE STUDY  10/09/2019   Procedure: PH IMPEDANCE STUDY;  Surgeon: Iva Boop, MD;  Location: [facility];  Service: Endoscopy;;  . WISDOM TOOTH EXTRACTION      There were no vitals filed for this visit.  Subjective Assessment - 10/09/19 1410    Subjective  "I felt like I may have pushed myself too much and felt some soreness  the next day so I backed off"    Patient Stated Goals  Reducepain so he can return to running    Currently in Pain?  Yes    Pain Score  2     Pain Orientation  Right    Pain Descriptors / Indicators  Aching    Pain Type  Chronic pain    Pain Onset  More than a month ago    Pain Frequency  Intermittent     Aggravating Factors   squatting, running                       [facility] Adult PT Treatment/Exercise - 10/09/19 0001      Knee/Hip Exercises: Standing   Forward Step Up  2 sets;Step Height: 8";Both    Step Down  Both;1 set;Step Height: 8";10 reps    Other Standing Knee Exercises  lateral band walks 2 x around table bil with red theraband around ankles      Knee/Hip Exercises: Sidelying   Other Sidelying Knee/Hip Exercises  bil hip external rotation 2 x15      Manual Therapy   Manual Therapy  Taping    Manual therapy comments  MTPR along bil vastus lateralis x 3    McConnell  bil lateral > medial             PT Education - 10/09/19 1500    Education Details  updated HEP    Person(s) Educated  Patient    Methods  Explanation;Verbal cues;Handout    Comprehension  Verbalized understanding;Verbal cues required       PT Short Term Goals - 09/25/19 1525      PT SHORT TERM  GOAL #1   Title  Patient will be independent in initial HEP to maintain progress in PT.    Baseline  HEP given at eval    Time  3    Period  Weeks    Status  New    Target Date  10/16/19      PT SHORT TERM GOAL #2   Title  Patient will report reduced bilateral knee pain to < or = 0-1/10 with work activities.    Baseline  1-2/10 at work    Time  4    Period  Weeks    Status  New    Target Date  10/23/19        PT Long Term Goals - 09/25/19 1528      PT LONG TERM GOAL #1   Title  Patient will exhibit improved strength of gross LE > or = 4+/5 in order to improve dynamic control with single leg squat to reduce knee pain.    Time  8    Period  Weeks    Status  New    Target Date  11/20/19      PT LONG TERM GOAL #2   Title  Patient will report improved functional level of FOTO < or = 23% limitation.    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    Time  8    Period  Weeks    Status  New    Target Date  11/20/19            Plan - 10/09/19 1551    Clinical Impression Statement  pt reported some increased soreness afte rthe last session which he attributes to doing too much at home, and has since backed down some. Worked on trigger point release of bil vastus lateralis and trialed bil McConnel taping which he noted felt good / stable. continued working hip/ knee strneghtneing which he did well but does fatigue quickly. reviewed HEP and discussed importance of being mindful of doing too much to avoid any potential issues.    PT Treatment/Interventions  Cryotherapy;Dentist;Therapeutic activities;Therapeutic exercise;Balance training;Neuromuscular re-education;Patient/family education;Dry needling;Manual techniques;Taping;Joint Manipulations    PT Next Visit Plan  Assess HEP and other exercises he is performing from doctor and in gym, trial patellar taping to assess pain respones, progress gluteal/core/and quad strengthening with focus on avoiding dynamic knee valgus    PT Home Exercise Plan  Clamshell with red band, single leg bridge with contralateral knee extension (hip thrust with resistance at gym); patient is also performing gym based exercises    Consulted and Agree with Plan of Care  Patient       Patient will benefit from skilled therapeutic intervention in order to improve the following deficits and impairments:  Decreased activity tolerance, Pain, Decreased strength, Improper body mechanics, Decreased balance  Visit Diagnosis: Chronic pain of right knee  Chronic  pain of left knee  Muscle weakness (generalized)     Problem List Patient Active Problem List    Diagnosis Date Noted  . Vitamin D insufficiency 03/16/2018  . Essential hypertension 03/15/2018  . Patellofemoral syndrome of both knees 06/09/2017  . Benign hypermobility syndrome 06/09/2017  . Hemorrhoids 12/23/2015  . Routine general medical examination at a health care facility 02/16/2015  . GASTROESOPHAGEAL REFLUX DISEASE 12/29/2007    Lulu RidingKristoffer Zoila Ditullio PT, DPT, LAT, ATC  10/09/19  4:34 PM      [facility] [address] [place], [place], [address] Phone: [phone]   Fax:  [phone]  Name: Ellison HughsShaman R Say MRN: [id number] Date of Birth: March 24, 1986

## 2019-10-09 NOTE — Progress Notes (Signed)
Esophageal Manometry done per protocol. Patient tolerated well without distress or complication. PH with impedance probe place per protocol at 39 cm per manometry readings. Pt instructed using teach back and patient verbalized understanding. Pt will return tomorrow to unit at 0930 or after to have probe removed and monitor download.

## 2019-10-14 ENCOUNTER — Ambulatory Visit: Payer: 59 | Admitting: Physical Therapy

## 2019-10-14 ENCOUNTER — Other Ambulatory Visit: Payer: Self-pay

## 2019-10-14 ENCOUNTER — Encounter: Payer: Self-pay | Admitting: Physical Therapy

## 2019-10-14 DIAGNOSIS — G8929 Other chronic pain: Secondary | ICD-10-CM

## 2019-10-14 DIAGNOSIS — M25561 Pain in right knee: Secondary | ICD-10-CM | POA: Diagnosis not present

## 2019-10-14 DIAGNOSIS — M6281 Muscle weakness (generalized): Secondary | ICD-10-CM

## 2019-10-14 NOTE — Therapy (Signed)
[facility] [address] [place], [place], [address] Phone: [phone]   Fax:  [phone]  Physical Therapy Treatment  Patient Details  Name: Bernard HughsShaman R Beckmann MRN: [id number] Date of Birth: 1986-07-04 Referring Provider (PT): Judi SaaSmith, Zachary M, DO   Encounter Date: 10/14/2019  PT End of Session - 10/14/19 1417    Visit Number  3    Number of Visits  8    Date for PT Re-Evaluation  11/20/19    Authorization Type  UHC    PT Start Time  1416    PT Stop Time  1500    PT Time Calculation (min)  44 min       Past Medical History:  Diagnosis Date  . Fundic gland polyposis of stomach   . GERD (gastroesophageal reflux disease)   . Hemorrhoid   . Sebaceous cyst     Past Surgical History:  Procedure Laterality Date  . ESOPHAGEAL MANOMETRY N/A 10/09/2019   Procedure: ESOPHAGEAL MANOMETRY (EM);  Surgeon: Iva BoopGessner, Carl E, MD;  [facility];  Service: Endoscopy;  Laterality: N/A;  . ESOPHAGOGASTRODUODENOSCOPY N/A 05/18/2015   Procedure: ESOPHAGOGASTRODUODENOSCOPY (EGD);  Surgeon: Louis Meckelobert D Kaplan, MD;  [facility];  Service: Endoscopy;  Laterality: N/A;  . PH IMPEDANCE STUDY  10/09/2019   Procedure: PH IMPEDANCE STUDY;  Surgeon: Iva BoopGessner, Carl E, MD;  [facility]: [facility];  Service: Endoscopy;;  . WISDOM TOOTH EXTRACTION      There were no vitals filed for this visit.  Subjective Assessment - 10/14/19 1419    Subjective  "I think the tape helped alot, I think overall I am feeling better since the last session. I am usure though if it is because not doing my exercises"    Patient Stated Goals  Reducepain so he can return to running    Currently in Pain?  Yes    Pain Score  1     Pain Orientation  Right         [facility] PT Assessment - 10/14/19 0001      Assessment   Medical Diagnosis  Chronic bilateral knee pain    Referring Provider (PT)  Judi SaaSmith, Zachary M, DO                   [facility]  PT Treatment/Exercise - 10/14/19 0001      Knee/Hip Exercises: Aerobic   Nustep  L6 x 4 min LE only      Knee/Hip Exercises: Standing   Step Down  --      Knee/Hip Exercises: Sidelying   Hip ABduction  --   CW/CCW     Manual Therapy   Manual Therapy  Soft tissue mobilization    Manual therapy comments  skilled palpation and monitoring of pt throughout TPDN    Soft tissue mobilization  IASTM along vastus lateralis bil    McConnell  bil lateral > medial   teaching how to apply tape       Trigger Point Dry Needling - 10/14/19 0001    Consent Given?  Yes    Education Handout Provided  Previously provided    Muscles Treated Lower Quadrant  Vastus lateralis    Vastus lateralis Response  Twitch response elicited;Palpable increased muscle length   bil          PT Education - 10/14/19 1433    Education Details  muscle anatomy and referral patterns, What TPDN is benefits and what to expect. how to apply [facility]  tape properly and provied where He can purchase it.    Person(s) Educated  Patient    Methods  Explanation;Verbal cues    Comprehension  Verbalized understanding;Verbal cues required       PT Short Term Goals - 09/25/19 1525      PT SHORT TERM GOAL #1   Title  Patient will be independent in initial HEP to maintain progress in PT.    Baseline  HEP given at eval    Time  3    Period  Weeks    Status  New    Target Date  10/16/19      PT SHORT TERM GOAL #2   Title  Patient will report reduced bilateral knee pain to < or = 0-1/10 with work activities.    Baseline  1-2/10 at work    Time  4    Period  Weeks    Status  New    Target Date  10/23/19        PT Long Term Goals - 09/25/19 1528      PT LONG TERM GOAL #1   Title  Patient will exhibit improved strength of gross LE > or = 4+/5 in order to improve dynamic control with single leg squat to reduce knee pain.    Time  8    Period  Weeks    Status  New    Target Date  11/20/19      PT LONG TERM GOAL  #2   Title  Patient will report improved functional level of FOTO < or = 23% limitation.    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    Time  8    Period  Weeks    Status  New    Target Date  11/20/19            Plan - 10/14/19 1452    Clinical Impression Statement  pt reports feeling better after the last session and reports only intermittent 1/10 pain. educated and consent was provided for TPDN focusing on bil vastus lateralis followed with IASTM techinques and bil McConnel taping. educated how to properly apply McConnel tape, pt states he plans to purchase some tape. end of session he reported decreased pain/ stiffness.    Rehab Potential  Good    PT Treatment/Interventions  Cryotherapy;Dentist;Therapeutic activities;Therapeutic exercise;Balance training;Neuromuscular re-education;Patient/family education;Dry needling;Manual techniques;Taping;Joint Manipulations    PT Next Visit Plan  update HEP PRN, progress gluteal/core/and quad strengthening with focus on avoiding dynamic knee valgus, response to TPDN, did he get tape to do at home?    PT Home Exercise Plan  Clamshell with red band, single leg bridge with contralateral knee extension (hip thrust with resistance at gym); patient is also performing gym based exercises    Consulted and Agree with Plan of Care  Patient       Patient will  benefit from skilled therapeutic intervention in order to improve the following deficits and impairments:  Decreased activity tolerance, Pain, Decreased strength, Improper body mechanics, Decreased  balance  Visit Diagnosis: Chronic pain of right knee  Chronic pain of left knee  Muscle weakness (generalized)     Problem List Patient Active Problem List   Diagnosis Date Noted  . Vitamin D insufficiency 03/16/2018  . Essential hypertension 03/15/2018  . Patellofemoral syndrome of both knees 06/09/2017  . Benign hypermobility syndrome 06/09/2017  . Hemorrhoids 12/23/2015  . Routine general medical examination at a health care facility 02/16/2015  . GASTROESOPHAGEAL REFLUX DISEASE 12/29/2007   Lulu Riding PT, DPT, LAT, ATC  10/14/19  3:45 PM      [facility] [address] [place], [place], [address] Phone: [phone]   Fax:  [phone]  Name: LARK RUNK MRN: [id number] Date of Birth: 1986/09/19

## 2019-10-20 ENCOUNTER — Other Ambulatory Visit: Payer: Self-pay | Admitting: Internal Medicine

## 2019-10-21 ENCOUNTER — Ambulatory Visit: Payer: 59 | Admitting: Physical Therapy

## 2019-10-21 ENCOUNTER — Other Ambulatory Visit: Payer: Self-pay

## 2019-10-21 ENCOUNTER — Encounter: Payer: Self-pay | Admitting: Physical Therapy

## 2019-10-21 DIAGNOSIS — G8929 Other chronic pain: Secondary | ICD-10-CM

## 2019-10-21 DIAGNOSIS — M25562 Pain in left knee: Secondary | ICD-10-CM

## 2019-10-21 DIAGNOSIS — M6281 Muscle weakness (generalized): Secondary | ICD-10-CM

## 2019-10-21 DIAGNOSIS — M25561 Pain in right knee: Secondary | ICD-10-CM | POA: Diagnosis not present

## 2019-10-21 NOTE — Therapy (Signed)
[facility] [address] [place], [place], [address] Phone: [phone]   Fax:  [phone]  Physical Therapy Treatment  Patient Details  Name: Bernard [name] MRN: [id number] Date of Birth: March 30, 1986 Referring Provider (PT): Judi Saa, DO   Encounter Date: 10/21/2019  PT End of Session - 10/21/19 1137    Visit Number  4    Number of Visits  8    Date for PT Re-Evaluation  11/20/19    Authorization Type  UHC    PT Start Time  1132    PT Stop Time  1214    PT Time Calculation (min)  42 min    Activity Tolerance  Patient tolerated treatment well    Behavior During Therapy  [facility] for tasks assessed/performed       Past Medical History:  Diagnosis Date  . Fundic gland polyposis of stomach   . GERD (gastroesophageal reflux disease)   . Hemorrhoid   . Sebaceous cyst     Past Surgical History:  Procedure Laterality Date  . ESOPHAGEAL MANOMETRY N/A 10/09/2019   Procedure: ESOPHAGEAL MANOMETRY (EM);  Surgeon: Iva Boop, MD;  [facility]: [facility];  Service: Endoscopy;  Laterality: N/A;  . ESOPHAGOGASTRODUODENOSCOPY N/A 05/18/2015   Procedure: ESOPHAGOGASTRODUODENOSCOPY (EGD);  Surgeon: Louis Meckel, MD;  [facility];  Service: Endoscopy;  Laterality: N/A;  . PH IMPEDANCE STUDY  10/09/2019   Procedure: PH IMPEDANCE STUDY;  Surgeon: Iva Boop, MD;  [facility];  Service: Endoscopy;;  . WISDOM TOOTH EXTRACTION      There were no vitals filed for this visit.  Subjective Assessment - 10/21/19 1135    Subjective  Patient reports he is feeling better, about 1/10 pain level after exercise and only slight feelings of instability.    Currently in Pain?  Yes    Pain Score  1     Pain Location  Knee    Pain Orientation  Right    Pain Descriptors / Indicators  Aching    Pain Type  Chronic pain    Pain Onset  More than a month ago    Pain Frequency  Intermittent         [facility] PT  Assessment - 10/21/19 0001      Single Leg Squat   Comments  Patient continues to exhibit dynamic knee valgis with single leg squat                   [facility] Adult PT Treatment/Exercise - 10/21/19 0001      Exercises   Exercises  Knee/Hip      Knee/Hip Exercises: Stretches   Other Knee/Hip Stretches  Patient instructed in and demonstrated foam rolling for quads, ITB, and hamstrings      Knee/Hip Exercises: Aerobic   Elliptical  L1 x5 min      Knee/Hip Exercises: Standing   Other Standing Knee Exercises  Hydrant with red band 2x10 each    Other Standing Knee Exercises  Dead lift 45# 3x10      Knee/Hip Exercises: Supine   Bridges  2 sets;10 reps    Bridges Limitations  black band around knees, feet closer together             PT Education - 10/21/19 1136    Education Details  HEP    Person(s) Educated  Patient    Methods  Explanation;Demonstration;Verbal cues;Handout    Comprehension  Verbalized understanding;Returned demonstration;Tactile cues required;Need further instruction  PT Short Term Goals - 10/21/19 1140      PT SHORT TERM GOAL #1   Title  Patient will be independent in initial HEP to maintain progress in PT.    Baseline  HEP given at eval    Time  3    Period  Weeks    Status  Achieved    Target Date  10/16/19      PT SHORT TERM GOAL #2   Title  Patient will report reduced bilateral knee pain to < or = 0-1/10 with work activities.    Baseline  patient reports 1/10    Time  4    Period  Weeks    Status  Achieved    Target Date  10/23/19        PT Long Term Goals - 10/21/19 1142      PT LONG TERM GOAL #1   Title  Patient will exhibit improved strength of gross LE > or = 4+/5 in order to improve dynamic control with single leg squat to reduce knee pain.    Time  8    Period  Weeks    Status  New    Target Date  11/20/19      PT LONG TERM GOAL #2   Title  Patient will report improved functional level of FOTO < or = 23%  limitation.    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    Time  8    Period  Weeks    Status  New    Target Date  11/20/19            Plan - 10/21/19 1139    Clinical Impression Statement  Patient is progressing well with his strengthening and seems to be improving with the use of patellar taping. He reported a mild sensation with elliptical but no other pain with therapy. He was instructed in new LE strengthening exercises and foam rolling to reduce pain. He required cueing to maintain proper knee alignment and hip hinge form. He would benefit from continued skilled PT to progress strength and control so he can return to running.    PT Treatment/Interventions  Cryotherapy;Dentist;Therapeutic activities;Therapeutic exercise;Balance training;Neuromuscular re-education;Patient/family education;Dry needling;Manual techniques;Taping;Joint Manipulations    PT Next Visit Plan  update HEP PRN, progress gluteal/core/and quad strengthening with focus on avoiding dynamic knee valgus    PT Home Exercise Plan  Clamshell with red band, bridge with black band (hip thrust with resistance at gym), standing hydrant with red band, SLR; patient is also performing gym based exercises    Consulted and Agree with Plan of Care  Patient       Patient will benefit from skilled therapeutic intervention in order to improve the following deficits and impairments:  Decreased activity tolerance, Pain, Decreased strength, Improper body mechanics, Decreased balance  Visit Diagnosis: Chronic pain of right knee  Chronic  pain of left knee  Muscle weakness (generalized)     Problem List Patient Active Problem List   Diagnosis Date Noted  . Vitamin D insufficiency 03/16/2018  . Essential hypertension 03/15/2018  . Patellofemoral syndrome of both knees 06/09/2017  . Benign hypermobility syndrome 06/09/2017  . Hemorrhoids 12/23/2015  . Routine general medical examination at a health care facility 02/16/2015  . GASTROESOPHAGEAL REFLUX DISEASE 12/29/2007    Rosana Hoesampbell Adom Schoeneck, PT, DPT, LAT, ATC 10/21/19  1:37 PM Phone: [phone] Fax: [phone]   [facility] [address] [place], [place], [address] Phone: [phone]   Fax:  [phone]  Name: Bernard [name] MRN: [id number] Date of Birth: 1986/09/08

## 2019-10-23 ENCOUNTER — Ambulatory Visit: Payer: 59 | Admitting: Family Medicine

## 2019-10-24 DIAGNOSIS — R12 Heartburn: Secondary | ICD-10-CM

## 2019-10-29 ENCOUNTER — Telehealth: Payer: Self-pay | Admitting: Internal Medicine

## 2019-10-29 DIAGNOSIS — R12 Heartburn: Secondary | ICD-10-CM

## 2019-10-29 DIAGNOSIS — Z79899 Other long term (current) drug therapy: Secondary | ICD-10-CM

## 2019-10-29 NOTE — Telephone Encounter (Signed)
Reviewed that manometry and pH/impedance tests were not consistent with reflux  We discussed treatment options and will try TCA (amitriptykine)    We will have him get an EKG first - dx long-term use high-risk medication

## 2019-10-30 NOTE — Telephone Encounter (Signed)
I spoke with Cardiology and they do not perform EKG for non-cardiology patient.  Should I direct him back to his PCP to have this done?

## 2019-11-01 NOTE — Telephone Encounter (Signed)
See other notes [facility] is doing it

## 2019-11-06 ENCOUNTER — Encounter: Payer: Self-pay | Admitting: Internal Medicine

## 2019-11-06 ENCOUNTER — Other Ambulatory Visit: Payer: Self-pay

## 2019-11-06 ENCOUNTER — Ambulatory Visit (INDEPENDENT_AMBULATORY_CARE_PROVIDER_SITE_OTHER): Payer: 59 | Admitting: Internal Medicine

## 2019-11-06 VITALS — BP 132/80 | HR 73 | Temp 98.4°F | Resp 16 | Ht 69.0 in | Wt 168.0 lb

## 2019-11-06 DIAGNOSIS — I10 Essential (primary) hypertension: Secondary | ICD-10-CM | POA: Diagnosis not present

## 2019-11-06 NOTE — Progress Notes (Signed)
Subjective:  Patient ID: Bernard [name], male    DOB: 1985-11-02  Age: 34 y.o. MRN: [id number]  CC: Hypertension  This visit occurred during the SARS-CoV-2 public health emergency.  Safety protocols were in place, including screening questions prior to the visit, additional usage of staff PPE, and extensive cleaning of exam room while observing appropriate contact time as indicated for disinfecting solutions.    HPI Bernard [name] presents for a BP check - He tells me his blood pressure has been well controlled.  He denies headache, blurred vision, chest pain, shortness of breath, palpitations, edema, or fatigue.  Outpatient Medications Prior to Visit  Medication Sig Dispense Refill  . cetirizine (ZYRTEC) 10 MG tablet Take 10 mg by mouth every morning.     . Cholecalciferol (VITAMIN D) 50 MCG (2000 UT) CAPS Take 1 capsule by mouth daily.    Marland Kitchen dexlansoprazole (DEXILANT) 60 MG capsule TAKE 1 CAPSULE BY MOUTH  DAILY BEFORE BREAKFAST 90 capsule 1  . famotidine (PEPCID) 20 MG tablet TAKE 1 TABLET(20 MG) BY MOUTH TWICE DAILY 180 tablet 0  . Multiple Vitamin (MULTIVITAMIN WITH MINERALS) TABS tablet Take 1 tablet by mouth daily.    . sucralfate (CARAFATE) 1 g tablet Take 1 tablet (1 g total) by mouth 4 (four) times daily -  with meals and at bedtime. 120 tablet 2   No facility-administered medications prior to visit.    ROS Review of Systems  Constitutional: Negative for diaphoresis and fatigue.  HENT: Negative.   Eyes: Negative for visual disturbance.  Respiratory: Negative for cough, chest tightness and wheezing.   Cardiovascular: Negative for chest pain, palpitations and leg swelling.  Gastrointestinal: Negative for abdominal pain, constipation, diarrhea, nausea and vomiting.  Endocrine: Negative.   Musculoskeletal: Negative.  Negative for neck pain.  Skin: Negative.  Negative for color change.  Neurological: Negative for dizziness, weakness and headaches.  Hematological: Negative  for adenopathy. Does not bruise/bleed easily.  Psychiatric/Behavioral: Negative.     Objective:  BP 132/80 (BP Location: Left Arm, Patient Position: Sitting, Cuff Size: Normal)   Pulse 73   Temp 98.4 F (36.9 C) (Oral)   Resp 16   Ht 5\' 9"  (1.753 m)   Wt 168 lb (76.2 kg)   SpO2 97%   BMI 24.81 kg/m   BP Readings from Last 3 Encounters:  11/06/19 132/80  09/25/19 100/70  09/16/19 118/76    Wt Readings from Last 3 Encounters:  11/06/19 168 lb (76.2 kg)  09/16/19 162 lb (73.5 kg)  09/03/19 167 lb (75.8 kg)    Physical Exam Vitals reviewed.  HENT:     Nose: Nose normal.     Mouth/Throat:     Mouth: Mucous membranes are moist.  Eyes:     General: No scleral icterus.    Conjunctiva/sclera: Conjunctivae normal.  Cardiovascular:     Rate and Rhythm: Normal rate and regular rhythm.     Heart sounds: No murmur.     Comments: EKG ---  NSR No LVH Nl EKG Pulmonary:     Effort: Pulmonary effort is normal.     Breath sounds: No stridor. No wheezing, rhonchi or rales.  Abdominal:     General: Abdomen is flat. Bowel sounds are normal.     Palpations: There is no hepatomegaly, splenomegaly or mass.     Tenderness: There is no abdominal tenderness.  Musculoskeletal:        General: Normal range of motion.     Cervical back: Neck  supple.     Right lower leg: No edema.     Left lower leg: No edema.  Lymphadenopathy:     Cervical: No cervical adenopathy.  Skin:    General: Skin is warm and dry.  Neurological:     General: No focal deficit present.     Mental Status: He is alert.  Psychiatric:        Mood and Affect: Mood normal.        Behavior: Behavior normal.     Lab Results  Component Value Date   WBC 5.4 09/17/2019   HGB 15.3 09/17/2019   HCT 44.9 09/17/2019   PLT 258.0 09/17/2019   GLUCOSE 97 08/13/2019   CHOL 137 09/17/2019   TRIG 115.0 09/17/2019   HDL 47.60 09/17/2019   LDLCALC 66 09/17/2019   ALT 29 08/13/2019   AST 27 08/13/2019   NA 139  08/13/2019   K 3.7 08/13/2019   CL 101 08/13/2019   CREATININE 0.98 08/13/2019   BUN 16 08/13/2019   CO2 30 08/13/2019   TSH 1.27 08/13/2019   HGBA1C 5.6 02/28/2017    No results found.  Assessment & Plan:   Bernard [name] was seen today for hypertension.  Diagnoses and all orders for this visit:  Essential hypertension - His blood pressure is adequately well controlled with lifestyle modifications.  EKG is negative for LVH.  Recent labs are negative for secondary causes or endorgan damage.  Antihypertensive therapy is not indicated at this time. -     EKG 12-Lead   I am having Bernard [name] maintain his cetirizine, multivitamin with minerals, Vitamin D, sucralfate, Dexilant, and famotidine.  No orders of the defined types were placed in this encounter.    Follow-up: No follow-ups on file.  Scarlette Calico, MD

## 2019-11-07 ENCOUNTER — Ambulatory Visit: Payer: 59 | Attending: Family Medicine | Admitting: Physical Therapy

## 2019-11-07 ENCOUNTER — Encounter: Payer: Self-pay | Admitting: Physical Therapy

## 2019-11-07 ENCOUNTER — Other Ambulatory Visit: Payer: Self-pay

## 2019-11-07 ENCOUNTER — Encounter: Payer: Self-pay | Admitting: Internal Medicine

## 2019-11-07 DIAGNOSIS — M25562 Pain in left knee: Secondary | ICD-10-CM | POA: Insufficient documentation

## 2019-11-07 DIAGNOSIS — M6281 Muscle weakness (generalized): Secondary | ICD-10-CM

## 2019-11-07 DIAGNOSIS — G8929 Other chronic pain: Secondary | ICD-10-CM | POA: Insufficient documentation

## 2019-11-07 DIAGNOSIS — M25561 Pain in right knee: Secondary | ICD-10-CM | POA: Insufficient documentation

## 2019-11-07 NOTE — Therapy (Signed)
[facility] [address] [place], [place], [address] Phone: [phone]   Fax:  [phone]  Physical Therapy Treatment  Patient Details  Name: MARKEITH JUE MRN: [id number] Date of Birth: Oct 01, 1986 Referring Provider (PT): Judi Saa, DO   Encounter Date: 11/07/2019  PT End of Session - 11/07/19 1135    Visit Number  5    Number of Visits  8    Date for PT Re-Evaluation  11/20/19    Authorization Type  [employer]    PT Start Time  1132    PT Stop Time  1215    PT Time Calculation (min)  43 min    Activity Tolerance  Patient tolerated treatment well    Behavior During Therapy  [facility] for tasks assessed/performed       Past Medical History:  Diagnosis Date  . Fundic gland polyposis of stomach   . GERD (gastroesophageal reflux disease)   . Hemorrhoid   . Sebaceous cyst     Past Surgical History:  Procedure Laterality Date  . ESOPHAGEAL MANOMETRY N/A 10/09/2019   Procedure: ESOPHAGEAL MANOMETRY (EM);  Surgeon: Iva Boop, MD;  Location: [facility];  Service: Endoscopy;  Laterality: N/A;  . ESOPHAGOGASTRODUODENOSCOPY N/A 05/18/2015   Procedure: ESOPHAGOGASTRODUODENOSCOPY (EGD);  Surgeon: Louis Meckel, MD;  Location: [facility];  Service: Endoscopy;  Laterality: N/A;  . PH IMPEDANCE STUDY  10/09/2019   Procedure: PH IMPEDANCE STUDY;  Surgeon: Iva Boop, MD;  Location: [facility];  Service: Endoscopy;;  . WISDOM TOOTH EXTRACTION      There were no vitals filed for this visit.  Subjective Assessment - 11/07/19 1134    Subjective  Patient reports he only has 1/10 pain level and that is only when he is exerting himself. Pain is still bilateral. He stopped taping about a week ago and he has been doing well.    Currently in Pain?  Yes    Pain Score  1     Pain Location  Knee    Pain Orientation  Right;Left    Pain Descriptors / Indicators  Aching    Pain Type  Chronic pain    Pain Onset  More than a  month ago    Pain Frequency  Intermittent         [facility] PT Assessment - 11/07/19 0001      Assessment   Medical Diagnosis  Chronic bilateral knee pain    Referring Provider (PT)  Judi Saa, DO      Single Leg Squat   Comments  Patient continues to exhibit mild dynamic knee valgus and impaired balance with single leg tasks                   [facility] Adult PT Treatment/Exercise - 11/07/19 0001      Exercises   Exercises  Knee/Hip   Reviewed and modified HEP, foam rolling     Knee/Hip Exercises: Aerobic   Elliptical  L5 with incline 5 x5 min      Knee/Hip Exercises: Standing   Forward Lunges Limitations  Reverse lunge - VC for technique, knee alignment    Other Standing Knee Exercises  SL RDL with 15# kettlebell - VC for technique, using surface for balance    Other Standing Knee Exercises  Dead lift 45# with blue band      Knee/Hip Exercises: Sidelying   Other Sidelying Knee/Hip Exercises  Side plank clamshell with red band  PT Education - 11/07/19 1135    Education Details  HEP, methods to make exercises more challenging, dry needling    Person(s) Educated  Patient    Methods  Explanation;Demonstration;Verbal cues;Handout    Comprehension  Verbalized understanding;Returned demonstration;Verbal cues required;Need further instruction       PT Short Term Goals - 10/21/19 1140      PT SHORT TERM GOAL #1   Title  Patient will be independent in initial HEP to maintain progress in PT.    Baseline  HEP given at eval    Time  3    Period  Weeks    Status  Achieved    Target Date  10/16/19      PT SHORT TERM GOAL #2   Title  Patient will report reduced bilateral knee pain to < or = 0-1/10 with work activities.    Baseline  patient reports 1/10    Time  4    Period  Weeks    Status  Achieved    Target Date  10/23/19        PT Long Term Goals - 10/21/19 1142      PT LONG TERM GOAL #1   Title  Patient will exhibit improved strength  of gross LE > or = 4+/5 in order to improve dynamic control with single leg squat to reduce knee pain.    Time  8    Period  Weeks    Status  New    Target Date  11/20/19      PT LONG TERM GOAL #2   Title  Patient will report improved functional level of FOTO < or = 23% limitation.    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    Time  8    Period  Weeks    Status  New    Target Date  11/20/19            Plan - 11/07/19 1208    Clinical Impression Statement  Patient is progressing well with his strengthening exercises and seems to be improving with knee pain only occuring during exertion and with less overall. He continues to require cueing for technique with exercises to reduce stress over anterior knee and with exercise progression. He did state this visit that he feels dry needling really benefited him so this will be incorporated more in future sessions. He would benefit from continued skilled PT to progress dynamic control to reduce pain with activity.    PT Treatment/Interventions  Cryotherapy;Publishing copy;Therapeutic activities;Therapeutic exercise;Balance training;Neuromuscular re-education;Patient/family education;Dry needling;Manual techniques;Taping;Joint Manipulations    PT Next Visit Plan  update HEP PRN, progress gluteal/core/and quad strengthening with focus on avoiding dynamic knee valgus, single leg tasks, dry needling    PT Home Exercise Plan  [email or web address]    Consulted and Agree with Plan of Care  Patient  Patient will benefit from skilled therapeutic intervention in order to  improve the following deficits and impairments:  Decreased activity tolerance, Pain, Decreased strength, Improper body mechanics, Decreased balance  Visit Diagnosis: Chronic pain of right knee  Chronic pain of left knee  Muscle weakness (generalized)     Problem List Patient Active Problem List   Diagnosis Date Noted  . Functional heartburn   . Vitamin D insufficiency 03/16/2018  . Essential hypertension 03/15/2018  . Patellofemoral syndrome of both knees 06/09/2017  . Benign hypermobility syndrome 06/09/2017  . Hemorrhoids 12/23/2015  . Routine general medical examination at a health care facility 02/16/2015  . GASTROESOPHAGEAL REFLUX DISEASE 12/29/2007    Hilda Blades, PT, DPT, LAT, ATC 11/07/19  2:24 PM Phone: [phone] Fax: [place] [facility] [address] [place], [place], [address] Phone: [phone]   Fax:  [phone]  Name: CHAIM GATLEY MRN: [id number] Date of Birth: [birth year]

## 2019-11-07 NOTE — Patient Instructions (Signed)

## 2019-11-07 NOTE — Patient Instructions (Signed)
Access Code: [email or web address]  URL: [email or web address]  Date: 11/07/2019  Prepared by: Rosana Hoes   Exercises Side Plank with Clam and Resistance Kettlebell Deadlift Standing Clam with Resistance Loop Side Stepping with Resistance at Feet Single Leg Deadlift with Kettlebell Reverse Lunge Supine Straight Leg Hip Adduction and Quad Set with Ball Sidelying Hip External Rotation on Table

## 2019-11-15 ENCOUNTER — Other Ambulatory Visit: Payer: Self-pay

## 2019-11-15 ENCOUNTER — Encounter: Payer: Self-pay | Admitting: Physical Therapy

## 2019-11-15 ENCOUNTER — Ambulatory Visit: Payer: 59 | Admitting: Physical Therapy

## 2019-11-15 ENCOUNTER — Other Ambulatory Visit: Payer: Self-pay | Admitting: Internal Medicine

## 2019-11-15 DIAGNOSIS — M25561 Pain in right knee: Secondary | ICD-10-CM | POA: Diagnosis not present

## 2019-11-15 DIAGNOSIS — M25562 Pain in left knee: Secondary | ICD-10-CM

## 2019-11-15 DIAGNOSIS — G8929 Other chronic pain: Secondary | ICD-10-CM

## 2019-11-15 DIAGNOSIS — M6281 Muscle weakness (generalized): Secondary | ICD-10-CM

## 2019-11-15 MED ORDER — AMITRIPTYLINE HCL 25 MG PO TABS: 25.0000 mg | ORAL_TABLET | Freq: Every day | ORAL | 0 refills | Status: DC

## 2019-11-15 NOTE — Therapy (Signed)
[facility] [address] [place], [place], [address] Phone: [phone]   Fax:  [phone]  Physical Therapy Treatment  Patient Details  Name: Bernard [name] MRN: [id number] Date of Birth: June 20, 1986 Referring Provider (PT): Judi Saa, DO   Encounter Date: 11/15/2019  PT End of Session - 11/15/19 1132    Visit Number  6    Number of Visits  8    Date for PT Re-Evaluation  11/20/19    Authorization Type  [employer]HC    PT Start Time  1132    PT Stop Time  1215    PT Time Calculation (min)  43 min    Activity Tolerance  Patient tolerated treatment well    Behavior During Therapy  [facility] for tasks assessed/performed       Past Medical History:  Diagnosis Date  . Fundic gland polyposis of stomach   . GERD (gastroesophageal reflux disease)   . Hemorrhoid   . Sebaceous cyst     Past Surgical History:  Procedure Laterality Date  . ESOPHAGEAL MANOMETRY N/A 10/09/2019   Procedure: ESOPHAGEAL MANOMETRY (EM);  Surgeon: Iva Boop, MD;  Location: [facility];  Service: Endoscopy;  Laterality: N/A;  . ESOPHAGOGASTRODUODENOSCOPY N/A 05/18/2015   Procedure: ESOPHAGOGASTRODUODENOSCOPY (EGD);  Surgeon: Louis Meckel, MD;  Location: [facility];  Service: Endoscopy;  Laterality: N/A;  . PH IMPEDANCE STUDY  10/09/2019   Procedure: PH IMPEDANCE STUDY;  Surgeon: Iva Boop, MD;  Location: [facility];  Service: Endoscopy;;  . WISDOM TOOTH EXTRACTION      There were no vitals filed for this visit.  Subjective Assessment - 11/15/19 1131    Subjective  Patient reports he is having a little more pain this visit because of a hard workout. He did not get above a 4/10. Pain is still bilateral. He would like to do some more dry needling.    Currently in Pain?  Yes    Pain Score  3     Pain Location  Knee    Pain Orientation  Right;Left    Pain Descriptors / Indicators  Aching    Pain Type  Chronic pain    Pain Onset  More than  a month ago    Pain Frequency  Intermittent                       [facility] Adult PT Treatment/Exercise - 11/15/19 0001      Exercises   Exercises  Knee/Hip   review and discussion of HEP and progression     Knee/Hip Exercises: Aerobic   Elliptical  L2 with incline 2 x5 min      Knee/Hip Exercises: Standing   Other Standing Knee Exercises  Rear foot elevated split squat       Knee/Hip Exercises: Sidelying   Other Sidelying Knee/Hip Exercises  Side plank clamshell with red band      Manual Therapy   Manual Therapy  Soft tissue mobilization    Manual therapy comments  skilled palpation and monitoring of pt throughout TPDN    Soft tissue mobilization  IASTM along vastus lateralis bil       Trigger Point Dry Needling - 11/15/19 0001    Consent Given?  Yes    Education Handout Provided  Previously provided    Muscles Treated Lower Quadrant  Vastus lateralis   bilateral   Vastus lateralis Response  Twitch response elicited;Palpable increased muscle length  PT Education - 11/15/19 1132    Education Details  HEP    Person(s) Educated  Patient    Methods  Explanation;Demonstration;Verbal cues;Handout    Comprehension  Verbalized understanding;Returned demonstration;Verbal cues required;Need further instruction       PT Short Term Goals - 10/21/19 1140      PT SHORT TERM GOAL #1   Title  Patient will be independent in initial HEP to maintain progress in PT.    Baseline  HEP given at eval    Time  3    Period  Weeks    Status  Achieved    Target Date  10/16/19      PT SHORT TERM GOAL #2   Title  Patient will report reduced bilateral knee pain to < or = 0-1/10 with work activities.    Baseline  patient reports 1/10    Time  4    Period  Weeks    Status  Achieved    Target Date  10/23/19        PT Long Term Goals - 10/21/19 1142      PT LONG TERM GOAL #1   Title  Patient will exhibit improved strength of gross LE > or = 4+/5 in order to  improve dynamic control with single leg squat to reduce knee pain.    Time  8    Period  Weeks    Status  New    Target Date  11/20/19      PT LONG TERM GOAL #2   Title  Patient will report improved functional level of FOTO < or = 23% limitation.    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    Time  8    Period  Weeks    Status  New    Target Date  11/20/19            Plan - 11/15/19 1132    Clinical Impression Statement  Patient is continuing to do well with hi strengthening exercises. He believes lunges are what cause his pain so these were d/c'd and he is going to trial rear foot elevated split squats to continue working on quad strengthening. He would benefit from continued skilled PT to progress his strength and reduce knee pain with activity.    PT Treatment/Interventions  Cryotherapy;Dentist;Therapeutic activities;Therapeutic exercise;Balance training;Neuromuscular re-education;Patient/family education;Dry needling;Manual techniques;Taping;Joint Manipulations    PT Next Visit Plan  update HEP PRN, progress gluteal/core/and quad strengthening with focus on avoiding dynamic knee valgus, single leg tasks, dry needling    PT Home Exercise Plan  [email or web address]    Consulted and Agree with Plan of Care  Patient       Patient will benefit from skilled therapeutic intervention in order to improve the following deficits and impairments:  Decreased activity tolerance, Pain, Decreased strength, Improper body mechanics, Decreased balance  Visit Diagnosis: Chronic pain  of right knee  Chronic pain  of left knee  Muscle weakness (generalized)     Problem List Patient Active Problem List   Diagnosis Date Noted  . Functional heartburn   . Vitamin D insufficiency 03/16/2018  . Essential hypertension 03/15/2018  . Patellofemoral syndrome of both knees 06/09/2017  . Benign hypermobility syndrome 06/09/2017  . Hemorrhoids 12/23/2015  . Routine general medical examination at a health care facility 02/16/2015  . GASTROESOPHAGEAL REFLUX DISEASE 12/29/2007    Rosana Hoes, PT, DPT, LAT, ATC 11/15/19  12:17 PM Phone: [phone] Fax: [phone]   [facility] [address] [place], [place], [address] Phone: [phone]   Fax:  [phone]  Name: Bernard [name] MRN: [id number] Date of Birth: 1986/03/15

## 2019-11-15 NOTE — Patient Instructions (Signed)
Access Code: [email or web address]  URL: [email or web address]  Date: 11/15/2019  Prepared by: Rosana Hoes   Exercises Side Plank with Clam and Resistance Kettlebell Deadlift Standing Clam with Resistance Loop Side Stepping with Resistance at Feet Single Leg Deadlift with Kettlebell Split Lunge with Arms Forward Supine Straight Leg Hip Adduction and Quad Set with Ball Sidelying Hip External Rotation on Table

## 2019-11-20 ENCOUNTER — Ambulatory Visit: Payer: 59 | Admitting: Physical Therapy

## 2019-11-25 ENCOUNTER — Ambulatory Visit: Payer: 59 | Admitting: Family Medicine

## 2019-11-26 ENCOUNTER — Encounter: Payer: Self-pay | Admitting: Physical Therapy

## 2019-11-26 ENCOUNTER — Other Ambulatory Visit: Payer: Self-pay

## 2019-11-26 ENCOUNTER — Ambulatory Visit: Payer: 59 | Attending: Family Medicine | Admitting: Physical Therapy

## 2019-11-26 DIAGNOSIS — M25561 Pain in right knee: Secondary | ICD-10-CM | POA: Diagnosis present

## 2019-11-26 DIAGNOSIS — M6281 Muscle weakness (generalized): Secondary | ICD-10-CM | POA: Insufficient documentation

## 2019-11-26 DIAGNOSIS — G8929 Other chronic pain: Secondary | ICD-10-CM | POA: Diagnosis present

## 2019-11-26 DIAGNOSIS — M25562 Pain in left knee: Secondary | ICD-10-CM | POA: Diagnosis present

## 2019-11-26 NOTE — Therapy (Signed)
[place], [place], [address] Phone: [phone]   Fax:  [phone]  Physical Therapy Treatment / Re-certification  Patient Details  Name: Bernard [name] MRN: [id number] Date of Birth: [birth year] Referring Provider (PT): Lyndal Pulley, DO   Encounter Date: 11/26/2019  PT End of Session - 11/26/19 1104    Visit Number  7    Number of Visits  8    Date for PT Re-Evaluation  12/04/19    Authorization Type  UHC    PT Start Time  1100    PT Stop Time  1145    PT Time Calculation (min)  45 min    Activity Tolerance  Patient tolerated treatment well    Behavior During Therapy  [facility] for tasks assessed/performed       Past Medical History:  Diagnosis Date  . Fundic gland polyposis of stomach   . GERD (gastroesophageal reflux disease)   . Hemorrhoid   . Sebaceous cyst     Past Surgical History:  Procedure Laterality Date  . ESOPHAGEAL MANOMETRY N/A 10/09/2019   Procedure: ESOPHAGEAL MANOMETRY (EM);  Surgeon: [name], MD;  Location: [facility];  Service: Endoscopy;  Laterality: N/A;  . ESOPHAGOGASTRODUODENOSCOPY N/A 05/18/2015   Procedure: ESOPHAGOGASTRODUODENOSCOPY (EGD);  Surgeon: Inda Castle, MD;  Location: [facility];  Service: Endoscopy;  Laterality: N/A;  . [place] IMPEDANCE STUDY  10/09/2019   Procedure: [place] IMPEDANCE STUDY;  Surgeon: [name], MD;  Location: [facility];  Service: Endoscopy;;  . WISDOM TOOTH EXTRACTION      There were no vitals filed for this visit.  Subjective Assessment - 11/26/19 1105    Subjective  " I think the knees have been getting better but still feel like I am taking 2 steps forward and 1 back. I have been doing alot of strengthening, I do feel like I get some instability but more in the morning"    Currently in Pain?  Yes    Pain Score  1     Pain Orientation  Right;Left    Pain Descriptors / Indicators  Aching    Pain Type  Chronic pain     Pain Onset  More than a month ago    Pain Frequency  Occasional    Aggravating Factors   squatting, running         [facility] PT Assessment - 11/26/19 0001      Assessment   Medical Diagnosis  Chronic bilateral knee pain    Referring Provider (PT)  Lyndal Pulley, DO      Strength   Right Hip Flexion  5/5    Right Hip Extension  4+/5    Right Hip External Rotation   5/5    Right Hip Internal Rotation  5/5    Right Hip ABduction  4/5    Left Hip Flexion  5/5    Left Hip Extension  4+/5    Left Hip External Rotation  5/5    Left Hip Internal Rotation  5/5    Left Hip ABduction  4/5                   [facility] Adult PT Treatment/Exercise - 11/26/19 0001      Knee/Hip Exercises: Standing   Forward Lunges Limitations  2 x 10 lunge with green band pulling medial on the lead leg      Knee/Hip Exercises: Sidelying   Hip ABduction  Strengthening;2 sets;15  reps;Both   oscillations at mid range, and CW/CCW circles     Manual Therapy   Manual therapy comments  skilled palpation and monitoring of pt throughout TPDN    Soft tissue mobilization  IASTM along vastus lateralis bil       Trigger Point Dry Needling - 11/26/19 0001    Consent Given?  Yes    Education Handout Provided  Previously provided    Muscles Treated Lower Quadrant  Gastrocnemius    Vastus lateralis Response  Twitch response elicited;Palpable increased muscle length   bil   Gastrocnemius Response  Twitch response elicited;Palpable increased muscle length   R side only          PT Education - 11/26/19 1244    Education Details  reviewed HEP and discussed approrpriate aerobic exercise utiling low impact such as swimming or elliptical/ arc trainer. updated HEP today and discussed POC next session being last session.    Person(s) Educated  Patient    Methods  Explanation;Verbal cues;Handout    Comprehension  Verbalized understanding;Verbal cues required       PT Short Term Goals - 11/26/19 1111       PT SHORT TERM GOAL #1   Title  Patient will be independent in initial HEP to maintain progress in PT.    Period  Weeks      PT SHORT TERM GOAL #2   Title  Patient will report reduced bilateral knee pain to < or = 0-1/10 with work activities.    Period  Weeks    Status  Achieved        PT Long Term Goals - 11/26/19 1112      PT LONG TERM GOAL #1   Title  Patient will exhibit improved strength of gross LE > or = 4+/5 in order to improve dynamic control with single leg squat to reduce knee pain.    Baseline  4/5 with bil hip abduction    Period  Weeks    Status  Partially Met      PT LONG TERM GOAL #2   Title  Patient will report improved functional level of FOTO < or = 23% limitation.    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    Period  Weeks    Status  Achieved            Plan - 11/26/19 1147    Clinical Impression Statement  pt is making good progress with physical therapy reporting pain as intermittent. He is very consistenty with his exericses often doing many of them prior to his session or earlier that day. continued TPDN focusing along bil vastus lateralis and R gastroc followed with IASTM. strengthening focused on hip abductors which he required cues on form/ rationale. he is progressing well toward his goals, plan to review HEP address remaining questions next session and discharge.    PT Frequency  1x / week    PT Duration  2 weeks  PT Treatment/Interventions  Cryotherapy;Dentist;Therapeutic activities;Therapeutic exercise;Balance  training;Neuromuscular re-education;Patient/family education;Dry needling;Manual techniques;Taping;Joint Manipulations    PT Next Visit Plan  review HEP, DN PRN, address pt questions and discharge    Consulted and Agree with Plan of Care  Patient       Patient will benefit from skilled therapeutic intervention in order to improve the following deficits and impairments:  Decreased activity tolerance, Pain, Decreased strength, Improper body mechanics, Decreased balance  Visit Diagnosis: Chronic pain of right knee  Chronic pain of left knee  Muscle weakness (generalized)     Problem List Patient Active Problem List   Diagnosis Date Noted  . Functional heartburn   . Vitamin D insufficiency 03/16/2018  . Essential hypertension 03/15/2018  . Patellofemoral syndrome of both knees 06/09/2017  . Benign hypermobility syndrome 06/09/2017  . Hemorrhoids 12/23/2015  . Routine general medical examination at a health care facility 02/16/2015  . GASTROESOPHAGEAL REFLUX DISEASE 12/29/2007   Starr [place] PT, DPT, LAT, ATC  11/26/19  12:48 PM      [facility] [address] [place], [place], [address] Phone: [phone]   Fax:  [phone]  Name: Bernard [name] MRN: [id number] Date of Birth: 1986-08-21

## 2019-11-30 ENCOUNTER — Encounter: Payer: Self-pay | Admitting: Internal Medicine

## 2019-12-04 ENCOUNTER — Other Ambulatory Visit: Payer: Self-pay

## 2019-12-04 ENCOUNTER — Ambulatory Visit: Payer: 59 | Admitting: Physical Therapy

## 2019-12-04 ENCOUNTER — Encounter: Payer: Self-pay | Admitting: Physical Therapy

## 2019-12-04 DIAGNOSIS — G8929 Other chronic pain: Secondary | ICD-10-CM

## 2019-12-04 DIAGNOSIS — M25561 Pain in right knee: Secondary | ICD-10-CM | POA: Diagnosis not present

## 2019-12-04 DIAGNOSIS — M6281 Muscle weakness (generalized): Secondary | ICD-10-CM

## 2019-12-04 NOTE — Therapy (Signed)
[name], [place], [address] Phone: [phone]   Fax:  [phone]  Physical Therapy Treatment / Discharge  Patient Details  Name: Bernard [name] MRN: [id number] Date of Birth: [birth year] Referring Provider (PT): Lyndal Pulley, DO   Encounter Date: 12/04/2019  PT End of Session - 12/04/19 1422    Visit Number  8    Number of Visits  8    Date for PT Re-Evaluation  12/04/19    Authorization Type  UHC    PT Start Time  1418    PT Stop Time  1450    PT Time Calculation (min)  32 min    Activity Tolerance  Patient tolerated treatment well    Behavior During Therapy  [facility] for tasks assessed/performed       Past Medical History:  Diagnosis Date  . Fundic gland polyposis of stomach   . GERD (gastroesophageal reflux disease)   . Hemorrhoid   . Sebaceous cyst     Past Surgical History:  Procedure Laterality Date  . ESOPHAGEAL MANOMETRY N/A 10/09/2019   Procedure: ESOPHAGEAL MANOMETRY (EM);  Surgeon: [name], MD;  Location: [facility];  Service: Endoscopy;  Laterality: N/A;  . ESOPHAGOGASTRODUODENOSCOPY N/A 05/18/2015   Procedure: ESOPHAGOGASTRODUODENOSCOPY (EGD);  Surgeon: Inda Castle, MD;  Location: [facility];  Service: Endoscopy;  Laterality: N/A;  . [place] IMPEDANCE STUDY  10/09/2019   Procedure: [place] IMPEDANCE STUDY;  Surgeon: [name], MD;  Location: [facility];  Service: Endoscopy;;  . WISDOM TOOTH EXTRACTION      There were no vitals filed for this visit.  Subjective Assessment - 12/04/19 1419    Subjective  " I've beeen doing all the exercises and noticed no significant pain"    Currently in Pain?  No/denies         [facility] PT Assessment - 12/04/19 0001      Assessment   Medical Diagnosis  Chronic bilateral knee pain    Referring Provider (PT)  Lyndal Pulley, DO                   [facility] Adult PT Treatment/Exercise - 12/04/19 0001      Knee/Hip  Exercises: Standing   Other Standing Knee Exercises  wall sit with hip abduction with green theraband 1 x 15, then wall sit 1 x 10 with heel raises          Balance Exercises - 12/04/19 1548      Balance Exercises: Standing   Other Standing Exercises  cone taps with SLS rotating L/R UE, cues to avoid rotating knee to maximize knee stability 1 x 10 ea.         PT Education - 12/04/19 1548    Education Details  reviewed HEP and updated today. Reviewed how to appropriately progress strength with increased reps/ sets and resistance. addressed pt questions regarding EDS, and supplements/ nutrition    Person(s) Educated  Patient    Methods  Explanation;Handout;Verbal cues    Comprehension  Verbalized understanding;Verbal cues required       PT Short Term Goals - 11/26/19 1111      PT SHORT TERM GOAL #1   Title  Patient will be independent in initial HEP to maintain progress in PT.    Period  Weeks      PT SHORT TERM GOAL #2   Title  Patient will report reduced bilateral knee pain to < or = 0-1/10  with work activities.    Period  Weeks    Status  Achieved        PT Long Term Goals - 12/04/19 1550      PT LONG TERM GOAL #1   Title  Patient will exhibit improved strength of gross LE > or = 4+/5 in order to improve dynamic control with single leg squat to reduce knee pain.    Period  Weeks    Status  Partially Met      PT LONG TERM GOAL #2   Title  Patient will report improved functional level of FOTO < or = 23% limitation.    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    Period  Weeks    Status  Achieved            Plan - 12/04/19 1550    Clinical Impression Statement  Mr Chalfin  has made great progress with physical therapy increasing hip/ knee strength and additionally reports no pain.reviewed all HEP and he was able to do all exericses today with minimal cues for proper form. he has met or partially met all goals today and is able to maintain and progress his current level of function independently and will be discharged from PT today.    PT Treatment/Interventions  Cryotherapy;Dentist;Therapeutic activities;Therapeutic exercise;Balance training;Neuromuscular re-education;Patient/family education;Dry needling;Manual techniques;Taping;Joint Manipulations    PT Next Visit Plan  DC    Consulted and Agree with Plan of Care  Patient       Patient will benefit from skilled therapeutic intervention in order to improve the following deficits and impairments:  Decreased activity tolerance, Pain, Decreased strength, Improper body mechanics, Decreased balance  Visit Diagnosis: Chronic pain of right knee  Chronic pain of left knee  Muscle weakness (generalized)     Problem List Patient Active Problem List   Diagnosis Date Noted  . Functional heartburn   . Vitamin D insufficiency 03/16/2018  . Essential hypertension 03/15/2018  . Patellofemoral syndrome of both knees 06/09/2017  . Benign hypermobility syndrome 06/09/2017  . Hemorrhoids 12/23/2015  . Routine general medical examination at a health care facility 02/16/2015  . GASTROESOPHAGEAL REFLUX DISEASE 12/29/2007    Bernard [name] 12/04/2019, 3:52 PM  [facility] [address] [place], [place], [address] Phone: [phone]   Fax:  [phone]  Name: Bernard [name] MRN: [id number] Date of Birth: 1986-09-03     PHYSICAL THERAPY DISCHARGE SUMMARY  Visits from Start of Care: 8  Current functional level related to goals / functional outcomes: See goals, FOTO 31% limited   Remaining  deficits: Intermittent soreness that resolves with exercise / stretching   Education / Equipment: HEP, theraband, taping techniques, balance training, posture/ biomechanics, anatomy  Plan: Patient agrees to discharge.  Patient goals were partially met. Patient is being discharged due to being pleased with the current functional level.  ?????        Loudon Krakow PT, DPT, LAT, ATC  12/04/19  3:53 PM

## 2019-12-13 ENCOUNTER — Other Ambulatory Visit: Payer: Self-pay | Admitting: Internal Medicine

## 2019-12-13 MED ORDER — AMITRIPTYLINE HCL 50 MG PO TABS: 50.0000 mg | ORAL_TABLET | Freq: Every day | ORAL | 0 refills | Status: DC

## 2019-12-24 ENCOUNTER — Other Ambulatory Visit: Payer: Self-pay | Admitting: Internal Medicine

## 2019-12-24 MED ORDER — AMITRIPTYLINE HCL 25 MG PO TABS: 25.0000 mg | ORAL_TABLET | Freq: Every day | ORAL | 0 refills | Status: DC

## 2020-01-21 ENCOUNTER — Other Ambulatory Visit: Payer: Self-pay | Admitting: Internal Medicine

## 2020-01-28 ENCOUNTER — Other Ambulatory Visit: Payer: Self-pay | Admitting: Internal Medicine

## 2020-01-28 NOTE — Telephone Encounter (Signed)
Please advise Sir, thank you. 

## 2020-02-19 ENCOUNTER — Other Ambulatory Visit: Payer: Self-pay | Admitting: Internal Medicine

## 2020-02-19 DIAGNOSIS — R12 Heartburn: Secondary | ICD-10-CM

## 2020-02-19 MED ORDER — DULOXETINE HCL 30 MG PO CPEP: 60.0000 mg | ORAL_CAPSULE | Freq: Every day | ORAL | 1 refills | Status: DC

## 2020-04-02 ENCOUNTER — Other Ambulatory Visit: Payer: Self-pay | Admitting: Internal Medicine

## 2020-04-02 DIAGNOSIS — K2100 Gastro-esophageal reflux disease with esophagitis, without bleeding: Secondary | ICD-10-CM

## 2020-04-14 ENCOUNTER — Other Ambulatory Visit: Payer: Self-pay | Admitting: Internal Medicine

## 2020-04-28 ENCOUNTER — Other Ambulatory Visit: Payer: Self-pay | Admitting: Internal Medicine

## 2020-04-28 NOTE — Telephone Encounter (Signed)
I spoke with Bernard [name] and he reports taking 60mg  of duloxetine daily. He said he is feeling better. The heartburn is "pretty good", he takes one tums every several days. He said he is still experiencing lethargy thru out the day but is willing to continue the medicine and hopes that will fade away.

## 2020-04-28 NOTE — Telephone Encounter (Signed)
Please advise Sir, thank you. 

## 2020-04-28 NOTE — Telephone Encounter (Signed)
OK to refill but find out if he is on the 60 mg dose and if so we can dend in 60 mg daily # 90 with 1 refill  Please get a sx update also

## 2020-04-29 NOTE — Telephone Encounter (Signed)
Send in an Rx for duloxetine 60 mg daily and # 90 w/3RF

## 2020-04-29 NOTE — Telephone Encounter (Signed)
I called the pharmacy and they added the refills to his rx as I only put one yesterday.

## 2020-05-11 ENCOUNTER — Encounter: Payer: Self-pay | Admitting: Internal Medicine

## 2020-05-20 ENCOUNTER — Ambulatory Visit: Payer: 59 | Admitting: Internal Medicine

## 2020-05-20 ENCOUNTER — Encounter: Payer: Self-pay | Admitting: Internal Medicine

## 2020-05-20 ENCOUNTER — Other Ambulatory Visit: Payer: Self-pay

## 2020-05-20 VITALS — BP 128/82 | HR 79 | Temp 98.7°F | Resp 16 | Ht 69.0 in | Wt 161.0 lb

## 2020-05-20 DIAGNOSIS — B354 Tinea corporis: Secondary | ICD-10-CM

## 2020-05-20 MED ORDER — TERBINAFINE HCL 250 MG PO TABS: 250.0000 mg | ORAL_TABLET | Freq: Every day | ORAL | 0 refills | Status: AC

## 2020-05-20 NOTE — Patient Instructions (Signed)

## 2020-05-20 NOTE — Progress Notes (Signed)
Subjective:  Patient ID: Bernard [name], male    DOB: 05/22/1986  Age: 34 y.o. MRN: [id number]  CC: Rash  This visit occurred during the SARS-CoV-2 public health emergency.  Safety protocols were in place, including screening questions prior to the visit, additional usage of staff PPE, and extensive cleaning of exam room while observing appropriate contact time as indicated for disinfecting solutions.    HPI Bernard [name] presents for concerns about a rash on his left knee.  It has been present for about 2 months.  He has intermittently but not consistently gotten relief with topical clotrimazole.  It occasionally itches and forms whiteheads.  He has been exposed to ringworm.  Outpatient Medications Prior to Visit  Medication Sig Dispense Refill  . cetirizine (ZYRTEC) 10 MG tablet Take 10 mg by mouth every morning.     . Cholecalciferol (VITAMIN D) 50 MCG (2000 UT) CAPS Take 1 capsule by mouth daily.    Marland Kitchen DEXILANT 60 MG capsule TAKE 1 CAPSULE BY MOUTH DAILY BEFORE BREAKFAST 90 capsule 1  . DULoxetine (CYMBALTA) 60 MG capsule Take 1 capsule (60 mg total) by mouth daily. 90 capsule 1  . famotidine (PEPCID) 20 MG tablet TAKE 1 TABLET(20 MG) BY MOUTH TWICE DAILY 180 tablet 0  . Multiple Vitamin (MULTIVITAMIN WITH MINERALS) TABS tablet Take 1 tablet by mouth daily.    . sucralfate (CARAFATE) 1 g tablet Take 1 tablet (1 g total) by mouth 4 (four) times daily -  with meals and at bedtime. 120 tablet 2   No facility-administered medications prior to visit.    ROS Review of Systems  Constitutional: Negative for chills and fatigue.  Gastrointestinal: Negative for abdominal pain.  Musculoskeletal: Negative.   Skin: Positive for color change and rash.  Hematological: Negative for adenopathy. Does not bruise/bleed easily.  Psychiatric/Behavioral: Negative.   All other systems reviewed and are negative.   Objective:  BP 128/82 (BP Location: Left Arm, Patient Position: Sitting, Cuff  Size: Normal)   Pulse 79   Temp 98.7 F (37.1 C) (Oral)   Resp 16   Ht 5\' 9"  (1.753 m)   Wt 161 lb (73 kg)   SpO2 98%   BMI 23.78 kg/m   BP Readings from Last 3 Encounters:  05/20/20 128/82  11/06/19 132/80  09/25/19 100/70    Wt Readings from Last 3 Encounters:  05/20/20 161 lb (73 kg)  11/06/19 168 lb (76.2 kg)  09/16/19 162 lb (73.5 kg)    Physical Exam Skin:    Comments: Left lateral knee shows 3 groups of coalesced erythematous papules with faint scale but no vesicles, pustules, induration, fluctuance, or streaking.     Lab Results  Component Value Date   WBC 5.4 09/17/2019   HGB 15.3 09/17/2019   HCT 44.9 09/17/2019   PLT 258.0 09/17/2019   GLUCOSE 97 08/13/2019   CHOL 137 09/17/2019   TRIG 115.0 09/17/2019   HDL 47.60 09/17/2019   LDLCALC 66 09/17/2019   ALT 29 08/13/2019   AST 27 08/13/2019   NA 139 08/13/2019   K 3.7 08/13/2019   CL 101 08/13/2019   CREATININE 0.98 08/13/2019   BUN 16 08/13/2019   CO2 30 08/13/2019   TSH 1.27 08/13/2019   HGBA1C 5.6 02/28/2017    No results found.  Assessment & Plan:   Bernard [name] was seen today for rash.  Diagnoses and all orders for this visit:  Tinea corporis- He deferred on undergoing a biopsy.  Will empirically treat for tinea with terbinafine.  If this does not cure it then he will return to consider other treatment options and a biopsy. -     terbinafine (LAMISIL) 250 MG tablet; Take 1 tablet (250 mg total) by mouth daily for 10 days.   I have discontinued [facility] [place]'s sucralfate. I am also having him start on terbinafine. Additionally, I am having him maintain his cetirizine, multivitamin with minerals, Vitamin D, Dexilant, famotidine, and DULoxetine.  Meds ordered this encounter  Medications  . terbinafine (LAMISIL) 250 MG tablet    Sig: Take 1 tablet (250 mg total) by mouth daily for 10 days.    Dispense:  10 tablet    Refill:  0     Follow-up: Return if symptoms worsen or fail to  improve.  Sanda Linger, MD

## 2020-05-21 ENCOUNTER — Ambulatory Visit: Payer: 59 | Admitting: Internal Medicine

## 2020-07-13 ENCOUNTER — Other Ambulatory Visit: Payer: Self-pay | Admitting: Internal Medicine

## 2020-09-22 DIAGNOSIS — Z0279 Encounter for issue of other medical certificate: Secondary | ICD-10-CM

## 2020-09-23 ENCOUNTER — Other Ambulatory Visit: Payer: Self-pay | Admitting: Internal Medicine

## 2020-09-23 DIAGNOSIS — K2100 Gastro-esophageal reflux disease with esophagitis, without bleeding: Secondary | ICD-10-CM

## 2020-10-09 ENCOUNTER — Other Ambulatory Visit: Payer: Self-pay | Admitting: Internal Medicine

## 2020-11-21 ENCOUNTER — Encounter: Payer: Self-pay | Admitting: Internal Medicine

## 2020-11-23 ENCOUNTER — Other Ambulatory Visit: Payer: Self-pay

## 2020-11-23 MED ORDER — FAMOTIDINE 20 MG PO TABS: ORAL_TABLET | ORAL | 1 refills | Status: DC

## 2020-11-23 NOTE — Telephone Encounter (Signed)
Famotidine 20mg  tablets refilled to his new .

## 2021-01-08 ENCOUNTER — Ambulatory Visit: Payer: 59 | Admitting: Family Medicine

## 2021-01-21 ENCOUNTER — Other Ambulatory Visit: Payer: Self-pay | Admitting: Internal Medicine

## 2021-01-21 DIAGNOSIS — K219 Gastro-esophageal reflux disease without esophagitis: Secondary | ICD-10-CM

## 2021-01-21 MED ORDER — ESOMEPRAZOLE MAGNESIUM 40 MG PO CPDR: 40.0000 mg | DELAYED_RELEASE_CAPSULE | Freq: Every day | ORAL | 1 refills | Status: DC

## 2021-02-03 ENCOUNTER — Telehealth: Payer: Self-pay | Admitting: Internal Medicine

## 2021-02-03 NOTE — Telephone Encounter (Signed)
Patient returned the call states he received a letter from insurance and they will accept Omprazole, Pantoprazole and Rabeprazole however he said he has tried the first two medications in the past and has not had any success and\or relief. Patient has additional questions and would like to speak with you.   Tel: [phone].

## 2021-02-03 NOTE — Telephone Encounter (Signed)
I left him a detailed message to call his insurance company and find out what they pay for and let us know the choices so we can see which one Dr Leone Payor wants him to try. I left the office hours for this week and out #.

## 2021-02-04 NOTE — Telephone Encounter (Signed)
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 He was on Omeprazole and we switched him to the Dexilant after his EGD. He has tried pantoprazole years ago without relief. He has not tried the rabeprazole. He is concerned about "rebound effects" when / if he changes rx's.He was last seen in November 2020. Please advise Sir?

## 2021-02-09 NOTE — Telephone Encounter (Signed)
Message left explaining that if I have not had a physical in person or video visit with someone my chart will not work until we reestablish care within the year.  At any rate I left him my cell number to discuss his situation.

## 2021-02-10 ENCOUNTER — Telehealth: Payer: Self-pay | Admitting: Internal Medicine

## 2021-02-10 MED ORDER — DEXLANSOPRAZOLE 60 MG PO CPDR: 60.0000 mg | DELAYED_RELEASE_CAPSULE | Freq: Every day | ORAL | 3 refills | Status: DC

## 2021-02-10 NOTE — Telephone Encounter (Signed)
Reviewed situation with patient.  We have decided to try getting his [employer] generic through [place].  Prescription out front.  He is to schedule follow-up later this year and it give me feedback.

## 2021-02-10 NOTE — Telephone Encounter (Signed)
rx for meds via [place]

## 2021-02-10 NOTE — Addendum Note (Signed)
Addended by: Iva Boop on: 02/10/2021 08:21 AM   Modules accepted: Orders

## 2021-03-16 ENCOUNTER — Other Ambulatory Visit: Payer: Self-pay | Admitting: Internal Medicine

## 2021-03-16 ENCOUNTER — Telehealth: Payer: Self-pay | Admitting: Internal Medicine

## 2021-03-16 MED ORDER — LANSOPRAZOLE 30 MG PO CPDR: 30.0000 mg | DELAYED_RELEASE_CAPSULE | Freq: Two times a day (BID) | ORAL | 1 refills | Status: DC

## 2021-03-16 NOTE — Telephone Encounter (Signed)
Wants bid omeprazole while waiting for dexilant to arrive

## 2021-03-17 ENCOUNTER — Telehealth: Payer: Self-pay

## 2021-03-17 NOTE — Telephone Encounter (Signed)
Received a fax that the lansoprazole has been denied. May I send in one of the alternatives to hold him until his Dexilant comes Sir.

## 2021-03-17 NOTE — Telephone Encounter (Signed)
I have started a prior authorization for the lansoprazole 30mg  capsules sent in yesterday by Dr for patients GERD K21.9. They will let Bernard [name] know by fax of approval / denial. Ref # [id number]. She said the alternatives are omeprazole or pantoprazole.

## 2021-03-17 NOTE — Telephone Encounter (Signed)
He paid cash through [employer] and has it

## 2021-04-19 ENCOUNTER — Encounter: Payer: Self-pay | Admitting: Internal Medicine

## 2021-04-21 ENCOUNTER — Encounter: Payer: Self-pay | Admitting: Internal Medicine

## 2021-04-21 ENCOUNTER — Ambulatory Visit: Payer: 59 | Admitting: Internal Medicine

## 2021-04-21 ENCOUNTER — Other Ambulatory Visit: Payer: Self-pay

## 2021-04-21 DIAGNOSIS — L989 Disorder of the skin and subcutaneous tissue, unspecified: Secondary | ICD-10-CM | POA: Diagnosis not present

## 2021-04-21 DIAGNOSIS — K219 Gastro-esophageal reflux disease without esophagitis: Secondary | ICD-10-CM | POA: Diagnosis not present

## 2021-04-21 MED ORDER — AZITHROMYCIN 250 MG PO TABS: ORAL_TABLET | ORAL | 0 refills | Status: AC

## 2021-04-21 MED ORDER — CLOTRIMAZOLE-BETAMETHASONE 1-0.05 % EX CREA: 1.0000 "application " | TOPICAL_CREAM | Freq: Every day | CUTANEOUS | 0 refills | Status: DC

## 2021-04-21 NOTE — Patient Instructions (Signed)
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  Please have the pharmacy call with any other refills you may need.  Please keep your appointments with your specialists as you may have planned  Please call if you need a referral to dermatology if not improved or getting worse

## 2021-04-25 ENCOUNTER — Encounter: Payer: Self-pay | Admitting: Internal Medicine

## 2021-04-25 DIAGNOSIS — L989 Disorder of the skin and subcutaneous tissue, unspecified: Secondary | ICD-10-CM | POA: Insufficient documentation

## 2021-04-25 NOTE — Assessment & Plan Note (Addendum)
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

## 2021-04-25 NOTE — Assessment & Plan Note (Signed)
O/w stable, cont pepcid, prevacid asd,  to f/u any worsening symptoms or concerns

## 2021-04-25 NOTE — Progress Notes (Signed)
Patient ID: Bernard [name], male   DOB: December 16, 1985, 35 y.o.   MRN: [id number]        Chief Complaint: follow up skin lesion right jawline       HPI:  Bernard [name] is a 35 y.o. male here with 1 wk onset gradually worsening but still small area to right mid jawline of raised crusty lesion mild tender but without fluctuance, drainage or pus, seems to be aggrevated by shaving and has to shave around it and actually not shaved in last 2 days, but no fever, chills, worsening redness or other lumps or swelling.  Has hx of ringworm in past so wondering if might be similar.  No other lesions to face or other noted.  Denies worsening reflux, abd pain, dysphagia, n/v, bowel change or blood.       Wt Readings from Last 3 Encounters:  04/21/21 155 lb (70.3 kg)  05/20/20 161 lb (73 kg)  11/06/19 168 lb (76.2 kg)   BP Readings from Last 3 Encounters:  04/21/21 132/78  05/20/20 128/82  11/06/19 132/80         Past Medical History:  Diagnosis Date   Fundic gland polyposis of stomach    GERD (gastroesophageal reflux disease)    Hemorrhoid    Sebaceous cyst    Past Surgical History:  Procedure Laterality Date   ESOPHAGEAL MANOMETRY N/A 10/09/2019   Procedure: ESOPHAGEAL MANOMETRY (EM);  Surgeon: Iva Boop, MD;  Location: [facility];  Service: Endoscopy;  Laterality: N/A;   ESOPHAGOGASTRODUODENOSCOPY N/A 05/18/2015   Procedure: ESOPHAGOGASTRODUODENOSCOPY (EGD);  Surgeon: Louis Meckel, MD;  Location: [facility];  Service: Endoscopy;  Laterality: N/A;   PH IMPEDANCE STUDY  10/09/2019   Procedure: PH IMPEDANCE STUDY;  Surgeon: Iva Boop, MD;  Location: [facility];  Service: Endoscopy;;   WISDOM TOOTH EXTRACTION      reports that he quit smoking about 8 years ago. His smoking use included e-cigarettes and cigarettes. He has a 10.00 pack-year smoking history. He has never used smokeless tobacco. He reports current alcohol use. He reports that he does not use drugs. family history  includes Breast cancer in his maternal grandmother; GER disease in his father; Heart disease in his father; Hypertension in his father. Allergies  Allergen Reactions   Mold Extract [Trichophyton]     Sneezing, runny nose.    Current Outpatient Medications on File Prior to Visit  Medication Sig Dispense Refill   cetirizine (ZYRTEC) 10 MG tablet Take 10 mg by mouth every morning.      Cholecalciferol (VITAMIN D) 50 MCG (2000 UT) CAPS Take 1 capsule by mouth daily.     dexlansoprazole (DEXILANT) 60 MG capsule Take 1 capsule (60 mg total) by mouth daily. 100 capsule 3   famotidine (PEPCID) 20 MG tablet TAKE 1 TABLET(20 MG) BY MOUTH TWICE DAILY 180 tablet 1   ferrous sulfate 324 MG TBEC Take 324 mg by mouth 3 days.     TURMERIC PO Take by mouth.     vitamin C (ASCORBIC ACID) 500 MG tablet Take 500 mg by mouth daily.     DULoxetine (CYMBALTA) 60 MG capsule Take 1 capsule (60 mg total) by mouth daily. (Patient not taking: Reported on 04/21/2021) 90 capsule 1   lansoprazole (PREVACID) 30 MG capsule Take 1 capsule (30 mg total) by mouth 2 (two) times daily before a meal. (Patient not taking: Reported on 04/21/2021) 60 capsule 1   Multiple Vitamin (MULTIVITAMIN WITH MINERALS) TABS tablet  Take 1 tablet by mouth daily. (Patient not taking: Reported on 04/21/2021)     No current facility-administered medications on file prior to visit.        ROS:  All others reviewed and negative.  Objective        PE:  BP 132/78 (BP Location: Left Arm, Patient Position: Sitting, Cuff Size: Normal)   Pulse 76   Temp 97.9 F (36.6 C) (Oral)   Ht 5\' 9"  (1.753 m)   Wt 155 lb (70.3 kg)   SpO2 98%   BMI 22.89 kg/m                 Constitutional: Pt appears in NAD               HENT: Head: NCAT.                Right Ear: External ear normal.                 Left Ear: External ear normal.                Eyes: . Pupils are equal, round, and reactive to light. Conjunctivae and EOM are normal               Nose:  without d/c or deformity               Neck: Neck supple. Gross normal ROM               Cardiovascular: Normal rate and regular rhythm.                 Pulmonary/Chest: Effort normal and breath sounds without rales or wheezing.                               Neurological: Pt is alert. At baseline orientation, motor grossly intact               Skin:  LE edema - none, right mid jawline with 15 mm area raised crusty mild tender lesion without significant surrounding redness, swelling, red streaks, fluctuance or drainage                Psychiatric: Pt behavior is normal without agitation   Micro: none  Cardiac tracings I have personally interpreted today:  none  Pertinent Radiological findings (summarize): none   Lab Results  Component Value Date   WBC 5.4 09/17/2019   HGB 15.3 09/17/2019   HCT 44.9 09/17/2019   PLT 258.0 09/17/2019   GLUCOSE 97 08/13/2019   CHOL 137 09/17/2019   TRIG 115.0 09/17/2019   HDL 47.60 09/17/2019   LDLCALC 66 09/17/2019   ALT 29 08/13/2019   AST 27 08/13/2019   NA 139 08/13/2019   K 3.7 08/13/2019   CL 101 08/13/2019   CREATININE 0.98 08/13/2019   BUN 16 08/13/2019   CO2 30 08/13/2019   TSH 1.27 08/13/2019   HGBA1C 5.6 02/28/2017   Assessment/Plan:  Bernard [name] is a 35 y.o. White or Caucasian [1] male with  has a past medical history of Fundic gland polyposis of stomach, GERD (gastroesophageal reflux disease), Hemorrhoid, and Sebaceous cyst.  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  Oliver Barre, MD 04/25/2021 4:29 PM [facility]

## 2021-05-17 ENCOUNTER — Other Ambulatory Visit: Payer: Self-pay | Admitting: Internal Medicine

## 2021-05-24 NOTE — Progress Notes (Signed)
   I, Bernard [name], LAT, ATC acting as a scribe for Bernard Graham, MD.  Subjective:    CC: Right upper arm pain  HPI: Pt is a 35 y/o male c/o R upper arm pain ongoing since 05/17/21. MOI: Pt does jiu jitsu and had a pretty intense 3 hour session, but doesn't recall a specific mechanism.  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 Pt notes that pain has improved, but is still somewhat painful. Pt locates pain to mid-humerus, w/ a "muscle ache" type of pain in the distal triceps and into anterior shoulder/chest. Pt notes pain location will vary. Pt reports that pain does not increase w/ exertion.  Neck pain: no Radiates: no UE Numbness/tingling: no UE Weakness: no Aggravates: resting it on the arm rest in the car Treatments tried: icy hot, IBU, Tylenol, epsom salt baths, hot, ice,   Pertinent review of Systems: No fevers or chills  Relevant historical information: Patient works as a paramedic   Objective:    Vitals:   05/25/21 0942  BP: 122/78  Pulse: 66  SpO2: 98%   General: Well Developed, well nourished, and in no acute distress.   MSK:  C-spine normal-appearing Nontender. Normal cervical motion. Upper summary strength reflexes and sensation are equal and normal throughout. Right shoulder normal-appearing nontender normal strength normal motion negative impingement testing. Normal stability testing.   Impression and Recommendations:    Assessment and Plan: 35 y.o. male with right arm pain after grappling with jujitsu.  Etiology somewhat unclear.  Unable to provoke pain today with exam testing.  Muscle strain is certainly a possibility.  Atypical presentation of cervical radicular pain is also a possibility.  His pain is already improving with a bit of rest over about a 1 week.  Plan for modification of activity and resumption of activity as tolerated when feeling better.  Consider physical therapy if needed.  He will let me know.  Next step would also be  consider trial of short course of prednisone and gabapentin as needed for possible cervical radicular pain.Marland Kitchen He will let me know.    Discussed warning signs or symptoms. Please see discharge instructions. Patient expresses understanding.   The above documentation has been reviewed and is accurate and complete Bernard [name], M.D.

## 2021-05-25 ENCOUNTER — Ambulatory Visit: Payer: 59 | Admitting: Family Medicine

## 2021-05-25 ENCOUNTER — Other Ambulatory Visit: Payer: Self-pay

## 2021-05-25 VITALS — BP 122/78 | HR 66 | Ht 69.0 in | Wt 154.0 lb

## 2021-05-25 DIAGNOSIS — M79601 Pain in right arm: Secondary | ICD-10-CM | POA: Diagnosis not present

## 2021-05-25 NOTE — Patient Instructions (Signed)
Thank you for coming in today.   Ok to participate if you feel ok.   If not doing well let me know.  Next step is PT + prednisone and gabapentin.

## 2021-05-29 ENCOUNTER — Encounter: Payer: Self-pay | Admitting: Family Medicine

## 2021-05-31 MED ORDER — GABAPENTIN 300 MG PO CAPS: 300.0000 mg | ORAL_CAPSULE | Freq: Three times a day (TID) | ORAL | 3 refills | Status: DC | PRN

## 2021-05-31 MED ORDER — PREDNISONE 50 MG PO TABS: 50.0000 mg | ORAL_TABLET | Freq: Every day | ORAL | 0 refills | Status: DC

## 2021-05-31 NOTE — Telephone Encounter (Signed)
Pt called back to check on when these prescriptions will be sent into the pharmacy.

## 2021-05-31 NOTE — Telephone Encounter (Signed)
Patient called back following up. ?

## 2021-06-03 ENCOUNTER — Ambulatory Visit: Payer: 59 | Admitting: Internal Medicine

## 2021-06-04 ENCOUNTER — Other Ambulatory Visit: Payer: Self-pay | Admitting: Internal Medicine

## 2021-06-07 NOTE — Telephone Encounter (Signed)
Famotidine refilled as requested. 

## 2021-07-13 ENCOUNTER — Other Ambulatory Visit: Payer: Self-pay

## 2021-07-14 ENCOUNTER — Ambulatory Visit (INDEPENDENT_AMBULATORY_CARE_PROVIDER_SITE_OTHER): Payer: 59 | Admitting: Internal Medicine

## 2021-07-14 ENCOUNTER — Encounter: Payer: Self-pay | Admitting: Internal Medicine

## 2021-07-14 VITALS — BP 114/72 | HR 82 | Temp 98.2°F | Ht 69.0 in | Wt 154.0 lb

## 2021-07-14 DIAGNOSIS — E559 Vitamin D deficiency, unspecified: Secondary | ICD-10-CM | POA: Diagnosis not present

## 2021-07-14 DIAGNOSIS — R5382 Chronic fatigue, unspecified: Secondary | ICD-10-CM | POA: Diagnosis not present

## 2021-07-14 DIAGNOSIS — G44209 Tension-type headache, unspecified, not intractable: Secondary | ICD-10-CM

## 2021-07-14 DIAGNOSIS — Z0000 Encounter for general adult medical examination without abnormal findings: Secondary | ICD-10-CM | POA: Diagnosis not present

## 2021-07-14 DIAGNOSIS — Z0001 Encounter for general adult medical examination with abnormal findings: Secondary | ICD-10-CM

## 2021-07-14 DIAGNOSIS — K2100 Gastro-esophageal reflux disease with esophagitis, without bleeding: Secondary | ICD-10-CM

## 2021-07-14 LAB — IBC + FERRITIN
Ferritin: 62.9 ng/mL (ref 22.0–322.0)
Iron: 82 ug/dL (ref 42–165)
Saturation Ratios: 29.4 % (ref 20.0–50.0)
TIBC: 278.6 ug/dL (ref 250.0–450.0)
Transferrin: 199 mg/dL — ABNORMAL LOW (ref 212.0–360.0)

## 2021-07-14 LAB — CBC WITH DIFFERENTIAL/PLATELET
Basophils Absolute: 0 10*3/uL (ref 0.0–0.1)
Basophils Relative: 0.4 % (ref 0.0–3.0)
Eosinophils Absolute: 0.1 10*3/uL (ref 0.0–0.7)
Eosinophils Relative: 1 % (ref 0.0–5.0)
HCT: 42.5 % (ref 39.0–52.0)
Hemoglobin: 14.5 g/dL (ref 13.0–17.0)
Lymphocytes Relative: 19.6 % (ref 12.0–46.0)
Lymphs Abs: 1.5 10*3/uL (ref 0.7–4.0)
MCHC: 34.1 g/dL (ref 30.0–36.0)
MCV: 87.9 fl (ref 78.0–100.0)
Monocytes Absolute: 0.4 10*3/uL (ref 0.1–1.0)
Monocytes Relative: 5.9 % (ref 3.0–12.0)
Neutro Abs: 5.5 10*3/uL (ref 1.4–7.7)
Neutrophils Relative %: 73.1 % (ref 43.0–77.0)
Platelets: 267 10*3/uL (ref 150.0–400.0)
RBC: 4.84 Mil/uL (ref 4.22–5.81)
RDW: 12.9 % (ref 11.5–15.5)
WBC: 7.5 10*3/uL (ref 4.0–10.5)

## 2021-07-14 LAB — VITAMIN D 25 HYDROXY (VIT D DEFICIENCY, FRACTURES): VITD: 41.26 ng/mL (ref 30.00–100.00)

## 2021-07-14 LAB — HEPATIC FUNCTION PANEL
ALT: 21 U/L (ref 0–53)
AST: 17 U/L (ref 0–37)
Albumin: 4.3 g/dL (ref 3.5–5.2)
Alkaline Phosphatase: 47 U/L (ref 39–117)
Bilirubin, Direct: 0.1 mg/dL (ref 0.0–0.3)
Total Bilirubin: 0.5 mg/dL (ref 0.2–1.2)
Total Protein: 6.6 g/dL (ref 6.0–8.3)

## 2021-07-14 LAB — BASIC METABOLIC PANEL
BUN: 19 mg/dL (ref 6–23)
CO2: 30 mEq/L (ref 19–32)
Calcium: 9.2 mg/dL (ref 8.4–10.5)
Chloride: 104 mEq/L (ref 96–112)
Creatinine, Ser: 0.96 mg/dL (ref 0.40–1.50)
GFR: 101.98 mL/min (ref >60.00)
Glucose, Bld: 64 mg/dL — ABNORMAL LOW (ref 70–99)
Potassium: 3.6 mEq/L (ref 3.5–5.1)
Sodium: 141 mEq/L (ref 135–145)

## 2021-07-14 LAB — TSH: TSH: 1 u[IU]/mL (ref 0.35–5.50)

## 2021-07-14 LAB — SEDIMENTATION RATE: Sed Rate: 2 mm/hr (ref 0–15)

## 2021-07-14 NOTE — Patient Instructions (Signed)
Health Maintenance, Male Adopting a healthy lifestyle and getting preventive care are important in promoting health and wellness. Ask your health care provider about: The right schedule for you to have regular tests and exams. Things you can do on your own to prevent diseases and keep yourself healthy. What should I know about diet, weight, and exercise? Eat a healthy diet  Eat a diet that includes plenty of vegetables, fruits, low-fat dairy products, and lean protein. Do not eat a lot of foods that are high in solid fats, added sugars, or sodium. Maintain a healthy weight Body mass index (BMI) is a measurement that can be used to identify possible weight problems. It estimates body fat based on height and weight. Your health care provider can help determine your BMI and help you achieve or maintain a healthy weight. Get regular exercise Get regular exercise. This is one of the most important things you can do for your health. Most adults should: Exercise for at least 150 minutes each week. The exercise should increase your heart rate and make you sweat (moderate-intensity exercise). Do strengthening exercises at least twice a week. This is in addition to the moderate-intensity exercise. Spend less time sitting. Even light physical activity can be beneficial. Watch cholesterol and blood lipids Have your blood tested for lipids and cholesterol at 36 years of age, then have this test every 5 years. You may need to have your cholesterol levels checked more often if: Your lipid or cholesterol levels are high. You are older than 36 years of age. You are at high risk for heart disease. What should I know about cancer screening? Many types of cancers can be detected early and may often be prevented. Depending on your health history and family history, you may need to have cancer screening at various ages. This may include screening for: Colorectal cancer. Prostate cancer. Skin cancer. Lung  cancer. What should I know about heart disease, diabetes, and high blood pressure? Blood pressure and heart disease High blood pressure causes heart disease and increases the risk of stroke. This is more likely to develop in people who have high blood pressure readings, are of African descent, or are overweight. Talk with your health care provider about your target blood pressure readings. Have your blood pressure checked: Every 3-5 years if you are 18-39 years of age. Every year if you are 40 years old or older. If you are between the ages of 65 and 75 and are a current or former smoker, ask your health care provider if you should have a one-time screening for abdominal aortic aneurysm (AAA). Diabetes Have regular diabetes screenings. This checks your fasting blood sugar level. Have the screening done: Once every three years after age 45 if you are at a normal weight and have a low risk for diabetes. More often and at a younger age if you are overweight or have a high risk for diabetes. What should I know about preventing infection? Hepatitis B If you have a higher risk for hepatitis B, you should be screened for this virus. Talk with your health care provider to find out if you are at risk for hepatitis B infection. Hepatitis C Blood testing is recommended for: Everyone born from 1945 through 1965. Anyone with known risk factors for hepatitis C. Sexually transmitted infections (STIs) You should be screened each year for STIs, including gonorrhea and chlamydia, if: You are sexually active and are younger than 36 years of age. You are older than 36 years   of age and your health care provider tells you that you are at risk for this type of infection. Your sexual activity has changed since you were last screened, and you are at increased risk for chlamydia or gonorrhea. Ask your health care provider if you are at risk. Ask your health care provider about whether you are at high risk for HIV.  Your health care provider may recommend a prescription medicine to help prevent HIV infection. If you choose to take medicine to prevent HIV, you should first get tested for HIV. You should then be tested every 3 months for as long as you are taking the medicine. Follow these instructions at home: Lifestyle Do not use any products that contain nicotine or tobacco, such as cigarettes, e-cigarettes, and chewing tobacco. If you need help quitting, ask your health care provider. Do not use street drugs. Do not share needles. Ask your health care provider for help if you need support or information about quitting drugs. Alcohol use Do not drink alcohol if your health care provider tells you not to drink. If you drink alcohol: Limit how much you have to 0-2 drinks a day. Be aware of how much alcohol is in your drink. In the [place] one drink equals one 12 oz bottle of beer (355 mL), one 5 oz glass of wine (148 mL), or one 1 oz glass of hard liquor (44 mL). General instructions Schedule regular health, dental, and eye exams. Stay current with your vaccines. Tell your health care provider if: You often feel depressed. You have ever been abused or do not feel safe at home. Summary Adopting a healthy lifestyle and getting preventive care are important in promoting health and wellness. Follow your health care provider's instructions about healthy diet, exercising, and getting tested or screened for diseases. Follow your health care provider's instructions on monitoring your cholesterol and blood pressure. This information is not intended to replace advice given to you by your health care provider. Make sure you discuss any questions you have with your health care provider. Document Revised: 12/18/2020 Document Reviewed: 10/03/2018 Elsevier Patient Education  2022 Elsevier Inc.  

## 2021-07-14 NOTE — Progress Notes (Signed)
Subjective:  Patient ID: Bernard [name], male    DOB: [birth year]  Age: 35 y.o. MRN: [id number]  CC: Annual Exam and Gastroesophageal Reflux  This visit occurred during the SARS-CoV-2 public health emergency.  Safety protocols were in place, including screening questions prior to the visit, additional usage of staff PPE, and extensive cleaning of exam room while observing appropriate contact time as indicated for disinfecting solutions.    HPI [name] presents for a CPX and f/up -   For the last year he has had tension type headaches that occur about 4 times a month.  The headaches are triggered by staring at a computer screen or monitor.  The headaches go away after he removes himself from the trigger.  He denies changes in his vision or hearing, nausea, vomiting, paresthesias, slurred speech, or ataxia.  He tells me his GERD symptoms are well controlled with the combination of famotidine and Dexilant.  Outpatient Medications Prior to Visit  Medication Sig Dispense Refill   cetirizine (ZYRTEC) 10 MG tablet Take 10 mg by mouth every morning.      Cholecalciferol (VITAMIN D) 50 MCG (2000 UT) CAPS Take 1 capsule by mouth daily.     dexlansoprazole (DEXILANT) 60 MG capsule Take 1 capsule (60 mg total) by mouth daily. 100 capsule 3   famotidine (PEPCID) 20 MG tablet Take 1 tablet (20 mg) by mouth twice daily. 180 tablet 1   ferrous sulfate 324 MG TBEC Take 324 mg by mouth 3 days.     TURMERIC PO Take by mouth.     clotrimazole-betamethasone (LOTRISONE) cream Apply 1 application topically daily. 30 g 0   DULoxetine (CYMBALTA) 60 MG capsule Take 1 capsule (60 mg total) by mouth daily. 90 capsule 1   gabapentin (NEURONTIN) 300 MG capsule Take 1 capsule (300 mg total) by mouth 3 (three) times daily as needed. 90 capsule 3   lansoprazole (PREVACID) 30 MG capsule Take 1 capsule (30 mg total) by mouth 2 (two) times daily before a meal. 60 capsule 1   Multiple Vitamin (MULTIVITAMIN WITH  MINERALS) TABS tablet Take 1 tablet by mouth daily.     predniSONE (DELTASONE) 50 MG tablet Take 1 tablet (50 mg total) by mouth daily. 5 tablet 0   vitamin C (ASCORBIC ACID) 500 MG tablet Take 500 mg by mouth daily.     No facility-administered medications prior to visit.    ROS Review of Systems  Constitutional:  Positive for fatigue (chronic). Negative for appetite change, diaphoresis and unexpected weight change.  HENT: Negative.  Negative for sore throat, trouble swallowing and voice change.   Eyes: Negative.  Negative for visual disturbance.  Respiratory:  Negative for cough, chest tightness, shortness of breath and wheezing.   Cardiovascular:  Negative for chest pain, palpitations and leg swelling.  Gastrointestinal:  Negative for abdominal pain, constipation, diarrhea, nausea and vomiting.  Genitourinary: Negative.  Negative for difficulty urinating, scrotal swelling and testicular pain.  Musculoskeletal: Negative.  Negative for myalgias.  Skin: Negative.  Negative for color change.  Neurological:  Positive for headaches. Negative for dizziness and weakness.  Hematological:  Negative for adenopathy. Does not bruise/bleed easily.  Psychiatric/Behavioral: Negative.     Objective:  BP 114/72 (BP Location: Left Arm, Patient Position: Sitting, Cuff Size: Large)   Pulse 82   Temp 98.2 F (36.8 C) (Oral)   Ht 5\' 9"  (1.753 m)   Wt 154 lb (69.9 kg)   SpO2 98%   BMI 22.74  kg/m   BP Readings from Last 3 Encounters:  07/14/21 114/72  05/25/21 122/78  04/21/21 132/78    Wt Readings from Last 3 Encounters:  07/14/21 154 lb (69.9 kg)  05/25/21 154 lb (69.9 kg)  04/21/21 155 lb (70.3 kg)    Physical Exam Vitals reviewed.  Constitutional:      Appearance: Normal appearance.  HENT:     Nose: Nose normal.  Eyes:     General: No scleral icterus.    Extraocular Movements: Extraocular movements intact.     Conjunctiva/sclera: Conjunctivae normal.     Pupils: Pupils are equal,  round, and reactive to light.  Cardiovascular:     Rate and Rhythm: Normal rate and regular rhythm.     Heart sounds: No murmur heard. Pulmonary:     Effort: Pulmonary effort is normal.     Breath sounds: No stridor. No wheezing, rhonchi or rales.  Abdominal:     General: Abdomen is flat.     Palpations: There is no mass.     Tenderness: There is no abdominal tenderness. There is no guarding.     Hernia: No hernia is present.  Musculoskeletal:        General: Normal range of motion.     Cervical back: Neck supple.     Right lower leg: No edema.     Left lower leg: No edema.  Lymphadenopathy:     Cervical: No cervical adenopathy.  Skin:    General: Skin is warm and dry.  Neurological:     General: No focal deficit present.     Mental Status: He is alert and oriented to person, place, and time. Mental status is at baseline.     Cranial Nerves: No cranial nerve deficit.     Sensory: No sensory deficit.     Motor: No weakness.     Coordination: Coordination normal.     Gait: Gait normal.     Deep Tendon Reflexes: Reflexes normal.  Psychiatric:        Mood and Affect: Mood normal.        Behavior: Behavior normal.    Lab Results  Component Value Date   WBC 7.5 07/14/2021   HGB 14.5 07/14/2021   HCT 42.5 07/14/2021   PLT 267.0 07/14/2021   GLUCOSE 64 (L) 07/14/2021   CHOL 137 09/17/2019   TRIG 115.0 09/17/2019   HDL 47.60 09/17/2019   LDLCALC 66 09/17/2019   ALT 21 07/14/2021   AST 17 07/14/2021   NA 141 07/14/2021   K 3.6 07/14/2021   CL 104 07/14/2021   CREATININE 0.96 07/14/2021   BUN 19 07/14/2021   CO2 30 07/14/2021   TSH 1.00 07/14/2021   HGBA1C 5.6 02/28/2017    No results found.  Assessment & Plan:   Bernard [name] was seen today for annual exam and gastroesophageal reflux.  Diagnoses and all orders for this visit:  Encounter for general adult medical examination with abnormal findings- Exam completed, labs reviewed, vaccines are up-to-date, no cancer  screenings indicated, patient education was given.  Tension headache- His labs are reassuring.  He says the headaches are not problematic enough for him to need to treat them. -     Sedimentation rate; Future -     Sedimentation rate  Chronic fatigue- His labs are negative for secondary causes. -     CBC with Differential/Platelet; Future -     Basic metabolic panel; Future -     IBC + Ferritin; Future -  TSH; Future -     Hepatic function panel; Future -     Hepatic function panel -     TSH -     IBC + Ferritin -     Basic metabolic panel -     CBC with Differential/Platelet  Vitamin D deficiency- His Vit D level is normal now. -     VITAMIN D 25 Hydroxy (Vit-D Deficiency, Fractures); Future -     VITAMIN D 25 Hydroxy (Vit-D Deficiency, Fractures)  Gastroesophageal reflux disease with esophagitis without hemorrhage- His symptoms are well controlled.  No complications noted. -     CBC with Differential/Platelet; Future -     CBC with Differential/Platelet  I have discontinued Bernard [name]'s multivitamin with minerals, DULoxetine, lansoprazole, vitamin C, clotrimazole-betamethasone, gabapentin, and predniSONE. I am also having him maintain his cetirizine, Vitamin D, dexlansoprazole, TURMERIC PO, ferrous sulfate, and famotidine.  No orders of the defined types were placed in this encounter.    Follow-up: Return in about 6 months (around 01/11/2022).  Sanda Linger, MD

## 2021-07-24 IMAGING — DX DG KNEE STANDING AP BILAT
1 series · 1 of 1 positions shown · non-contrast
Comparison: None.

CLINICAL DATA: Bilateral knee pain

EXAM:
BILATERAL KNEES STANDING - 1 VIEW

[knee ap]
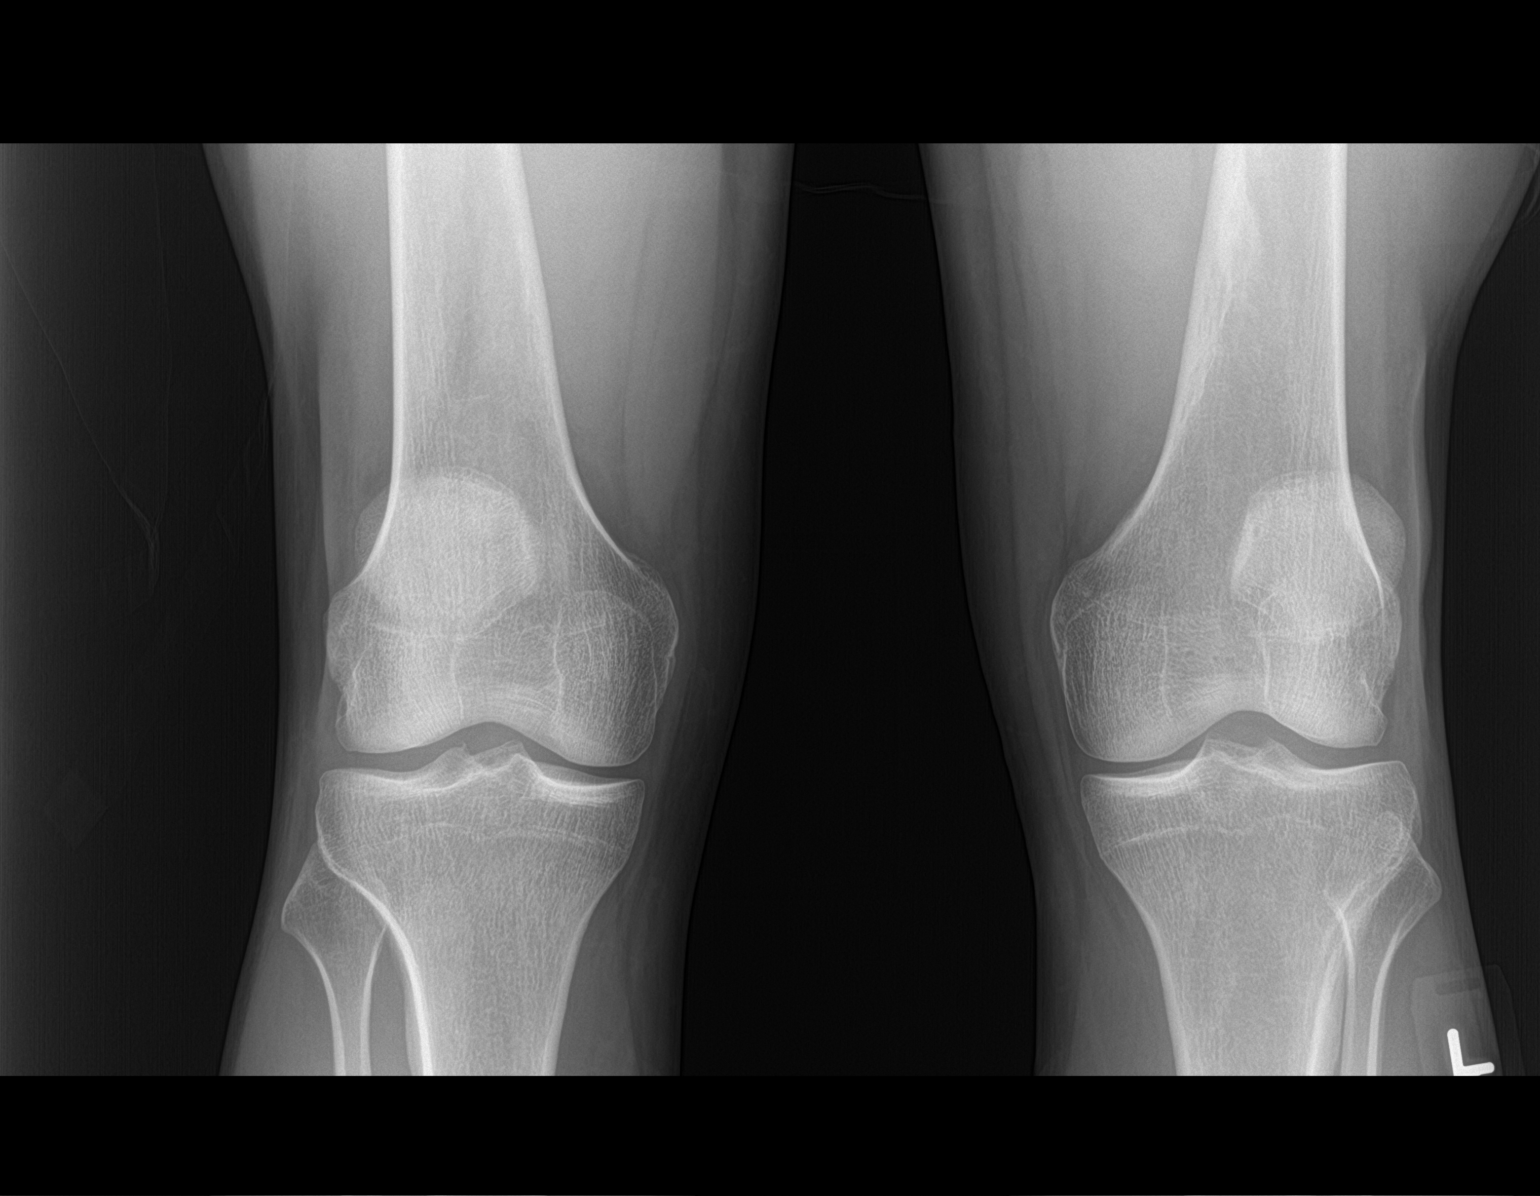

[1 of 1 positions shown; findings below may reference images not displayed]

FINDINGS: There is no acute displaced fracture or dislocation. The joint
spaces are relatively well preserved. There is increased sclerosis
involving the medial distal femoral diaphysis on the left. This is
favored to represent an involuting nonossifying fibroma.
IMPRESSION: 1. No acute osseous abnormality.
2. The joint spaces are relatively well preserved.

## 2021-08-06 ENCOUNTER — Telehealth: Payer: Self-pay | Admitting: Internal Medicine

## 2021-08-06 MED ORDER — DEXLANSOPRAZOLE 60 MG PO CPDR: 60.0000 mg | DELAYED_RELEASE_CAPSULE | Freq: Every day | ORAL | 3 refills | Status: DC

## 2021-08-06 NOTE — Telephone Encounter (Signed)
Inbound call from patient requesting a written prescription for medication refill for Dexilant 60mg  capsule. 3 refills and 100 quantity as last time. States he have to scan it to the system for Pharmacy

## 2021-08-06 NOTE — Telephone Encounter (Signed)
Patient informed that rx up front for pick up. He hopes to come by later today to pick.

## 2021-08-19 ENCOUNTER — Encounter: Payer: Self-pay | Admitting: Internal Medicine

## 2021-08-19 ENCOUNTER — Other Ambulatory Visit: Payer: Self-pay | Admitting: Internal Medicine

## 2021-08-19 DIAGNOSIS — G44209 Tension-type headache, unspecified, not intractable: Secondary | ICD-10-CM

## 2021-08-19 MED ORDER — MIRTAZAPINE 7.5 MG PO TABS: 7.5000 mg | ORAL_TABLET | Freq: Every day | ORAL | 1 refills | Status: DC

## 2021-11-16 ENCOUNTER — Telehealth: Payer: Self-pay | Admitting: Internal Medicine

## 2021-11-16 MED ORDER — FAMOTIDINE 20 MG PO TABS: ORAL_TABLET | ORAL | 0 refills | Status: DC

## 2021-11-16 NOTE — Telephone Encounter (Signed)
Patient needs refill on famotidine.  I will do this for 90 days.  He was advised to call for an appointment as his been more than 2 years since I have seen him.

## 2021-12-15 ENCOUNTER — Ambulatory Visit: Payer: 59 | Admitting: Internal Medicine

## 2021-12-15 ENCOUNTER — Encounter: Payer: Self-pay | Admitting: Internal Medicine

## 2021-12-15 VITALS — BP 120/78 | HR 76 | Ht 69.0 in | Wt 155.0 lb

## 2021-12-15 DIAGNOSIS — R12 Heartburn: Secondary | ICD-10-CM | POA: Diagnosis not present

## 2021-12-15 DIAGNOSIS — K219 Gastro-esophageal reflux disease without esophagitis: Secondary | ICD-10-CM | POA: Diagnosis not present

## 2021-12-15 NOTE — Progress Notes (Signed)
Bernard [name] 35 y.o. 1986-08-08 [id number]  Assessment & Plan:   Encounter Diagnoses  Name Primary?   Gastroesophageal reflux disease, unspecified whether esophagitis present Yes   Functional heartburn - ?   Continue current therapy with PPI  Continue   Subjective:   Chief Complaint: Follow-up of reflux  HPI 36 year old man with GERD problems here for follow-up.  24-hour pH impedance study December 2020 with some weakly acid reflux, no significant acidic reflux and symptoms did not correlate well with reflux events.  Preprocedure manometry with some ineffective esophageal motility though it was thought there was adequate reserve function.  He EGD earlier in 2020 with some fundic gland polyps and benign gastric biopsies.  No signs of esophagitis.  Right now he is tolerating things on twice daily Pepcid and daily 60 mg Dexilant which she obtains through [place].  He was last seen in late 2020.  He is not having any dysphagia or complications.  We have tried treatment for functional heartburn with neuromodulators and he has tried to wean medications when he has had to go off the Exelon and moved to other PPIs due to formulary issues he has suffered with heartburn quite a bit.  Sometimes he still takes antacids now.  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 Allergies  Allergen Reactions   Mold Extract [Trichophyton]     Sneezing, runny nose.    Current Meds  Medication Sig   cetirizine (ZYRTEC) 10 MG tablet Take 10 mg by mouth every morning.    dexlansoprazole (DEXILANT) 60 MG capsule Take 1 capsule (60 mg total) by mouth daily.   famotidine (PEPCID) 20 MG tablet Take 1 tablet (20 mg) by mouth twice daily.   mirtazapine (REMERON) 7.5 MG tablet Take 1 tablet (7.5 mg total) by mouth  at bedtime.   TURMERIC PO Take by mouth.   [DISCONTINUED] Cholecalciferol (VITAMIN D) 50 MCG (2000 UT) CAPS Take 1 capsule by mouth daily.   Past Medical History:  Diagnosis Date   Fundic gland polyposis of stomach    GERD (gastroesophageal reflux disease)    Hemorrhoid    Sebaceous cyst    Past Surgical History:  Procedure Laterality Date   ESOPHAGEAL MANOMETRY N/A 10/09/2019   Procedure: ESOPHAGEAL MANOMETRY (EM);  Surgeon: Iva Boop, MD;  Location: [facility];  Service: Endoscopy;  Laterality: N/A;   ESOPHAGOGASTRODUODENOSCOPY N/A 05/18/2015   Procedure: ESOPHAGOGASTRODUODENOSCOPY (EGD);  Surgeon: Louis Meckel, MD;  Location: [facility];  Service: Endoscopy;  Laterality: N/A;   PH IMPEDANCE STUDY  10/09/2019   Procedure: PH IMPEDANCE STUDY;  Surgeon: Iva Boop, MD;  Location: [facility];  Service: Endoscopy;;   WISDOM TOOTH EXTRACTION     Social History   Social History Narrative   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 Tora Perches stepson   Very occasional alcohol he is a former smoker   No drug use   family history includes Breast cancer in his maternal grandmother; GER disease in his father; Heart disease in his father; Hypertension in his father.   Review of Systems As per HPI  Objective:   Physical Exam BP 120/78    Pulse 76    Ht 5\' 9"  (1.753 m)    Wt 155  lb (70.3 kg)    SpO2 97%    BMI 22.89 kg/m

## 2021-12-15 NOTE — Patient Instructions (Signed)
Follow -up in 2 years sooner if needed.   If you are age 37 or older, your body mass index should be between 23-30. Your Body mass index is 22.89 kg/m. If this is out of the aforementioned range listed, please consider follow up with your Primary Care Provider.  If you are age 72 or younger, your body mass index should be between 19-25. Your Body mass index is 22.89 kg/m. If this is out of the aformentioned range listed, please consider follow up with your Primary Care Provider.   ________________________________________________________  [facility] providers would like to encourage you to use [facility] to communicate with providers for non-urgent requests or questions.  Due to long hold times on the telephone, sending your provider a message by [facility] may be a faster and more efficient way to get a response.  Please allow 48 business hours for a response.  Please remember that this is for non-urgent requests.  _______________________________________________________  Thank you for choosing me and [facility].  Dr. Leone Payor

## 2022-01-16 ENCOUNTER — Encounter: Payer: Self-pay | Admitting: Internal Medicine

## 2022-01-17 ENCOUNTER — Other Ambulatory Visit: Payer: Self-pay | Admitting: Internal Medicine

## 2022-01-17 DIAGNOSIS — G44209 Tension-type headache, unspecified, not intractable: Secondary | ICD-10-CM

## 2022-01-17 MED ORDER — MIRTAZAPINE 15 MG PO TABS: 15.0000 mg | ORAL_TABLET | Freq: Every day | ORAL | 1 refills | Status: DC

## 2022-01-18 ENCOUNTER — Other Ambulatory Visit: Payer: Self-pay | Admitting: Internal Medicine

## 2022-01-26 ENCOUNTER — Other Ambulatory Visit: Payer: Self-pay | Admitting: Internal Medicine

## 2022-02-01 ENCOUNTER — Encounter: Payer: Self-pay | Admitting: Internal Medicine

## 2022-02-01 ENCOUNTER — Ambulatory Visit: Payer: 59 | Admitting: Internal Medicine

## 2022-02-01 VITALS — BP 110/74 | HR 66 | Temp 98.2°F | Ht 69.0 in | Wt 154.0 lb

## 2022-02-01 DIAGNOSIS — G44221 Chronic tension-type headache, intractable: Secondary | ICD-10-CM | POA: Insufficient documentation

## 2022-02-01 MED ORDER — AMITRIPTYLINE HCL 10 MG PO TABS: 10.0000 mg | ORAL_TABLET | Freq: Every day | ORAL | 0 refills | Status: DC

## 2022-02-01 MED ORDER — MIRTAZAPINE 7.5 MG PO TABS: 7.5000 mg | ORAL_TABLET | Freq: Every day | ORAL | 0 refills | Status: DC

## 2022-02-01 NOTE — Progress Notes (Signed)
? ?Subjective:  ?Patient ID: Bernard [name], male    DOB: 1986-04-09  Age: 36 y.o. MRN: [id number] ? ?CC: Headache ? ? ?HPI ?Bernard [name] presents for f/up -  ? ?He has had intermittent headaches for at least 3 years.  He describes it as a pressure in the temporal regions.  He says the headaches are triggered by looking at screens on cell phones and computers.  He says caffeine helps some with the headaches.  He has been taking Remeron for the headaches but he says it is not very effective and he would like to start lowering the dose.  The headaches are intermittent but becoming more frequent.  He denies changes in his vision/hearing or paresthesias. ? ?Outpatient Medications Prior to Visit  ?Medication Sig Dispense Refill  ? cetirizine (ZYRTEC) 10 MG tablet Take 10 mg by mouth every morning.     ? dexlansoprazole (DEXILANT) 60 MG capsule Take 1 capsule (60 mg total) by mouth daily. 100 capsule 3  ? famotidine (PEPCID) 20 MG tablet Take 1 tablet (20 mg) by mouth twice daily. 180 tablet 0  ? TURMERIC PO Take by mouth.    ? mirtazapine (REMERON) 15 MG tablet Take 1 tablet (15 mg total) by mouth at bedtime. 90 tablet 1  ? ?No facility-administered medications prior to visit.  ? ? ?ROS ?Review of Systems  ?Constitutional: Negative.   ?HENT: Negative.    ?Eyes:  Negative for photophobia and visual disturbance.  ?Respiratory:  Negative for cough, chest tightness, shortness of breath and wheezing.   ?Cardiovascular:  Negative for chest pain, palpitations and leg swelling.  ?Gastrointestinal:  Negative for abdominal pain, diarrhea, nausea and vomiting.  ?Endocrine: Negative.   ?Genitourinary: Negative.   ?Musculoskeletal: Negative.   ?Skin: Negative.  Negative for color change.  ?Neurological:  Positive for headaches. Negative for dizziness, weakness, light-headedness and numbness.  ?Hematological:  Negative for adenopathy. Does not bruise/bleed easily.  ?Psychiatric/Behavioral: Negative.    ? ?Objective:  ?BP 110/74  (BP Location: Left Arm, Patient Position: Sitting, Cuff Size: Large)   Pulse 66   Temp 98.2 ?F (36.8 ?C) (Oral)   Ht 5\' 9"  (1.753 m)   Wt 154 lb (69.9 kg)   SpO2 98%   BMI 22.74 kg/m?  ? ?BP Readings from Last 3 Encounters:  ?02/01/22 110/74  ?12/15/21 120/78  ?07/14/21 114/72  ? ? ?Wt Readings from Last 3 Encounters:  ?02/01/22 154 lb (69.9 kg)  ?12/15/21 155 lb (70.3 kg)  ?07/14/21 154 lb (69.9 kg)  ? ? ?Physical Exam ?Vitals reviewed.  ?Constitutional:   ?   Appearance: He is not ill-appearing.  ?HENT:  ?   Nose: Nose normal.  ?   Mouth/Throat:  ?   Mouth: Mucous membranes are moist.  ?Eyes:  ?   General: No scleral icterus. ?   Extraocular Movements: Extraocular movements intact.  ?   Pupils: Pupils are equal, round, and reactive to light.  ?Cardiovascular:  ?   Rate and Rhythm: Normal rate and regular rhythm.  ?   Heart sounds: No murmur heard. ?Pulmonary:  ?   Effort: Pulmonary effort is normal.  ?   Breath sounds: No stridor. No wheezing, rhonchi or rales.  ?Abdominal:  ?   General: There is no distension.  ?   Palpations: Abdomen is soft. There is no mass.  ?   Tenderness: There is no abdominal tenderness. There is no guarding.  ?Musculoskeletal:     ?   General: No  swelling. Normal range of motion.  ?   Cervical back: Normal range of motion and neck supple.  ?Skin: ?   General: Skin is warm and dry.  ?Neurological:  ?   General: No focal deficit present.  ?   Mental Status: He is alert and oriented to person, place, and time. Mental status is at baseline.  ?   Cranial Nerves: Cranial nerves 2-12 are intact.  ?   Sensory: Sensation is intact.  ?   Motor: Motor function is intact.  ?   Coordination: Coordination is intact.  ?   Gait: Gait is intact.  ?   Deep Tendon Reflexes: Reflexes normal. Babinski sign absent on the right side. Babinski sign absent on the left side.  ?   Reflex Scores: ?     Tricep reflexes are 1+ on the right side and 1+ on the left side. ?     Bicep reflexes are 1+ on the right  side and 1+ on the left side. ?     Brachioradialis reflexes are 1+ on the right side and 1+ on the left side. ?     Patellar reflexes are 1+ on the right side and 1+ on the left side. ?     Achilles reflexes are 1+ on the right side and 1+ on the left side. ? ? ?Lab Results  ?Component Value Date  ? WBC 7.5 07/14/2021  ? HGB 14.5 07/14/2021  ? HCT 42.5 07/14/2021  ? PLT 267.0 07/14/2021  ? GLUCOSE 64 (L) 07/14/2021  ? CHOL 137 09/17/2019  ? TRIG 115.0 09/17/2019  ? HDL 47.60 09/17/2019  ? [place] 66 09/17/2019  ? ALT 21 07/14/2021  ? AST 17 07/14/2021  ? NA 141 07/14/2021  ? K 3.6 07/14/2021  ? CL 104 07/14/2021  ? CREATININE 0.96 07/14/2021  ? BUN 19 07/14/2021  ? CO2 30 07/14/2021  ? TSH 1.00 07/14/2021  ? HGBA1C 5.6 02/28/2017  ? ? ?No results found. ? ?Assessment & Plan:  ? ?Bernard [name] was seen today for headache. ? ?Diagnoses and all orders for this visit: ? ?Chronic tension-type headache, intractable- There are no alarming features about the headaches.  Will taper down on mirtazapine as requested and will start amitriptyline at a low dose. ?-     mirtazapine (REMERON) 7.5 MG tablet; Take 1 tablet (7.5 mg total) by mouth at bedtime. ?-     amitriptyline (ELAVIL) 10 MG tablet; Take 1 tablet (10 mg total) by mouth at bedtime. ? ? ?I have discontinued Bernard [name]'s mirtazapine. I am also having him start on mirtazapine and amitriptyline. Additionally, I am having him maintain his cetirizine, TURMERIC PO, dexlansoprazole, and famotidine. ? ?Meds ordered this encounter  ?Medications  ? mirtazapine (REMERON) 7.5 MG tablet  ?  Sig: Take 1 tablet (7.5 mg total) by mouth at bedtime.  ?  Dispense:  90 tablet  ?  Refill:  0  ? amitriptyline (ELAVIL) 10 MG tablet  ?  Sig: Take 1 tablet (10 mg total) by mouth at bedtime.  ?  Dispense:  90 tablet  ?  Refill:  0  ? ? ? ?Follow-up: Return in about 6 months (around 08/03/2022). ? ?Scarlette Calico, MD ?

## 2022-02-01 NOTE — Patient Instructions (Signed)
Tension Headache, Adult ?A tension headache is a feeling of pain, pressure, or aching over the front and sides of the head. The pain can be dull, or it can feel tight. There are two types of tension headache: ?Episodic tension headache. This is when the headaches happen fewer than 15 days a month. ?Chronic tension headache. This is when the headaches happen more than 15 days a month during a 3-month period. ?A tension headache can last from 30 minutes to several days. It is the most common kind of headache. Tension headaches are not normally associated with nausea or vomiting, and they do not get worse with physical activity. ?What are the causes? ?The exact cause of this condition is not known. Tension headaches are often triggered by stress, anxiety, or depression. Other triggers may include: ?Alcohol. ?Too much caffeine or caffeine withdrawal. ?Respiratory infections, such as colds, flu, or sinus infections. ?Dental problems or teeth clenching. ?Fatigue. ?Holding your head and neck in the same position for a long period of time, such as while using a computer. ?Smoking. ?Arthritis of the neck. ?What are the signs or symptoms? ?Symptoms of this condition include: ?A feeling of pressure or tightness around the head. ?Dull, aching head pain. ?Pain over the front and sides of the head. ?Tenderness in the muscles of the head, neck, and shoulders. ?How is this diagnosed? ?This condition may be diagnosed based on your symptoms, your medical history, and a physical exam. ?If your symptoms are severe or unusual, you may have imaging tests, such as a CT scan or an MRI of your head. Your vision may also be checked. ?How is this treated? ?This condition may be treated with lifestyle changes and with medicines that help relieve symptoms. ?Follow these instructions at home: ?Managing pain ?Take over-the-counter and prescription medicines only as told by your health care provider. ?When you have a headache, lie down in a dark,  quiet room. ?If directed, put ice on your head and neck. To do this: ?Put ice in a plastic bag. ?Place a towel between your skin and the bag. ?Leave the ice on for 20 minutes, 2-3 times a day. ?Remove the ice if your skin turns bright red. This is very important. If you cannot feel pain, heat, or cold, you have a greater risk of damage to the area. ?If directed, apply heat to the back of your neck as often as told by your health care provider. Use the heat source that your health care provider recommends, such as a moist heat pack or a heating pad. ?Place a towel between your skin and the heat source. ?Leave the heat on for 20-30 minutes. ?Remove the heat if your skin turns bright red. This is especially important if you are unable to feel pain, heat, or cold. You have a greater risk of getting burned. ?Eating and drinking ?Eat meals on a regular schedule. ?If you drink alcohol: ?Limit how much you have to: ?0-1 drink a day for women who are not pregnant. ?0-2 drinks a day for men. ?Know how much alcohol is in your drink. In the [place] one drink equals one 12 oz bottle of beer (355 mL), one 5 oz glass of wine (148 mL), or one 1? oz glass of hard liquor (44 mL). ?Drink enough fluid to keep your urine pale yellow. ?Decrease your caffeine intake, or stop using caffeine. ?Lifestyle ?Get 7-9 hours of sleep each night, or get the amount of sleep recommended by your health care provider. ?At bedtime,   remove computers, phones, and tablets from your room. ?Find ways to manage your stress. This may include: ?Exercise. ?Deep breathing exercises. ?Yoga. ?Listening to music. ?Positive mental imagery. ?Try to sit up straight and avoid tensing your muscles. ?Do not use any products that contain nicotine or tobacco. These include cigarettes, chewing tobacco, and vaping devices, such as e-cigarettes. If you need help quitting, ask your health care provider. ?General instructions ? ?Avoid any headache triggers. Keep a journal to help  find out what may trigger your headaches. For example, write down: ?What you eat and drink. ?How much sleep you get. ?Any change to your diet or medicines. ?Keep all follow-up visits. This is important. ?Contact a health care provider if: ?Your headache does not get better. ?Your headache comes back. ?You are sensitive to sounds, light, or smells because of a headache. ?You have nausea or you vomit. ?Your stomach hurts. ?Get help right away if: ?You suddenly develop a severe headache, along with any of the following: ?A stiff neck. ?Nausea and vomiting. ?Confusion. ?Weakness in one part or one side of your body. ?Double vision or loss of vision. ?Shortness of breath. ?Rash. ?Unusual sleepiness. ?Fever or chills. ?Trouble speaking. ?Pain in your eye or ear. ?Trouble walking or balancing. ?Feeling faint or passing out. ?Summary ?A tension headache is a feeling of pain, pressure, or aching over the front and sides of the head. ?A tension headache can last from 30 minutes to several days. It is the most common kind of headache. ?This condition may be diagnosed based on your symptoms, your medical history, and a physical exam. ?This condition may be treated with lifestyle changes and with medicines that help relieve symptoms. ?This information is not intended to replace advice given to you by your health care provider. Make sure you discuss any questions you have with your health care provider. ?Document Revised: 07/09/2020 Document Reviewed: 07/09/2020 ?Elsevier Patient Education ? 2022 [employer] ? ?

## 2022-02-04 ENCOUNTER — Other Ambulatory Visit: Payer: Self-pay | Admitting: Internal Medicine

## 2022-02-14 ENCOUNTER — Telehealth: Payer: Self-pay | Admitting: Internal Medicine

## 2022-02-14 DIAGNOSIS — K649 Unspecified hemorrhoids: Secondary | ICD-10-CM

## 2022-02-14 NOTE — Telephone Encounter (Signed)
Patient called to schedule an appointment for hemorrhoids. Dr. Leone Payor next available OV 03/01/22, patient declined to schedule. Per patient, has had issues with hemorroids for 9 days. Patient would like a call from a nurse to advise. ?

## 2022-02-15 MED ORDER — HYDROCORTISONE (PERIANAL) 2.5 % EX CREA
TOPICAL_CREAM | Freq: Two times a day (BID) | CUTANEOUS | 0 refills | Status: DC | PRN
Start: 2022-02-15 — End: 2022-08-02

## 2022-02-15 NOTE — Telephone Encounter (Signed)
Pt stated that he has been having discomfort with hemorrhoids for about 9 days: Pt states that he has been using which hazel pads, OTC Hemorrhoid cream, Taking warm baths. Pt states that this are not working and  requesting a Prescription Cream to assist with the discomfort: Pt recent office visit on 11/25/2021  ?Please advise  ?

## 2022-02-15 NOTE — Telephone Encounter (Signed)
I pended some prescription cream.  I was not sure which pharmacy he wanted to go to so please check on that. ? ?The other thing he can do is some 5% lidocaine (generic for RectiCare). ? ? ?

## 2022-02-15 NOTE — Telephone Encounter (Signed)
Pt was made aware of Dr. Leone Payor recommendations: Prescription sent to pharmacy that pt requested: ?Pt also given the recommendation for 5% lidocaine (generic for RectiCare). ?Pt verbalized understanding with all questions answered.  ? ?

## 2022-02-22 ENCOUNTER — Encounter: Payer: Self-pay | Admitting: Internal Medicine

## 2022-02-25 ENCOUNTER — Encounter: Payer: Self-pay | Admitting: Internal Medicine

## 2022-02-25 ENCOUNTER — Other Ambulatory Visit: Payer: Self-pay | Admitting: Internal Medicine

## 2022-02-25 DIAGNOSIS — G44209 Tension-type headache, unspecified, not intractable: Secondary | ICD-10-CM

## 2022-02-25 DIAGNOSIS — G44221 Chronic tension-type headache, intractable: Secondary | ICD-10-CM

## 2022-02-25 MED ORDER — AMITRIPTYLINE HCL 10 MG PO TABS: 20.0000 mg | ORAL_TABLET | Freq: Every day | ORAL | 1 refills | Status: DC

## 2022-02-25 NOTE — Telephone Encounter (Signed)
Pt was scheduled on 03/01/2022 at 8:30 AM to see Dr. Carlean Purl: Pt made aware ?Pt verbalized understanding with all questions answered.  ? ?

## 2022-03-01 ENCOUNTER — Encounter: Payer: Self-pay | Admitting: Internal Medicine

## 2022-03-01 ENCOUNTER — Ambulatory Visit: Payer: 59 | Admitting: Internal Medicine

## 2022-03-01 VITALS — BP 110/70 | HR 82 | Ht 69.0 in | Wt 152.0 lb

## 2022-03-01 DIAGNOSIS — K641 Second degree hemorrhoids: Secondary | ICD-10-CM | POA: Diagnosis not present

## 2022-03-01 NOTE — Patient Instructions (Signed)
HEMORRHOID BANDING PROCEDURE  ? ? FOLLOW-UP CARE ? ? ?The procedure you have had should have been relatively painless since the banding of the area involved does not have nerve endings and there is no pain sensation.  The rubber band cuts off the blood supply to the hemorrhoid and the band may fall off as soon as 48 hours after the banding (the band may occasionally be seen in the toilet bowl following a bowel movement). You may notice a temporary feeling of fullness in the rectum which should respond adequately to plain Tylenol? or Motrin?. ? ?Following the banding, avoid strenuous exercise that evening and resume full activity the next day.  A sitz bath (soaking in a warm tub) or bidet is soothing, and can be useful for cleansing the area after bowel movements.   ? ? ?To avoid constipation, take two tablespoons of natural wheat bran, natural oat bran, flax, Benefiber? or any over the counter fiber supplement and increase your water intake to 7-8 glasses daily.   ? ?Unless you have been prescribed anorectal medication, do not put anything inside your rectum for two weeks: No suppositories, enemas, fingers, etc. ? ?Occasionally, you may have more bleeding than usual after the banding procedure.  This is often from the untreated hemorrhoids rather than the treated one.  Don?t be concerned if there is a tablespoon or so of blood.  If there is more blood than this, lie flat with your bottom higher than your head and apply an ice pack to the area. If the bleeding does not stop within a half an hour or if you feel faint, call our office at [phone] or go to the emergency room. ? ?Problems are not common; however, if there is a substantial amount of bleeding, severe pain, chills, fever or difficulty passing urine (very rare) or other problems, you should call us at [phone] or report to the nearest emergency room. ? ?Do not stay seated continuously for more than 2-3 hours for a day or two after the procedure.   Tighten your buttock muscles 10-15 times every two hours and take 10-15 deep breaths every 1-2 hours.  Do not spend more than a few minutes on the toilet if you cannot empty your bowel; instead re-visit the toilet at a later time. ? ? ?Stop using your hydrocortisone cream. Continue your fiber. ? ? ?I appreciate the opportunity to care for you. ?Silvano Rusk, MD, [facility] ?

## 2022-03-01 NOTE — Assessment & Plan Note (Addendum)
We discussed the pros and cons of banding today and he agreed to proceed.  The right anterior hemorrhoid was banded and he will return in 2 months for follow-up if still symptomatic he will cancel otherwise.  He will continue fiber supplementation but stop using hydrocortisone. ?

## 2022-03-01 NOTE — Progress Notes (Signed)
? ?Bernard [name] 35 y.o. [birth year] [id number] ? ?Assessment & Plan:  ?Prolapsed internal hemorrhoids, grade 2 ?We discussed the pros and cons of banding today and he agreed to proceed.  The right anterior hemorrhoid was banded and he will return in 2 months for follow-up if still symptomatic he will cancel otherwise.  He will continue fiber supplementation but stop using hydrocortisone. ? ? ? ? ? ?Subjective:  ? ?Chief Complaint: Hemorrhoids ? ?HPI ?The patient is here with complaints of recurrent hemorrhoidal problems.  He has an anal pressure/rectal pressure itching intermittently.  This comes and goes its really been a problem off and on for years.  He is taking fiber supplementation and having soft stools.  It is quite a nuisance.  When he messaged/called in I thought there was a component of pain so I was wondering about a fissure but in person today there is no pain with or after defecation.  He has had these problems off and on for years he is trying to cycle more and its become a nuisance.  He has tried hydrocortisone cream but the anal applicator traumatized things a bit so he has not been inserting it to treat.  I had called that in. ?Allergies  ?Allergen Reactions  ? Mold Extract [Trichophyton]   ?  Sneezing, runny nose.   ? ?Current Meds  ?Medication Sig  ? amitriptyline (ELAVIL) 10 MG tablet Take 2 tablets (20 mg total) by mouth at bedtime.  ? cetirizine (ZYRTEC) 10 MG tablet Take 10 mg by mouth every morning.   ? dexlansoprazole (DEXILANT) 60 MG capsule Take 1 capsule (60 mg total) by mouth daily.  ? famotidine (PEPCID) 20 MG tablet Take 1 tablet by mouth twice daily.  ? hydrocortisone (ANUSOL-HC) 2.5 % rectal cream Place rectally 2 (two) times daily as needed for hemorrhoids.  ? mirtazapine (REMERON) 7.5 MG tablet Take 1 tablet (7.5 mg total) by mouth at bedtime.  ? psyllium (METAMUCIL) 58.6 % packet Take 1 packet by mouth daily.  ? TURMERIC PO Take by mouth.  ? ?Past Medical History:   ?Diagnosis Date  ? Fundic gland polyposis of stomach   ? GERD (gastroesophageal reflux disease)   ? Hemorrhoid   ? Sebaceous cyst   ? ?Past Surgical History:  ?Procedure Laterality Date  ? ESOPHAGEAL MANOMETRY N/A 10/09/2019  ? Procedure: ESOPHAGEAL MANOMETRY (EM);  Surgeon: Iva Boop, MD;  [facility]: [facility];  Service: Endoscopy;  Laterality: N/A;  ? ESOPHAGOGASTRODUODENOSCOPY N/A 05/18/2015  ? Procedure: ESOPHAGOGASTRODUODENOSCOPY (EGD);  Surgeon: Louis Meckel, MD;  [facility];  Service: Endoscopy;  Laterality: N/A;  ? PH IMPEDANCE STUDY  10/09/2019  ? Procedure: PH IMPEDANCE STUDY;  Surgeon: Iva Boop, MD;  Location: [facility];  Service: Endoscopy;;  ? WISDOM TOOTH EXTRACTION    ? ?Social History  ? ?Social History Narrative  ? 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 Dr. Tora Perches stepson  ? Very occasional alcohol he is a former smoker  ? No drug use  ? ?family history includes Breast cancer in his maternal grandmother; GER disease in his father; Heart disease in his father; Hypertension in his father. ? ? ?Review of Systems ?As per HPI ? ?Objective:  ? Physical Exam ?BP 110/70   Pulse 82   Ht 5\' 9"  (1.753 m)   Wt 152 lb (68.9 kg)   BMI 22.45 kg/m?  ?NAD ? ?Rectal  ?Anus - sl hypopigmentation of skin  prolapse RA ?DRE - palpable hemorrhoids, no mass and nontender ? ?Anoscopy - Gr 2 internal hemorrhoid RA, others Gr 1 ? ?PROCEDURE NOTE: ?The patient presents with symptomatic grade 2  hemorrhoids, requesting rubber band ligation of his/her hemorrhoidal disease.  All risks, benefits and alternative forms of therapy were described and informed consent was obtained. ? ? ?The anorectum was pre-medicated with 0.125% NTG and 5% lidocaine ?The decision was made to band the RA internal hemorrhoid, and the [facility] O?Regan System was used to perform band ligation without complication.  ?Digital anorectal examination was then performed to assure proper  positioning of the band, and to adjust the banded tissue as required. ? The patient was discharged home without pain or other issues.  Dietary and behavioral recommendations were given and along with follow-up instructions.   ?  ? ? ?

## 2022-03-05 ENCOUNTER — Encounter: Payer: Self-pay | Admitting: Internal Medicine

## 2022-03-07 NOTE — Telephone Encounter (Signed)
Left message for pt to call back  °

## 2022-03-09 ENCOUNTER — Ambulatory Visit: Payer: 59 | Admitting: Nurse Practitioner

## 2022-03-09 ENCOUNTER — Ambulatory Visit: Payer: 59 | Admitting: Internal Medicine

## 2022-03-09 ENCOUNTER — Encounter: Payer: Self-pay | Admitting: Internal Medicine

## 2022-03-09 VITALS — BP 120/78 | HR 78 | Ht 69.0 in | Wt 153.8 lb

## 2022-03-09 DIAGNOSIS — K641 Second degree hemorrhoids: Secondary | ICD-10-CM

## 2022-03-09 NOTE — Progress Notes (Signed)
? ?Bernard [name] 35 y.o. [birth year] [id number] ? ?Assessment & Plan:  ? ?Encounter Diagnosis  ?Name Primary?  ? Prolapsed internal hemorrhoids, grade 2 Yes  ? ? ?We have decided to give this some time since he is improved.  We will move her follow-up to a month from now rather than the 2 months originally planned.  I do not want to band the right anterior again right now due to the appropriate ulcer and increased risk for bleeding this soon after the banding. ? ?I do not think this was a latex allergy, but we will keep that in mind depending upon clinical course. ? ?Subjective:  ? ?Chief Complaint: Hemorrhoid issues ? ?HPI ?Patient is here for follow-up, he had banding of the right anterior grade 2 prolapsed internal hemorrhoid on May 9.  He reported that the band appeared in the toilet the next day and he had recurrent symptoms of pressure and discomfort and prolapse.  We scheduled this appointment for follow-up to consider additional banding.  However in the last 24 to 36 hours those symptoms have all resolved. ? ? ? ?Allergies  ?Allergen Reactions  ? Mold Extract [Trichophyton]   ?  Sneezing, runny nose.   ? ?Current Meds  ?Medication Sig  ? amitriptyline (ELAVIL) 10 MG tablet Take 2 tablets (20 mg total) by mouth at bedtime.  ? cetirizine (ZYRTEC) 10 MG tablet Take 10 mg by mouth every morning.   ? dexlansoprazole (DEXILANT) 60 MG capsule Take 1 capsule (60 mg total) by mouth daily.  ? famotidine (PEPCID) 20 MG tablet Take 1 tablet by mouth twice daily.  ? hydrocortisone (ANUSOL-HC) 2.5 % rectal cream Place rectally 2 (two) times daily as needed for hemorrhoids.  ? mirtazapine (REMERON) 7.5 MG tablet Take 1 tablet (7.5 mg total) by mouth at bedtime.  ? psyllium (METAMUCIL) 58.6 % packet Take 1 packet by mouth daily.  ? TURMERIC PO Take by mouth.  ? ?Past Medical History:  ?Diagnosis Date  ? Fundic gland polyposis of stomach   ? GERD (gastroesophageal reflux disease)   ? Hemorrhoid   ? Sebaceous cyst    ? ?Past Surgical History:  ?Procedure Laterality Date  ? ESOPHAGEAL MANOMETRY N/A 10/09/2019  ? Procedure: ESOPHAGEAL MANOMETRY (EM);  Surgeon: Iva Boop, MD;  [facility];  Service: Endoscopy;  Laterality: N/A;  ? ESOPHAGOGASTRODUODENOSCOPY N/A 05/18/2015  ? Procedure: ESOPHAGOGASTRODUODENOSCOPY (EGD);  Surgeon: Louis Meckel, MD;  Location[facility];  Service: Endoscopy;  Laterality: N/A;  ? HEMORRHOID BANDING    ? PH IMPEDANCE STUDY  10/09/2019  ? Procedure: PH IMPEDANCE STUDY;  Surgeon: Iva Boop, MD;  [facility];  Service: Endoscopy;;  ? WISDOM TOOTH EXTRACTION    ? ?Social History  ? ?Social History Narrative  ? 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 Dr. Tora Perches stepson  ? Very occasional alcohol he is a former smoker  ? No drug use  ? ?family history includes Breast cancer in his maternal grandmother; GER disease in his father; Heart disease in his father; Hypertension in his father. ? ? ?Review of Systems ?As per HPI ? ?Objective:  ? Physical Exam ?BP 120/78   Pulse 78   Ht 5\' 9"  (1.753 m)   Wt 153 lb 12.8 oz (69.8 kg)   BMI 22.71 kg/m?  ?Rectal exam there is a very slight bulge at the right anterior column seen at the anus.  Minimal with some slight  erythema as it turns into the anal canal.  Nontender no mass. ? ?Anoscopy exam shows an appropriate ulcer on the distal right anterior internal hemorrhoid complex.  There is no significant prolapse at this time.  External hemorrhoids are seen as well. ? ?

## 2022-03-09 NOTE — Patient Instructions (Signed)
If you are age 37 or older, your body mass index should be between 23-30. Your Body mass index is 22.71 kg/m?Marland Kitchen If this is out of the aforementioned range listed, please consider follow up with your Primary Care Provider. ? ?If you are age 74 or younger, your body mass index should be between 19-25. Your Body mass index is 22.71 kg/m?Marland Kitchen If this is out of the aformentioned range listed, please consider follow up with your Primary Care Provider.  ? ?________________________________________________________ ? ?The [facility] providers would like to encourage you to use [facility] to communicate with providers for non-urgent requests or questions.  Due to long hold times on the telephone, sending your provider a message by [facility] may be a faster and more efficient way to get a response.  Please allow 48 business hours for a response.  Please remember that this is for non-urgent requests.  ?_______________________________________________________ ? ?I appreciate the opportunity to care for you. ?Silvano Rusk, MD, [facility] ?

## 2022-03-09 NOTE — Telephone Encounter (Signed)

## 2022-03-14 ENCOUNTER — Other Ambulatory Visit: Payer: Self-pay | Admitting: Internal Medicine

## 2022-03-14 DIAGNOSIS — G44221 Chronic tension-type headache, intractable: Secondary | ICD-10-CM

## 2022-04-05 ENCOUNTER — Encounter: Payer: Self-pay | Admitting: Psychiatry

## 2022-04-05 ENCOUNTER — Ambulatory Visit: Payer: 59 | Admitting: Psychiatry

## 2022-04-05 VITALS — BP 142/83 | HR 63 | Ht 69.0 in | Wt 154.0 lb

## 2022-04-05 DIAGNOSIS — G44221 Chronic tension-type headache, intractable: Secondary | ICD-10-CM

## 2022-04-05 MED ORDER — TOPIRAMATE 25 MG PO TABS: ORAL_TABLET | ORAL | 6 refills | Status: DC

## 2022-04-05 MED ORDER — DICLOFENAC POTASSIUM 50 MG PO TABS
50.0000 mg | ORAL_TABLET | ORAL | 6 refills | Status: DC | PRN
Start: 2022-04-05 — End: 2022-04-07

## 2022-04-05 NOTE — Progress Notes (Signed)
Referring:  Etta Grandchild, MD [address] [place],  [place] [address]  PCP: Etta Grandchild, MD  Neurology was asked to evaluate Bernard [name], a 36 year old male for a chief complaint of headaches.  Our recommendations of care will be communicated by shared medical record.    CC:  headaches  History provided from self  HPI:  Medical co-morbidities: HTN  The patient presents for evaluation of headaches which began 3-5 years ago. They have increased in frequency over the past 6 months. He currently has headaches ~4 days out of the week. Headaches are triggered by looking at computer screens. Previously vaped nicotine which would trigger a similar headache (has stopped vaping). Headaches are associated with photophobia, no phonophobia or nausea. They can be severe enough that he has to lie down and can last for several hours at a time.  Drinks caffeine when he has a headache which does help. 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  Headache History: Onset: 5 years ago Triggers: looking at computer and phone screens, sunlight Aura: no Location: temporal Quality/Description: pressure Associated Symptoms:  Photophobia: yes  Phonophobia: no  Nausea: no Worse with activity?: occasionally Duration of headaches: several hours  Headache days per month: 16 Headache free days per month: 14  Current Treatment: Abortive none  Preventative none  Prior Therapies                                 Amitriptyline 20 mg QHS - drowsiness, lack of efficacy Cymbalta 60 mg daily - lethargy Remeron Tylenol ibuprofen  LABS: CBC    Component Value Date/Time   WBC 7.5 07/14/2021 1447   RBC 4.84 07/14/2021 1447   HGB 14.5 07/14/2021 1447   HCT 42.5 07/14/2021 1447   PLT 267.0 07/14/2021 1447   MCV 87.9 07/14/2021 1447   MCH 29.1 05/01/2015 0525   MCHC 34.1 07/14/2021 1447   RDW 12.9  07/14/2021 1447   LYMPHSABS 1.5 07/14/2021 1447   MONOABS 0.4 07/14/2021 1447   EOSABS 0.1 07/14/2021 1447   BASOSABS 0.0 07/14/2021 1447      Latest Ref Rng & Units 07/14/2021    2:47 PM 08/13/2019    8:26 AM 03/15/2018   12:15 PM  CMP  Glucose 70 - 99 mg/dL 64  97  96   BUN 6 - 23 mg/dL 19  16  19    Creatinine 0.40 - 1.50 mg/dL  3.35  4.56   Sodium 135 - 145 mEq/L 141  139  140   Potassium 3.5 - 5.1 mEq/L 3.6  3.7  4.6   Chloride 96 - 112 mEq/L 104  101  104   CO2 19 - 32 mEq/L 30  30  32   Calcium 8.4 - 10.5 mg/dL 9.2  9.9    9.7  9.8   Total Protein 6.0 - 8.3 g/dL 6.6  7.0  6.8   Total Bilirubin 0.2 - 1.2 mg/dL 0.5  0.6  0.5   Alkaline Phos 39 - 117 U/L 47  49  43   AST 0 - 37 U/L 17  27  31    ALT 0 - 53 U/L 21  29  48      IMAGING:  none  Current Outpatient Medications on File Prior to Visit  Medication Sig Dispense Refill  cetirizine (ZYRTEC) 10 MG tablet Take 10 mg by mouth every morning.      dexlansoprazole (DEXILANT) 60 MG capsule Take 1 capsule (60 mg total) by mouth daily. 100 capsule 3   famotidine (PEPCID) 20 MG tablet Take 1 tablet by mouth twice daily. 180 tablet 3   psyllium (METAMUCIL) 58.6 % packet Take 1 packet by mouth daily.     TURMERIC PO Take by mouth.     amitriptyline (ELAVIL) 10 MG tablet Take 2 tablets (20 mg total) by mouth at bedtime. (Patient not taking: Reported on 04/05/2022) 180 tablet 1   hydrocortisone (ANUSOL-HC) 2.5 % rectal cream Place rectally 2 (two) times daily as needed for hemorrhoids. (Patient not taking: Reported on 04/05/2022) 30 g 0   mirtazapine (REMERON) 7.5 MG tablet Take 1 tablet (7.5 mg total) by mouth at bedtime. (Patient not taking: Reported on 04/05/2022) 90 tablet 0   No current facility-administered medications on file prior to visit.     Allergies: Allergies  Allergen Reactions   Mold Extract [Trichophyton]     Sneezing, runny nose.     Family History: Migraine or other headaches in the family:   no Aneurysms in a first degree relative:  no Brain tumors in the family:  no Other neurological illness in the family:   father has parkinsons  Past Medical History: Past Medical History:  Diagnosis Date   Fundic gland polyposis of stomach    GERD (gastroesophageal reflux disease)    Hemorrhoid    Sebaceous cyst     Past Surgical History Past Surgical History:  Procedure Laterality Date   ESOPHAGEAL MANOMETRY N/A 10/09/2019   Procedure: ESOPHAGEAL MANOMETRY (EM);  Surgeon: Iva BoopGessner, Carl E, MD;  [facility];  Service: Endoscopy;  Laterality: N/A;   ESOPHAGOGASTRODUODENOSCOPY N/A 05/18/2015   Procedure: ESOPHAGOGASTRODUODENOSCOPY (EGD);  Surgeon: Louis Meckelobert D Kaplan, MD;  [facility];  Service: Endoscopy;  Laterality: N/A;   HEMORRHOID BANDING     PH IMPEDANCE STUDY  10/09/2019   Procedure: PH IMPEDANCE STUDY;  Surgeon: Iva BoopGessner, Carl E, MD;  Location: [facility];  Service: Endoscopy;;   WISDOM TOOTH EXTRACTION      Social History: Social History   Tobacco Use   Smoking status: Former    Packs/day: 1.00    Years: 10.00    Total pack years: 10.00    Types: E-cigarettes, Cigarettes    Quit date: 11/24/2012    Years since quitting: 9.3   Smokeless tobacco: Never   Tobacco comments:    quit e cigarette 2016  Vaping Use   Vaping Use: Former  Substance Use Topics   Alcohol use: Yes    Alcohol/week: 0.0 standard drinks of alcohol    Comment: occasional   Drug use: No     ROS: Negative for fevers, chills. Positive for headaches. All other systems reviewed and negative unless stated otherwise in HPI.   Physical Exam:   Vital Signs: BP (!) 142/83   Pulse 63   Ht 5\' 9"  (1.753 m)   Wt 154 lb (69.9 kg)   BMI 22.74 kg/m  GENERAL: well appearing,in no acute distress,alert SKIN:  Color, texture, turgor normal. No rashes or lesions HEAD:  Normocephalic/atraumatic. CV:  RRR RESP: Normal respiratory effort MSK: no tenderness to palpation over occiput,  neck, or shoulders  NEUROLOGICAL: Mental Status: Alert, oriented to person, place and time,Follows commands Cranial Nerves: PERRL, visual fields intact to confrontation, extraocular movements intact, facial sensation intact, no facial droop or ptosis, hearing grossly  intact, no dysarthria Motor: muscle strength 5/5 both upper and lower extremities,no drift, normal tone Reflexes: 2+ throughout Sensation: intact to light touch all 4 extremities Coordination: Finger-to- nose-finger intact bilaterally Gait: normal-based   IMPRESSION: 36 year old male with a history of HTN who presents for evaluation of headaches. His headache has mixed features. Does not have phonophobia or nausea, but significant photophobia and severity of headache do raise suspicion for migraine. Will start Topamax for headache prevention and diclofenac for rescue, as these medications can treat both migraine and tension type headache.  PLAN: -Prevention: Start Topamax 25 mg QHS, increase by 25 mg weekly up to 100 mg QHS -Rescue: Start diclofenac 50-100 mg PRN -next steps: consider zonisamide if side effects with Topamax  I spent a total of 30 minutes chart reviewing and counseling the patient. Headache education was done. Discussed treatment options including preventive and acute medications, and natural supplements. Discussed medication side effects, adverse reactions and drug interactions. Written educational materials and patient instructions outlining all of the above were given.  Follow-up: 5 months   Ocie Doyne, MD 04/05/2022   10:50 AM

## 2022-04-05 NOTE — Patient Instructions (Addendum)
Start Topamax for headache prevention. Take 25 mg (1 pill) at bedtime for one week, then increase to 50 mg (2 pills) at bedtime for one week, then take 75 mg (3 pills) at bedtime for one week, then take 100 mg (4 pills) at bedtime  Start diclofenac 50-100 mg (1-2 pills) as needed for headaches  Natural supplements that can reduce headaches: Magnesium Oxide or Magnesium Glycinate 500 mg at bed (up to 800 mg daily) Coenzyme Q10 300 mg in AM Vitamin B2- 200 mg twice a day  Add 1 supplement at a time since even natural supplements can have undesirable side effects. You can sometimes buy supplements cheaper (especially Coenzyme Q10) at www.WebmailGuide.co.za or at [employer].  Magnesium: Magnesium (250 mg twice a day or 500 mg at bed) has a relaxant effect on smooth muscles such as blood vessels. Individuals suffering from frequent or daily headache usually have low magnesium levels which can be increase with daily supplementation of 400-800 mg. Three trials found 40-90% average headache reduction  when used as a preventative. Magnesium also demonstrated the benefit in menstrually related migraine.  Magnesium is part of the messenger system in the serotonin cascade and it is a good muscle relaxant.  It is also useful for constipation. Good sources include nuts, whole grains, and tomatoes. Side Effects: loose stool/diarrhea  Riboflavin (vitamin B 2) 200 mg twice a day. This vitamin assists nerve cells in the production of ATP a principal energy storing molecule.  It is necessary for many chemical reactions in the body.  There have been at least 3 clinical trials of riboflavin using 400 mg per day all of which suggested that migraine frequency can be decreased.  All 3 trials showed significant improvement in over half of migraine sufferers.  The supplement is found in bread, cereal, milk, meat, and poultry.  Most Americans get more riboflavin than the recommended daily allowance, however riboflavin deficiency is not  necessary for the supplements to help prevent headache. Side effects: energizing, green urine  Coenzyme Q10: This is present in almost all cells in the body and is critical component for the conversion of energy.  Recent studies have shown that a nutritional supplement of CoQ10 can reduce the frequency of migraine attacks by improving the energy production of cells as with riboflavin.  Doses of 150 mg twice a day have been shown to be effective.  -

## 2022-04-07 ENCOUNTER — Encounter: Payer: Self-pay | Admitting: Psychiatry

## 2022-04-07 ENCOUNTER — Other Ambulatory Visit: Payer: Self-pay | Admitting: Psychiatry

## 2022-04-07 ENCOUNTER — Telehealth: Payer: Self-pay | Admitting: Psychiatry

## 2022-04-07 MED ORDER — DICLOFENAC POTASSIUM 50 MG PO TABS: ORAL_TABLET | ORAL | 6 refills | Status: DC

## 2022-04-07 NOTE — Telephone Encounter (Signed)
It should be 50-100 mg as needed at onset of headache. I updated the prescription, thanks

## 2022-04-07 NOTE — Telephone Encounter (Signed)
[employer] Victorino Dike) need clarification on max daily dose for diclofenac (CATAFLAM) 50 MG tablet. Would like a call from the nurse.

## 2022-04-18 ENCOUNTER — Ambulatory Visit: Payer: 59 | Admitting: Internal Medicine

## 2022-04-18 ENCOUNTER — Encounter: Payer: Self-pay | Admitting: Internal Medicine

## 2022-04-18 VITALS — BP 104/62 | HR 72 | Ht 69.0 in | Wt 149.0 lb

## 2022-04-18 DIAGNOSIS — L290 Pruritus ani: Secondary | ICD-10-CM

## 2022-04-18 DIAGNOSIS — K641 Second degree hemorrhoids: Secondary | ICD-10-CM

## 2022-04-25 ENCOUNTER — Other Ambulatory Visit: Payer: Self-pay

## 2022-04-25 ENCOUNTER — Encounter: Payer: Self-pay | Admitting: Internal Medicine

## 2022-04-25 MED ORDER — DEXLANSOPRAZOLE 60 MG PO CPDR: 60.0000 mg | DELAYED_RELEASE_CAPSULE | Freq: Every day | ORAL | 2 refills | Status: DC

## 2022-05-06 ENCOUNTER — Encounter: Payer: Self-pay | Admitting: Psychiatry

## 2022-05-09 ENCOUNTER — Other Ambulatory Visit: Payer: Self-pay | Admitting: *Deleted

## 2022-05-09 DIAGNOSIS — G44221 Chronic tension-type headache, intractable: Secondary | ICD-10-CM

## 2022-05-09 MED ORDER — TOPIRAMATE 100 MG PO TABS: 100.0000 mg | ORAL_TABLET | Freq: Every day | ORAL | 5 refills | Status: DC

## 2022-05-10 ENCOUNTER — Encounter: Payer: 59 | Admitting: Internal Medicine

## 2022-06-20 ENCOUNTER — Telehealth: Payer: Self-pay | Admitting: Internal Medicine

## 2022-06-20 NOTE — Telephone Encounter (Signed)
Patient returned your call.

## 2022-06-20 NOTE — Telephone Encounter (Signed)
Patient called this morning stating he thinks he has an anal fissure.  He has an appointment scheduled with [facility] 8/30 at 8:30 a.m.  He wanted to know if there was anything he needed or could do during the interim.  Please call patient and advise.  Thank you.

## 2022-06-20 NOTE — Telephone Encounter (Signed)
Left message for pt to call back  °

## 2022-06-21 NOTE — Progress Notes (Unsigned)
06/21/2022 Bernard [name] [id number] 1986-03-23   Chief Complaint:  History of Present Illness: Bernard [name] is a 36 year old male with a past medical history of hypertension, headaches, cystic acne, GERD and internal hemorrhoids s/p banding 02/2022. He is followed by Dr. Leone Payor. He presents today for further evaluation for a possible anal fissure.      Latest Ref Rng & Units 07/14/2021    2:47 PM 09/17/2019    8:22 AM 08/13/2019    8:26 AM  CBC  WBC 4.0 - 10.5 K/uL 7.5  5.4  5.3   Hemoglobin 13.0 - 17.0 g/dL 42.5  95.6  38.7   Hematocrit 39.0 - 52.0 % 42.5  44.9  47.2   Platelets 150.0 - 400.0 K/uL 267.0  258.0  275.0        Latest Ref Rng & Units 07/14/2021    2:47 PM 08/13/2019    8:26 AM 03/15/2018   12:15 PM  CMP  Glucose 70 - 99 mg/dL 64  97  96   BUN 6 - 23 mg/dL 19  16  19    Creatinine 0.40 - 1.50 mg/dL  5.64  3.32   Sodium 135 - 145 mEq/L 141  139  140   Potassium 3.5 - 5.1 mEq/L 3.6  3.7  4.6   Chloride 96 - 112 mEq/L 104  101  104   CO2 19 - 32 mEq/L 30  30  32   Calcium 8.4 - 10.5 mg/dL 9.2  9.9    9.7  9.8   Total Protein 6.0 - 8.3 g/dL 6.6  7.0  6.8   Total Bilirubin 0.2 - 1.2 mg/dL 0.5  0.6  0.5   Alkaline Phos 39 - 117 U/L 47  49  43   AST 0 - 37 U/L 17  27  31    ALT 0 - 53 U/L 21  29  48     PH with impedence study 10/09/2019:   Esophagea manometry 10/09/2019:  EGD 08/23/2019: - Normal esophagus. Hill Grade 1 GE junction - Multiple gastric polyps. Biopsied. - Gastritis. Biopsied. - The examination was otherwise normal.  EGD 05/18/2015: -Normal appearing esophagus and GE junction, the stomach was well visualized and normal in appearance, normal appearing duodenum   Past Medical History:  Diagnosis Date   Fundic gland polyposis of stomach    GERD (gastroesophageal reflux disease)    Hemorrhoid    Sebaceous cyst    Past Surgical History:  Procedure Laterality Date   ESOPHAGEAL MANOMETRY N/A 10/09/2019   Procedure:  ESOPHAGEAL MANOMETRY (EM);  Surgeon: 05/20/2015, MD;  [facility]: [facility];  Service: Endoscopy;  Laterality: N/A;   ESOPHAGOGASTRODUODENOSCOPY N/A 05/18/2015   Procedure: ESOPHAGOGASTRODUODENOSCOPY (EGD);  Surgeon: Iva Boop, MD;  Location: [facility];  Service: Endoscopy;  Laterality: N/A;   HEMORRHOID BANDING     PH IMPEDANCE STUDY  10/09/2019   Procedure: PH IMPEDANCE STUDY;  Surgeon: [facility], MD;  Location: [facility];  Service: Endoscopy;;   WISDOM TOOTH EXTRACTION         Current Medications, Allergies, Past Medical History, Past Surgical History, Family History and Social History were reviewed in 10/11/2019 record.   Review of Systems:   Constitutional: Negative for fever, sweats, chills or weight loss.  Respiratory: Negative for shortness of breath.   Cardiovascular: Negative for chest pain, palpitations and leg swelling.  Gastrointestinal: See HPI.  Musculoskeletal: Negative for back pain or muscle  aches.  Neurological: Negative for dizziness, headaches or paresthesias.    Physical Exam: There were no vitals taken for this visit. General: Well developed, w   ***male in no acute distress. Head: Normocephalic and atraumatic. Eyes: No scleral icterus. Conjunctiva pink . Ears: Normal auditory acuity. Mouth: Dentition intact. No ulcers or lesions.  Lungs: Clear throughout to auscultation. Heart: Regular rate and rhythm, no murmur. Abdomen: Soft, nontender and nondistended. No masses or hepatomegaly. Normal bowel sounds x 4 quadrants.  Rectal: *** Musculoskeletal: Symmetrical with no gross deformities. Extremities: No edema. Neurological: Alert oriented x 4. No focal deficits.  Psychological: Alert and cooperative. Normal mood and affect  Assessment and Recommendations: ***

## 2022-06-21 NOTE — Telephone Encounter (Signed)
Pt states that starting last Friday night (8/25)  that she had a pain BM and Saturday he had several more painful BM. Pt stated that he feels that he has an anal fissure: Pt stated that he starting increasing his fiber intake, using hydrocortisone cream: Pt states that he is feeling better over all and just has some general discomfort: Pt was notified to use wet wipes along with sitz bath, stool softeners: Pt has appointment already scheduled 06/22/2022 at 8:30 with Alcide Evener NP:   Pt verbalized understanding with all questions answered.

## 2022-06-22 ENCOUNTER — Encounter: Payer: Self-pay | Admitting: Nurse Practitioner

## 2022-06-22 ENCOUNTER — Ambulatory Visit: Payer: 59 | Admitting: Nurse Practitioner

## 2022-06-22 ENCOUNTER — Other Ambulatory Visit (INDEPENDENT_AMBULATORY_CARE_PROVIDER_SITE_OTHER): Payer: 59

## 2022-06-22 VITALS — BP 110/70 | HR 74 | Ht 69.0 in | Wt 149.0 lb

## 2022-06-22 DIAGNOSIS — K625 Hemorrhage of anus and rectum: Secondary | ICD-10-CM

## 2022-06-22 LAB — CBC
HCT: 44.1 % (ref 39.0–52.0)
Hemoglobin: 15.2 g/dL (ref 13.0–17.0)
MCHC: 34.4 g/dL (ref 30.0–36.0)
MCV: 86.8 fl (ref 78.0–100.0)
Platelets: 270 10*3/uL (ref 150.0–400.0)
RBC: 5.08 Mil/uL (ref 4.22–5.81)
RDW: 13 % (ref 11.5–15.5)
WBC: 5.2 10*3/uL (ref 4.0–10.5)

## 2022-06-22 MED ORDER — HYDROCORTISONE ACETATE 25 MG RE SUPP: 25.0000 mg | Freq: Every evening | RECTAL | 0 refills | Status: AC

## 2022-06-22 NOTE — Patient Instructions (Addendum)
  Apply a small amount of Desitin inside the anal opening and to the external anal area tid as needed for anal or hemorrhoidal irritation/bleeding.   Anusol suppository insert one into the rectum at bed time x 5 nights   Continue Metamucil twice daily   Call our office if you wish to schedule a colonoscopy   I appreciate the opportunity to care for you. Alcide Evener, CRNP

## 2022-06-27 ENCOUNTER — Other Ambulatory Visit: Payer: Self-pay | Admitting: Internal Medicine

## 2022-06-27 DIAGNOSIS — G44209 Tension-type headache, unspecified, not intractable: Secondary | ICD-10-CM

## 2022-08-02 ENCOUNTER — Ambulatory Visit (INDEPENDENT_AMBULATORY_CARE_PROVIDER_SITE_OTHER): Payer: 59

## 2022-08-02 ENCOUNTER — Encounter: Payer: Self-pay | Admitting: Internal Medicine

## 2022-08-02 ENCOUNTER — Ambulatory Visit: Payer: 59 | Admitting: Internal Medicine

## 2022-08-02 VITALS — BP 112/66 | HR 78 | Temp 98.1°F | Ht 69.0 in | Wt 153.0 lb

## 2022-08-02 DIAGNOSIS — J301 Allergic rhinitis due to pollen: Secondary | ICD-10-CM | POA: Diagnosis not present

## 2022-08-02 DIAGNOSIS — Z0001 Encounter for general adult medical examination with abnormal findings: Secondary | ICD-10-CM | POA: Diagnosis not present

## 2022-08-02 DIAGNOSIS — J45991 Cough variant asthma: Secondary | ICD-10-CM

## 2022-08-02 DIAGNOSIS — R052 Subacute cough: Secondary | ICD-10-CM | POA: Diagnosis not present

## 2022-08-02 LAB — CBC WITH DIFFERENTIAL/PLATELET
Basophils Absolute: 0.1 10*3/uL (ref 0.0–0.1)
Basophils Relative: 1 % (ref 0.0–3.0)
Eosinophils Absolute: 0.1 10*3/uL (ref 0.0–0.7)
Eosinophils Relative: 1.7 % (ref 0.0–5.0)
HCT: 46 % (ref 39.0–52.0)
Hemoglobin: 15.6 g/dL (ref 13.0–17.0)
Lymphocytes Relative: 32.9 % (ref 12.0–46.0)
Lymphs Abs: 1.8 10*3/uL (ref 0.7–4.0)
MCHC: 33.8 g/dL (ref 30.0–36.0)
MCV: 87.4 fl (ref 78.0–100.0)
Monocytes Absolute: 0.4 10*3/uL (ref 0.1–1.0)
Monocytes Relative: 7.4 % (ref 3.0–12.0)
Neutro Abs: 3.1 10*3/uL (ref 1.4–7.7)
Neutrophils Relative %: 57 % (ref 43.0–77.0)
Platelets: 301 10*3/uL (ref 150.0–400.0)
RBC: 5.27 Mil/uL (ref 4.22–5.81)
RDW: 12.7 % (ref 11.5–15.5)
WBC: 5.4 10*3/uL (ref 4.0–10.5)

## 2022-08-02 LAB — BASIC METABOLIC PANEL
BUN: 19 mg/dL (ref 6–23)
CO2: 26 mEq/L (ref 19–32)
Calcium: 9.7 mg/dL (ref 8.4–10.5)
Chloride: 104 mEq/L (ref 96–112)
Creatinine, Ser: 1.06 mg/dL (ref 0.40–1.50)
GFR: 89.88 mL/min (ref >60.00)
Glucose, Bld: 103 mg/dL — ABNORMAL HIGH (ref 70–99)
Potassium: 4.2 mEq/L (ref 3.5–5.1)
Sodium: 137 mEq/L (ref 135–145)

## 2022-08-02 NOTE — Patient Instructions (Signed)
Health Maintenance, Male Adopting a healthy lifestyle and getting preventive care are important in promoting health and wellness. Ask your health care provider about: The right schedule for you to have regular tests and exams. Things you can do on your own to prevent diseases and keep yourself healthy. What should I know about diet, weight, and exercise? Eat a healthy diet  Eat a diet that includes plenty of vegetables, fruits, low-fat dairy products, and lean protein. Do not eat a lot of foods that are high in solid fats, added sugars, or sodium. Maintain a healthy weight Body mass index (BMI) is a measurement that can be used to identify possible weight problems. It estimates body fat based on height and weight. Your health care provider can help determine your BMI and help you achieve or maintain a healthy weight. Get regular exercise Get regular exercise. This is one of the most important things you can do for your health. Most adults should: Exercise for at least 150 minutes each week. The exercise should increase your heart rate and make you sweat (moderate-intensity exercise). Do strengthening exercises at least twice a week. This is in addition to the moderate-intensity exercise. Spend less time sitting. Even light physical activity can be beneficial. Watch cholesterol and blood lipids Have your blood tested for lipids and cholesterol at 37 years of age, then have this test every 5 years. You may need to have your cholesterol levels checked more often if: Your lipid or cholesterol levels are high. You are older than 37 years of age. You are at high risk for heart disease. What should I know about cancer screening? Many types of cancers can be detected early and may often be prevented. Depending on your health history and family history, you may need to have cancer screening at various ages. This may include screening for: Colorectal cancer. Prostate cancer. Skin cancer. Lung  cancer. What should I know about heart disease, diabetes, and high blood pressure? Blood pressure and heart disease High blood pressure causes heart disease and increases the risk of stroke. This is more likely to develop in people who have high blood pressure readings or are overweight. Talk with your health care provider about your target blood pressure readings. Have your blood pressure checked: Every 3-5 years if you are 18-39 years of age. Every year if you are 40 years old or older. If you are between the ages of 65 and 75 and are a current or former smoker, ask your health care provider if you should have a one-time screening for abdominal aortic aneurysm (AAA). Diabetes Have regular diabetes screenings. This checks your fasting blood sugar level. Have the screening done: Once every three years after age 45 if you are at a normal weight and have a low risk for diabetes. More often and at a younger age if you are overweight or have a high risk for diabetes. What should I know about preventing infection? Hepatitis B If you have a higher risk for hepatitis B, you should be screened for this virus. Talk with your health care provider to find out if you are at risk for hepatitis B infection. Hepatitis C Blood testing is recommended for: Everyone born from 1945 through 1965. Anyone with known risk factors for hepatitis C. Sexually transmitted infections (STIs) You should be screened each year for STIs, including gonorrhea and chlamydia, if: You are sexually active and are younger than 37 years of age. You are older than 37 years of age and your   health care provider tells you that you are at risk for this type of infection. Your sexual activity has changed since you were last screened, and you are at increased risk for chlamydia or gonorrhea. Ask your health care provider if you are at risk. Ask your health care provider about whether you are at high risk for HIV. Your health care provider  may recommend a prescription medicine to help prevent HIV infection. If you choose to take medicine to prevent HIV, you should first get tested for HIV. You should then be tested every 3 months for as long as you are taking the medicine. Follow these instructions at home: Alcohol use Do not drink alcohol if your health care provider tells you not to drink. If you drink alcohol: Limit how much you have to 0-2 drinks a day. Know how much alcohol is in your drink. In the [place] one drink equals one 12 oz bottle of beer (355 mL), one 5 oz glass of wine (148 mL), or one 1 oz glass of hard liquor (44 mL). Lifestyle Do not use any products that contain nicotine or tobacco. These products include cigarettes, chewing tobacco, and vaping devices, such as e-cigarettes. If you need help quitting, ask your health care provider. Do not use street drugs. Do not share needles. Ask your health care provider for help if you need support or information about quitting drugs. General instructions Schedule regular health, dental, and eye exams. Stay current with your vaccines. Tell your health care provider if: You often feel depressed. You have ever been abused or do not feel safe at home. Summary Adopting a healthy lifestyle and getting preventive care are important in promoting health and wellness. Follow your health care provider's instructions about healthy diet, exercising, and getting tested or screened for diseases. Follow your health care provider's instructions on monitoring your cholesterol and blood pressure. This information is not intended to replace advice given to you by your health care provider. Make sure you discuss any questions you have with your health care provider. Document Revised: 03/01/2021 Document Reviewed: 03/01/2021 Elsevier Patient Education  2023 Elsevier Inc.  

## 2022-08-02 NOTE — Progress Notes (Signed)
Subjective:  Patient ID: Bernard [name], male    DOB: [birth year]  Age: 36 y.o. MRN: [id number]  CC: Annual Exam and Cough   HPI Norman Piacentini Marcelino presents for a CPX and f/up -  He complains of a 6-week history of nonproductive cough.  He denies wheezing, chest pain, or shortness of breath.  The cough keeps him awake at night.  He does not want to take a cough suppressant.  He has a history of allergic rhinitis and wants to see an allergist.  He complains of insomnia but is getting good symptom relief with 3 mg of melatonin at bedtime.  Outpatient Medications Prior to Visit  Medication Sig Dispense Refill   cetirizine (ZYRTEC) 10 MG tablet Take 10 mg by mouth every morning.      dexlansoprazole (DEXILANT) 60 MG capsule Take 1 capsule (60 mg total) by mouth daily. 100 capsule 2   diclofenac (CATAFLAM) 50 MG tablet Take as needed for headache. Take 50-100 mg (1-2 pills) at onset of headache. Max dose 2 pills in 24 hours 15 tablet 6   famotidine (PEPCID) 20 MG tablet Take 1 tablet by mouth twice daily. 180 tablet 3   psyllium (METAMUCIL) 58.6 % packet Take 1 packet by mouth daily.     topiramate (TOPAMAX) 100 MG tablet Take 1 tablet (100 mg total) by mouth at bedtime. Maintenance dose 30 tablet 5   TURMERIC PO Take by mouth.     amitriptyline (ELAVIL) 10 MG tablet Take 2 tablets (20 mg total) by mouth at bedtime. 180 tablet 1   hydrocortisone (ANUSOL-HC) 2.5 % rectal cream Place rectally 2 (two) times daily as needed for hemorrhoids. 30 g 0   mirtazapine (REMERON) 7.5 MG tablet Take 1 tablet (7.5 mg total) by mouth at bedtime. 90 tablet 0   No facility-administered medications prior to visit.    ROS Review of Systems  Constitutional: Negative.  Negative for chills, diaphoresis, fatigue and fever.  HENT:  Positive for rhinorrhea. Negative for congestion, sinus pressure and sinus pain.   Respiratory:  Positive for cough. Negative for chest tightness, shortness of breath and wheezing.    Cardiovascular:  Negative for chest pain, palpitations and leg swelling.  Gastrointestinal:  Negative for abdominal pain, diarrhea, nausea and vomiting.  Genitourinary: Negative.  Negative for difficulty urinating, penile swelling and scrotal swelling.  Musculoskeletal: Negative.   Skin: Negative.  Negative for rash.  Neurological:  Negative for dizziness, weakness and numbness.  Hematological:  Negative for adenopathy. Does not bruise/bleed easily.  Psychiatric/Behavioral:  Positive for sleep disturbance. Negative for dysphoric mood. The patient is not nervous/anxious.     Objective:  BP 112/66 (BP Location: Right Arm, Patient Position: Sitting, Cuff Size: Large)   Pulse 78   Temp 98.1 F (36.7 C) (Oral)   Ht 5\' 9"  (1.753 m)   Wt 153 lb (69.4 kg)   SpO2 98%   BMI 22.59 kg/m   BP Readings from Last 3 Encounters:  08/02/22 112/66  06/22/22 110/70  04/18/22 104/62    Wt Readings from Last 3 Encounters:  08/02/22 153 lb (69.4 kg)  06/22/22 149 lb (67.6 kg)  04/18/22 149 lb (67.6 kg)    Physical Exam Vitals reviewed.  Constitutional:      Appearance: He is not ill-appearing.  HENT:     Nose: Nose normal.     Mouth/Throat:     Mouth: Mucous membranes are moist.  Eyes:     General: No scleral icterus.  Conjunctiva/sclera: Conjunctivae normal.  Cardiovascular:     Rate and Rhythm: Normal rate and regular rhythm.     Heart sounds: No murmur heard. Pulmonary:     Effort: Pulmonary effort is normal.     Breath sounds: No stridor. No wheezing, rhonchi or rales.  Abdominal:     General: Abdomen is flat.     Palpations: There is no mass.     Tenderness: There is no abdominal tenderness. There is no guarding.     Hernia: No hernia is present.  Musculoskeletal:        General: Normal range of motion.     Cervical back: Neck supple.     Right lower leg: No edema.     Left lower leg: No edema.  Lymphadenopathy:     Cervical: No cervical adenopathy.  Skin:    General:  Skin is warm and dry.  Neurological:     General: No focal deficit present.     Mental Status: He is alert.  Psychiatric:        Mood and Affect: Mood normal.        Behavior: Behavior normal.     Lab Results  Component Value Date   WBC 5.4 08/02/2022   HGB 15.6 08/02/2022   HCT 46.0 08/02/2022   PLT 301.0 08/02/2022   GLUCOSE 103 (H) 08/02/2022   CHOL 137 09/17/2019   TRIG 115.0 09/17/2019   HDL 47.60 09/17/2019   LDLCALC 66 09/17/2019   ALT 21 07/14/2021   AST 17 07/14/2021   NA 137 08/02/2022   K 4.2 08/02/2022   CL 104 08/02/2022   CREATININE 1.06 08/02/2022   BUN 19 08/02/2022   CO2 26 08/02/2022   TSH 1.00 07/14/2021   HGBA1C 5.6 02/28/2017    DG Chest 2 View  Result Date: 08/02/2022 CLINICAL DATA:  Cough for 6 weeks. EXAM: CHEST - 2 VIEW COMPARISON:  None Available. FINDINGS: The heart size and mediastinal contours are within normal limits. Both lungs are clear. No visible pleural effusions or pneumothorax. No acute osseous abnormality. IMPRESSION: No active cardiopulmonary disease. Electronically Signed   By: Margaretha Sheffield M.D.   On: 08/02/2022 10:33     Assessment & Plan:   Harutyun was seen today for annual exam and cough.  Diagnoses and all orders for this visit:  Subacute cough- He has a paucity of other symptoms.  His exam is normal.  Chest x-ray and labs are normal.  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 -     DG Chest 2 View; Future -     Basic metabolic panel; Future -     CBC with Differential/Platelet; Future -     CBC with Differential/Platelet -     Basic metabolic panel  Encounter for general adult medical examination with abnormal findings- Exam completed, labs reviewed, vaccines reviewed and updated, no cancer screenings indicated, patient education was given.  Seasonal allergic rhinitis due to pollen -     Ambulatory referral to Allergy  Cough variant asthma -     Ambulatory referral to Allergy   I have discontinued Ryann R.  Bochenek's mirtazapine, hydrocortisone, and amitriptyline. I am also having him maintain his cetirizine, TURMERIC PO, famotidine, psyllium, diclofenac, dexlansoprazole, and topiramate.  No orders of the defined types were placed in this encounter.    Follow-up: Return in about 6 months (around 02/01/2023).  Scarlette Calico, MD

## 2022-08-03 DIAGNOSIS — J45991 Cough variant asthma: Secondary | ICD-10-CM | POA: Insufficient documentation

## 2022-08-03 DIAGNOSIS — J301 Allergic rhinitis due to pollen: Secondary | ICD-10-CM | POA: Insufficient documentation

## 2022-08-04 ENCOUNTER — Other Ambulatory Visit: Payer: Self-pay | Admitting: Internal Medicine

## 2022-08-04 DIAGNOSIS — G44221 Chronic tension-type headache, intractable: Secondary | ICD-10-CM

## 2022-09-20 ENCOUNTER — Ambulatory Visit: Payer: 59 | Admitting: Psychiatry

## 2022-09-20 ENCOUNTER — Encounter: Payer: Self-pay | Admitting: Psychiatry

## 2022-09-20 VITALS — BP 112/79 | HR 79 | Ht 69.0 in | Wt 150.0 lb

## 2022-09-20 DIAGNOSIS — G44221 Chronic tension-type headache, intractable: Secondary | ICD-10-CM | POA: Diagnosis not present

## 2022-09-20 NOTE — Progress Notes (Signed)
   CC:  headaches  Follow-up Visit  Last visit: 04/05/22  Brief HPI: 36 year old male with a history of HTN who follows in clinic for chronic headaches.  At his last visit he was started on Topamax for headache prevention and diclofenac for rescue.  Interval History: Headaches were well controlled on Topamax until about one month ago. Tolerates this well without side effects. Headaches have started to return in the past month. He has been taking diclofenac as needed ~2 times per week. This does seem to help reduce his headaches. Often headaches will improve on their own without medication.  He tried Mg, B2, and CoQ10, but was unable to tolerate these due to GI issues.  Headache days per month: 8 Headache free days per month: 22  Current Headache Regimen: Preventative: Topamax 100 mg QHS Abortive: diclofenac 50-100 mg PRN   Prior Therapies                                  Amitriptyline 20 mg QHS - drowsiness, lack of efficacy Cymbalta 60 mg daily - lethargy Topamax 100 mg QHS Remeron Tylenol Ibuprofen Diclofenac 50-100 mg PRN  Physical Exam:   Vital Signs: BP 112/79   Pulse 79   Ht 5\' 9"  (1.753 m)   Wt 150 lb (68 kg)   BMI 22.15 kg/m  GENERAL:  well appearing, in no acute distress, alert  SKIN:  Color, texture, turgor normal. No rashes or lesions HEAD:  Normocephalic/atraumatic. RESP: normal respiratory effort MSK:  No gross joint deformities.   NEUROLOGICAL: Mental Status: Alert, oriented to person, place and time, Follows commands, and Speech fluent and appropriate. Cranial Nerves: PERRL, face symmetric, no dysarthria, hearing grossly intact Motor: moves all extremities equally Gait: normal-based.  IMPRESSION: 36 year old male with a history of HTN who presents for follow up of headaches. He has had improvement with Topamax for prevention. Discussed increasing dose as he has noticed more headaches in the past month. He feels his current headache burden is not  too bothersome and prefers to keep his dose the same for now. Will continue current regimen.  PLAN: -Prevention: Continue Topamax 100 mg QHS -Rescue: Continue diclofenac 50-100 mg PRN  Follow-up: 6 months  I spent a total of 15 minutes on the date of the service. Headache education was done. Discussed treatment options including preventive and acute medications. Discussed medication side effects, adverse reactions and drug interactions. Written educational materials and patient instructions outlining all of the above were given.  30, MD 09/20/22 3:55 PM

## 2022-10-03 ENCOUNTER — Ambulatory Visit: Payer: 59 | Admitting: Psychiatry

## 2022-10-11 ENCOUNTER — Encounter: Payer: Self-pay | Admitting: Allergy & Immunology

## 2022-10-11 ENCOUNTER — Ambulatory Visit: Payer: 59 | Admitting: Allergy & Immunology

## 2022-10-11 ENCOUNTER — Other Ambulatory Visit: Payer: Self-pay

## 2022-10-11 VITALS — BP 112/68 | HR 66 | Temp 98.7°F | Resp 18 | Ht 69.0 in | Wt 148.0 lb

## 2022-10-11 DIAGNOSIS — K219 Gastro-esophageal reflux disease without esophagitis: Secondary | ICD-10-CM

## 2022-10-11 DIAGNOSIS — J4520 Mild intermittent asthma, uncomplicated: Secondary | ICD-10-CM | POA: Diagnosis not present

## 2022-10-11 MED ORDER — AIRSUPRA 90-80 MCG/ACT IN AERO: 2.0000 | INHALATION_SPRAY | RESPIRATORY_TRACT | 1 refills | Status: DC | PRN

## 2022-10-11 NOTE — Patient Instructions (Addendum)
1. Mild intermittent asthma, uncomplicated - Lung testing looked good today. - I am going to start you on AirSupra, a new inhaler that contains albuterol combined with budesonide (a steroid). - I want you to start this two puffs every 4 hours at your next coughing/viral episode to see if it shortens the time. - Sample and copay card provided. - Spacer sample provided.  - We may need to be more aggressive in the future, but time will tell.   2. Gastroesophageal reflux disease - Continue with the Dexilant as well as the Pepcid. - I am not going to make any changes there. - Thanks for the tip on the [place] pharmacies!   3. Return in about 3 months (around 01/10/2023).    Please inform us of any Emergency Department visits, hospitalizations, or changes in symptoms. Call us before going to the ED for breathing or allergy symptoms since we might be able to fit you in for a sick visit. Feel free to contact us anytime with any questions, problems, or concerns.  It was a pleasure to meet you today!  Websites that have reliable patient information: 1. American Academy of Asthma, Allergy, and Immunology: www.aaaai.org 2. Food Allergy Research and Education (FARE): foodallergy.org 3. Mothers of Asthmatics: http://www.asthmacommunitynetwork.org 4. American College of Allergy, Asthma, and Immunology: www.acaai.org   COVID-19 Vaccine Information can be found at: [email or web address] For questions related to vaccine distribution or appointments, please email [email or web address] or call [phone].   We realize that you might be concerned about having an allergic reaction to the COVID19 vaccines. To help with that concern, WE ARE OFFERING THE COVID19 VACCINES IN OUR OFFICE! Ask the front desk for dates!     "Like" [place] on Facebook and Instagram for our latest updates!      A healthy democracy works best when [employer] participate! Make  sure you are registered to vote! If you have moved or changed any of your contact information, you will need to get this updated before voting!  In some cases, you MAY be able to register to vote online: [email or web address]

## 2022-10-11 NOTE — Progress Notes (Signed)
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 Mild intermittent asthma, uncomplicated - Lung testing looked good today. - I am going to start you on AirSupra, a new inhaler that contains albuterol combined with budesonide (a steroid). - I want you to start this two puffs every 4 hours at your next coughing/viral episode to see if it shortens the time. - Sample and copay card provided. - Spacer sample provided.  - We may need to be more aggressive in the future, but time will tell.   2. Gastroesophageal reflux disease - Continue with the Dexilant as well as the Pepcid. - I am not going to make any changes there. - Thanks for the tip on the [place] pharmacies!   3. Return in about 3 months (around 01/10/2023).   This note in its entirety was forwarded to the Provider who requested this consultation.  Subjective:   Bernard [name] is a 36 y.o. male presenting today for evaluation of  Chief Complaint  Patient presents with   Cough   Nasal Congestion    Bernard [name] has a history of the following: Patient Active Problem List   Diagnosis Date Noted   Seasonal allergic rhinitis due to pollen 08/03/2022   Cough variant asthma 08/03/2022   Subacute cough 08/02/2022   Chronic tension-type headache, intractable 02/01/2022   Encounter for general adult medical examination with abnormal findings 07/14/2021   Vitamin D deficiency 07/14/2021   Gastroesophageal reflux disease with esophagitis without hemorrhage 07/14/2021   Tinea corporis 05/20/2020   Functional heartburn    Vitamin D insufficiency 03/16/2018   Patellofemoral syndrome of  both knees 06/09/2017   Benign hypermobility syndrome 06/09/2017   Prolapsed internal hemorrhoids, grade 2 12/23/2015   GASTROESOPHAGEAL REFLUX DISEASE 12/29/2007    History obtained from: chart review and patient.  Bernard [name] was referred by Etta Grandchild, MD.     Bernard [name] is a 36 y.o. male presenting for an evaluation of possible asthma . He is named after his great-grandfather who emigrated from [place]. His grandfather ended up working as a tailor (and not a Scientist, water quality). This was prior to World War 2.   He has been having a lot of issues over the last 2-3 years during the flu and cold season, he tends to catch respiratory infections frequently. But he gets them ore often and they tend to stick around a lot longer. They result in worse coughing that keeps him up at night and interrupts his life. He does not need antibiotics to clear this up. He typically uses Claritin-D and Mucinex to clear it up. He did start vitamin C and zinc during the cold and flu season. March tends to be good and he will be fine during the summer months. It does not seem that the bad coughing occurs. CXR in October 2023 was normal.   He has a two year old who is bringing stuff home and he knows that his job puts him at risk of infections. But he just thinks that he tends to get more infections during the winter months and tends to be more sick with them.  However, he never needs prednisone or antibiotics.  Asthma/Respiratory Symptom History: He never tried using albuterol. He did not have asthma when he was a child at all. He does not remember having bad allergies as a child.   Allergic Rhinitis Symptom History: He grew up in [place] and was on allergy shots for a period of time. Allergies got better over time.  He remembers getting the shots, but he thinks that this was around grade school or something. He was only allergic to molds per the patient. He had mattress covers and air purifiers as a child. He is  not really interested in allergy testing at all.   Food Allergy Symptom History: He has itching with pine nuts. This happened juts once and he does avoid them for the most part. He might have accidentally eaten them and nothing has happened.   GERD Symptom History: He is Dexilant and famotidine. It is controlled with these medications. He sees Dr. Stan Head. He did have to two endoscopies and these have been normal.   Otherwise, there is no history of other atopic diseases, including food allergies, drug allergies, stinging insect allergies, eczema, urticaria, or contact dermatitis. There is no significant infectious history. Vaccinations are up to date.    Past Medical History: Patient Active Problem List   Diagnosis Date Noted   Seasonal allergic rhinitis due to pollen 08/03/2022   Cough variant asthma 08/03/2022   Subacute cough 08/02/2022   Chronic tension-type headache, intractable 02/01/2022   Encounter for general adult medical examination with abnormal findings 07/14/2021   Vitamin D deficiency 07/14/2021   Gastroesophageal reflux disease with esophagitis without hemorrhage 07/14/2021   Tinea corporis 05/20/2020   Functional heartburn    Vitamin D insufficiency 03/16/2018   Patellofemoral syndrome of both knees 06/09/2017   Benign hypermobility syndrome 06/09/2017   Prolapsed internal hemorrhoids, grade 2 12/23/2015   GASTROESOPHAGEAL REFLUX DISEASE 12/29/2007    Medication List:  Allergies as of 10/11/2022       Reactions   Mold Extract [trichophyton]    Sneezing, runny nose.         Medication List        Accurate as of October 11, 2022 12:55 PM. If you have any questions, ask your nurse or doctor.          Airsupra 90-80 MCG/ACT Aero Generic drug: Albuterol-Budesonide Inhale 2 puffs into the lungs every 4 (four) hours as needed. Started by: Alfonse Spruce, MD   cetirizine 10 MG tablet Commonly known as: ZYRTEC Take 10 mg by mouth every  morning.   dexlansoprazole 60 MG capsule Commonly known as: DEXILANT Take 1 capsule (60 mg total) by mouth daily.   diclofenac 50 MG tablet Commonly known as: CATAFLAM Take as needed for headache. Take 50-100 mg (1-2 pills) at onset of headache. Max dose 2 pills in 24 hours   famotidine 20 MG tablet Commonly known as: PEPCID Take 1 tablet by mouth twice daily.   psyllium 58.6 % packet Commonly known as: METAMUCIL Take 1 packet by mouth daily.   topiramate 100 MG tablet Commonly known as: TOPAMAX Take 1 tablet (100 mg total) by mouth at bedtime. Maintenance dose   TURMERIC PO Take by mouth.        Birth History: non-contributory  Developmental History: non-contributory  Past Surgical History: Past Surgical History:  Procedure Laterality Date   ESOPHAGEAL MANOMETRY N/A 10/09/2019   Procedure: ESOPHAGEAL MANOMETRY (EM);  Surgeon: Iva Boop, MD;  Location: [facility];  Service: Endoscopy;  Laterality: N/A;  ESOPHAGOGASTRODUODENOSCOPY N/A 05/18/2015   Procedure: ESOPHAGOGASTRODUODENOSCOPY (EGD);  Surgeon: Inda Castle, MD;  Location: [facility];  Service: Endoscopy;  Laterality: N/A;   HEMORRHOID BANDING     [place] IMPEDANCE STUDY  10/09/2019   Procedure: [place] IMPEDANCE STUDY;  Surgeon: [name], MD;  Location: [facility];  Service: Endoscopy;;   WISDOM TOOTH EXTRACTION       Family History: Family History  Problem Relation Age of Onset   Allergic rhinitis Mother    Heart disease Father        valve replacement   Hypertension Father    GER disease Father    Dementia Father        early onset   Allergic rhinitis Sister    Breast cancer Maternal Grandmother    Colon cancer Neg Hx      Social History: Kion lives at home with his family including his wife and one 2yo child. He has an undergrad that is unrelated to what he is doing now. He does jujitsu and bikes. He has a two year old. He is doing pre-recs for PA school.  He lives in a house  that was built in the 16s.  There is hardwood throughout the home.  There is no mildew damage.  They have gas heating and central cooling.  There is a cat in the home.  There are no dust mite covers on the bedding.  There is no tobacco exposure.  He currently works as a Audiological scientist.  He is not exposed to fumes, chemicals, or dust.  They do not use a HEPA filter.  He was a previous smoker from 2000-2012 and then bathed from 2012-2018.  He has now quit.    Review of Systems  Constitutional: Negative.  Negative for fever, malaise/fatigue and weight loss.  HENT: Negative.  Negative for congestion, ear discharge and ear pain.   Eyes:  Negative for pain, discharge and redness.  Respiratory:  Positive for cough. Negative for sputum production, shortness of breath and wheezing.   Cardiovascular: Negative.  Negative for chest pain and palpitations.  Gastrointestinal:  Negative for abdominal pain, heartburn, nausea and vomiting.  Skin: Negative.  Negative for itching and rash.  Neurological:  Negative for dizziness and headaches.  Endo/Heme/Allergies:  Negative for environmental allergies. Does not bruise/bleed easily.       Objective:   Blood pressure 112/68, pulse 66, temperature 98.7 F (37.1 C), temperature source Temporal, resp. rate 18, height 5\' 9"  (1.753 m), weight 148 lb (67.1 kg), SpO2 98 %. Body mass index is 21.86 kg/m.     Physical Exam Constitutional:      Appearance: He is well-developed.  HENT:     Head: Normocephalic and atraumatic.     Right Ear: Tympanic membrane, ear canal and external ear normal. No drainage, swelling or tenderness. Tympanic membrane is not injected, scarred, erythematous, retracted or bulging.     Left Ear: Tympanic membrane, ear canal and external ear normal. No drainage, swelling or tenderness. Tympanic membrane is not injected, scarred, erythematous, retracted or bulging.     Nose: No nasal deformity, septal deviation, mucosal edema or rhinorrhea.      Right Sinus: No maxillary sinus tenderness or frontal sinus tenderness.     Left Sinus: No maxillary sinus tenderness or frontal sinus tenderness.     Mouth/Throat:     Mouth: Mucous membranes are not pale and not dry.     Pharynx: Uvula midline.  Eyes:     General:  Right eye: No discharge.        Left eye: No discharge.     Conjunctiva/sclera: Conjunctivae normal.     Right eye: Right conjunctiva is not injected. No chemosis.    Left eye: Left conjunctiva is not injected. No chemosis.    Pupils: Pupils are equal, round, and reactive to light.  Cardiovascular:     Rate and Rhythm: Normal rate and regular rhythm.     Heart sounds: Normal heart sounds.  Pulmonary:     Effort: Pulmonary effort is normal. No tachypnea, accessory muscle usage or respiratory distress.     Breath sounds: Normal breath sounds. No wheezing, rhonchi or rales.  Chest:     Chest wall: No tenderness.  Abdominal:     Tenderness: There is no abdominal tenderness. There is no guarding or rebound.  Lymphadenopathy:     Head:     Right side of head: No submandibular, tonsillar or occipital adenopathy.     Left side of head: No submandibular, tonsillar or occipital adenopathy.     Cervical: No cervical adenopathy.  Skin:    Coloration: Skin is not pale.     Findings: No abrasion, erythema, petechiae or rash. Rash is not papular, urticarial or vesicular.  Neurological:     Mental Status: He is alert.      Diagnostic studies:    Spirometry: results normal (FEV1: 4.81/116%, FVC: 5.48/106%, FEV1/FVC: 88%).    Spirometry consistent with normal pattern.   Allergy Studies: none           Salvatore Marvel, MD [facility]

## 2022-10-22 ENCOUNTER — Encounter: Payer: Self-pay | Admitting: Internal Medicine

## 2022-10-25 ENCOUNTER — Other Ambulatory Visit: Payer: Self-pay

## 2022-10-25 MED ORDER — DEXLANSOPRAZOLE 60 MG PO CPDR: 60.0000 mg | DELAYED_RELEASE_CAPSULE | Freq: Every day | ORAL | 0 refills | Status: DC

## 2022-10-25 NOTE — Telephone Encounter (Signed)
Bernard [name] requested his Dexilant be sent local due to shipping delays from [employer]. I will inform him this has been done.

## 2022-10-27 ENCOUNTER — Other Ambulatory Visit (HOSPITAL_COMMUNITY): Payer: Self-pay

## 2022-10-27 ENCOUNTER — Telehealth: Payer: Self-pay | Admitting: Pharmacy Technician

## 2022-10-27 NOTE — Telephone Encounter (Signed)
Patient Advocate Encounter  Received notification from [employer] that prior authorization for DEXLANSOPRAZOLE 60MG  is required.   PA submitted on 1.4.24 Key [id number] Status is pending

## 2022-11-05 ENCOUNTER — Encounter: Payer: Self-pay | Admitting: Psychiatry

## 2022-11-05 ENCOUNTER — Other Ambulatory Visit: Payer: Self-pay | Admitting: Psychiatry

## 2022-11-05 DIAGNOSIS — G44221 Chronic tension-type headache, intractable: Secondary | ICD-10-CM

## 2022-11-07 ENCOUNTER — Other Ambulatory Visit: Payer: Self-pay

## 2022-11-07 DIAGNOSIS — G44221 Chronic tension-type headache, intractable: Secondary | ICD-10-CM

## 2022-11-07 MED ORDER — TOPIRAMATE 100 MG PO TABS: 100.0000 mg | ORAL_TABLET | Freq: Every day | ORAL | 2 refills | Status: DC

## 2022-11-08 ENCOUNTER — Ambulatory Visit: Payer: Self-pay

## 2022-11-08 ENCOUNTER — Ambulatory Visit: Payer: 59 | Admitting: Family Medicine

## 2022-11-08 VITALS — BP 120/82 | HR 74 | Ht 69.0 in | Wt 154.0 lb

## 2022-11-08 DIAGNOSIS — M79601 Pain in right arm: Secondary | ICD-10-CM | POA: Diagnosis not present

## 2022-11-08 NOTE — Progress Notes (Signed)
   I, Peterson Lombard, LAT, ATC acting as a scribe for Lynne Leader, MD.  Bernard [name] is a 37 y.o. male who presents to [place] [facility] today for R shoulder pain. Pt was last seen by Dr. Georgina Snell on 05/25/21 for unclear R arm pain. Today, pt c/o R shoulder pain ongoing since 10/29/22. Pt injured his R shoulder doing jujitsu. Pt locates pain to the superior aspect of the R shoulder. Pt reports R shoulder pain is mild, but just not getting all the way better.   Radiates: no UE Numbness/tingling: no UE Weakness: no Aggravates: flexion Treatments tried: limiting motions  Pertinent review of systems: No fevers or chills  Relevant historical information: Migraine headaches.  Patient works as a Audiological scientist.   Exam:  BP 120/82   Pulse 74   Ht 5\' 9"  (1.753 m)   Wt 154 lb (69.9 kg)   SpO2 100%   BMI 22.74 kg/m  General: Well Developed, well nourished, and in no acute distress.   MSK: Right shoulder: Normal-appearing Nontender. Range of motion: Forward flexion normal.  Abduction active limited to 100 degrees.  Passive full. External and internal rotation range of motion are normal. Strength intact. Positive Hawkins and Neer's test.  Positive empty can test. Negative Yergason speeds test.    Lab and Radiology Results   Diagnostic Limited MSK Ultrasound of: Right shoulder Biceps tendon intact normal-appearing Subscapularis tendon normal-appearing Supraspinatus tendon without tear.  Mild subacromial bursitis is present. Infraspinatus tendon is normal. AC joint normal appearing Impression: Subacromial impingement and bursitis     Assessment and Plan: 37 y.o. male with right shoulder pain thought to be due to subacromial impingement/bursitis.  He did have an injury possibly while doing jujitsu.  Plan for home exercise program.  If not improving add formal physical therapy.  Recheck back as needed especially if not improving.   PDMP not reviewed this  encounter. Orders Placed This Encounter  Procedures   [place] LIMITED JOINT SPACE STRUCTURES UP RIGHT(NO LINKED CHARGES)    Order Specific Question:   Reason for Exam (SYMPTOM  OR DIAGNOSIS REQUIRED)    Answer:   right shoulder pain    Order Specific Question:   Preferred imaging location?    Answer:   [place]   No orders of the defined types were placed in this encounter.    Discussed warning signs or symptoms. Please see discharge instructions. Patient expresses understanding.   The above documentation has been reviewed and is accurate and complete Lynne Leader, M.D.

## 2022-11-08 NOTE — Patient Instructions (Addendum)
Thank you for coming in today.   Please complete the exercises that the athletic trainer went over with you:  View at [email or web address] using code: [id number]  If not better soon let me know and I will order PT.

## 2022-11-25 ENCOUNTER — Other Ambulatory Visit: Payer: Self-pay | Admitting: Internal Medicine

## 2022-12-01 ENCOUNTER — Encounter: Payer: Self-pay | Admitting: Family Medicine

## 2022-12-01 ENCOUNTER — Other Ambulatory Visit: Payer: Self-pay

## 2022-12-01 DIAGNOSIS — M79601 Pain in right arm: Secondary | ICD-10-CM

## 2023-01-05 ENCOUNTER — Ambulatory Visit: Payer: 59 | Admitting: Allergy & Immunology

## 2023-02-01 ENCOUNTER — Encounter: Payer: Self-pay | Admitting: Internal Medicine

## 2023-02-01 ENCOUNTER — Ambulatory Visit: Payer: 59 | Admitting: Internal Medicine

## 2023-02-01 VITALS — BP 118/78 | HR 75 | Temp 98.3°F | Ht 69.0 in | Wt 150.0 lb

## 2023-02-01 DIAGNOSIS — F321 Major depressive disorder, single episode, moderate: Secondary | ICD-10-CM | POA: Diagnosis not present

## 2023-02-01 MED ORDER — VORTIOXETINE HBR 5 MG PO TABS: 5.0000 mg | ORAL_TABLET | Freq: Every day | ORAL | 0 refills | Status: DC

## 2023-02-01 MED ORDER — VORTIOXETINE HBR 10 MG PO TABS: 10.0000 mg | ORAL_TABLET | Freq: Every day | ORAL | 0 refills | Status: DC

## 2023-02-01 NOTE — Patient Instructions (Signed)

## 2023-02-01 NOTE — Progress Notes (Signed)
Subjective:  Patient ID: Bernard [name], male    DOB: 1986-06-10  Age: 37 y.o. MRN: [id number]  CC: Depression   HPI Bernard [name] presents for f/up ---  He complains of a several month history of feeling anxious, easily distracted, fatigued, overwhelmed, and difficulty falling asleep.  He does not feel worthless, helpless, hopeless, suicidal, or homicidal.  His appetite is good and there have been no weight changes.  Outpatient Medications Prior to Visit  Medication Sig Dispense Refill   AIRSUPRA 90-80 MCG/ACT AERO Inhale 2 puffs into the lungs every 4 (four) hours as needed. 10.7 g 1   cetirizine (ZYRTEC) 10 MG tablet Take 10 mg by mouth every morning.      dexlansoprazole (DEXILANT) 60 MG capsule Take 1 capsule (60 mg total) by mouth daily. 30 capsule 0   diclofenac (CATAFLAM) 50 MG tablet Take as needed for headache. Take 50-100 mg (1-2 pills) at onset of headache. Max dose 2 pills in 24 hours 15 tablet 6   famotidine (PEPCID) 20 MG tablet Take 1 tablet by mouth twice daily. 180 tablet 2   psyllium (METAMUCIL) 58.6 % packet Take 1 packet by mouth daily.     topiramate (TOPAMAX) 100 MG tablet Take 1 tablet (100 mg total) by mouth at bedtime. 90 tablet 2   TURMERIC PO Take by mouth.     No facility-administered medications prior to visit.    ROS Review of Systems  Constitutional:  Positive for fatigue. Negative for unexpected weight change.  HENT: Negative.    Eyes: Negative.   Gastrointestinal:  Negative for abdominal pain, diarrhea and nausea.  Endocrine: Negative.   Genitourinary: Negative.   Musculoskeletal: Negative.   Neurological: Negative.   Hematological:  Negative for adenopathy. Does not bruise/bleed easily.  Psychiatric/Behavioral:  Positive for decreased concentration, dysphoric mood and sleep disturbance. Negative for behavioral problems, confusion and suicidal ideas. The patient is nervous/anxious.     Objective:  BP 118/78 (BP Location: Right Arm,  Patient Position: Sitting, Cuff Size: Large)   Pulse 75   Temp 98.3 F (36.8 C) (Oral)   Ht 5\' 9"  (1.753 m)   Wt 150 lb (68 kg)   SpO2 98%   BMI 22.15 kg/m   BP Readings from Last 3 Encounters:  02/01/23 118/78  11/08/22 120/82  10/11/22 112/68    Wt Readings from Last 3 Encounters:  02/01/23 150 lb (68 kg)  11/08/22 154 lb (69.9 kg)  10/11/22 148 lb (67.1 kg)    Physical Exam Vitals reviewed.  Constitutional:      Appearance: Normal appearance.  Neurological:     General: No focal deficit present.     Mental Status: He is alert.  Psychiatric:        Attention and Perception: Attention and perception normal.        Mood and Affect: Mood is depressed. Mood is not anxious. Affect is flat. Affect is not blunt.        Speech: Speech normal. He is communicative. Speech is not rapid and pressured, delayed or tangential.        Behavior: Behavior normal.        Thought Content: Thought content normal.        Cognition and Memory: Cognition normal.        Judgment: Judgment normal.     Lab Results  Component Value Date   WBC 5.4 08/02/2022   HGB 15.6 08/02/2022   HCT 46.0 08/02/2022   PLT 301.0  08/02/2022   GLUCOSE 103 (H) 08/02/2022   CHOL 137 09/17/2019   TRIG 115.0 09/17/2019   HDL 47.60 09/17/2019   LDLCALC 66 09/17/2019   ALT 21 07/14/2021   AST 17 07/14/2021   NA 137 08/02/2022   K 4.2 08/02/2022   CL 104 08/02/2022   CREATININE 1.06 08/02/2022   BUN 19 08/02/2022   CO2 26 08/02/2022   TSH 1.00 07/14/2021   HGBA1C 5.6 02/28/2017    No results found.  Assessment & Plan:    Current moderate episode of major depressive disorder without prior episode- Will treat with vortioxetine. -     Vortioxetine HBr; Take 1 tablet (5 mg total) by mouth daily.  Dispense: 7 tablet; Refill: 0 -     Vortioxetine HBr; Take 1 tablet (10 mg total) by mouth daily.  Dispense: 21 tablet; Refill: 0     Follow-up: Return in about 3 months (around 05/03/2023).  Sanda Linger, MD

## 2023-02-14 NOTE — Telephone Encounter (Signed)
PA has been DENIED due to:   The requested medication and/or diagnosis are not a covered benefit and are excluded from coverage in accordance with the terms and conditions of your plan benefit. Therefore, this request has been administratively denied. 

## 2023-02-16 ENCOUNTER — Encounter: Payer: Self-pay | Admitting: Internal Medicine

## 2023-02-18 ENCOUNTER — Other Ambulatory Visit: Payer: Self-pay | Admitting: Internal Medicine

## 2023-02-18 DIAGNOSIS — F321 Major depressive disorder, single episode, moderate: Secondary | ICD-10-CM

## 2023-02-18 MED ORDER — VORTIOXETINE HBR 20 MG PO TABS: 20.0000 mg | ORAL_TABLET | Freq: Every day | ORAL | 0 refills | Status: DC

## 2023-02-23 ENCOUNTER — Encounter: Payer: Self-pay | Admitting: Psychiatry

## 2023-02-27 ENCOUNTER — Other Ambulatory Visit: Payer: Self-pay

## 2023-02-27 DIAGNOSIS — G44221 Chronic tension-type headache, intractable: Secondary | ICD-10-CM

## 2023-02-27 MED ORDER — TOPIRAMATE 100 MG PO TABS: 100.0000 mg | ORAL_TABLET | Freq: Every day | ORAL | 1 refills | Status: AC

## 2023-02-28 ENCOUNTER — Telehealth: Payer: Self-pay

## 2023-02-28 NOTE — Telephone Encounter (Signed)
Spoke with [employer] about pt Topamax and the directions. Told Pharmacist that we already spoke to the pt and told him to take 2 pills at night and that this is a trial to see if it works and if not we will discuss further treatment options.

## 2023-03-14 ENCOUNTER — Encounter: Payer: Self-pay | Admitting: Psychiatry

## 2023-03-21 ENCOUNTER — Telehealth: Payer: Self-pay | Admitting: Internal Medicine

## 2023-03-21 NOTE — Telephone Encounter (Signed)
Patient called seeking advise for hemorrhoid flare up he's had since Sunday morning.

## 2023-03-22 NOTE — Telephone Encounter (Signed)
Pt stated that he has been having some extreme hemorrhoid  pain, irritation, pressure, discomfort when walking, waking up in the middle of the night. Pt has been taking sitz bath, using witch hazel, wet wipes,  prescription hydrocortisone cream. Pt was scheduled for the first available office visit. 03/29/2023 at 10:30 AM with Quentin Mulling PA. Pt made aware. Pt verbalized understanding with all questions answered.

## 2023-03-22 NOTE — Telephone Encounter (Signed)
Calling back regarding painful flare up of hemorroids. Looking for advice please.

## 2023-03-28 NOTE — Progress Notes (Unsigned)
03/29/2023 Bernard [name] [id number] 08-13-1986  Referring provider: Etta Grandchild, MD Primary GI doctor: Dr. 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 Will schedule for hemorrhoidal banding with Dr. Leone Payor, may need to discuss at some point flex sigmoidoscopy or colonoscopy, and may need colorectal surgery referral. Refilled steroid cream and suppositories, sitz bath's and fiber continue.  Gastroesophageal reflux disease, unspecified whether esophagitis present Refilled Dexilant, continue Pepcid at night Well-controlled  Patient Care Team: Etta Grandchild, MD as PCP - General (Internal Medicine)  HISTORY OF PRESENT ILLNESS: 37 y.o. male with a past medical history of  of chronic headaches, cystic acne, GERD and internal hemorrhoids s/p banding 02/2022 and others listed below presents for evaluation of hemmoroids.   Anoscopy exam by Dr .Leone Payor 03/01/2022 showed slightly hypopigmentation of skin prolapse and palpable grade 2 internal hemorrhoids s/p banding to the RA internal hemorrhoids.  Band came off within 2-3 days but symptoms improved.  Patient has not had colonoscopy. 08/02/2022 no anemia   He has BM daily formed, he is on fiber supplement, Has complete BM. Occ will have 1-2 BM, no diarrhea, no constipation.  Sunday before last, he felt a pressure with BM, not a hard stool.  He states over the next couple  of days had increased pressure.  Started on sitz bathes, witch hazel, RX hydrocortisone.  He states he had such pressure, he pushed back in what he thought was prolapsed hemorrhoid but with walking he had prolapse again.  He has had pressure, discomfort with walking/standing, only time that is better is lying on side or taking hot bath.  He normally exercises and rides his bike but he can not, big disruption in his life. He denies rectal bleeding, weight loss, AB pain.  He is about to change insurances, and will need to be referred with [facility]. Work is making it worse.  08/23/2019 EGD  fundic gland polyps and otherwise normal 10/09/2019 pH and esophageal manometry studies Patient on Dexilant and Pepcid twice daily for reflux symptoms.  He denies blood thinner use.  He denies NSAID use.  He reports ETOH use, rare He denies tobacco use.  He denies drug use.    He  reports that he quit smoking about 10 years ago. His smoking use included e-cigarettes and cigarettes. He has a 10.00 pack-year smoking history. He has never been exposed to tobacco smoke. He has never used smokeless tobacco. He reports current alcohol use. He reports that he does not use drugs.  RELEVANT LABS AND IMAGING: CBC    Component Value Date/Time   WBC 5.4 08/02/2022 1027   RBC 5.27 08/02/2022 1027   HGB 15.6 08/02/2022 1027   HCT 46.0 08/02/2022 1027   PLT 301.0 08/02/2022 1027   MCV 87.4 08/02/2022 1027   MCH 29.1 05/01/2015 0525   MCHC 33.8 08/02/2022 1027   RDW 12.7 08/02/2022 1027   LYMPHSABS 1.8 08/02/2022 1027   MONOABS 0.4 08/02/2022 1027   EOSABS 0.1 08/02/2022 1027  BASOSABS 0.1 08/02/2022 1027   Recent Labs    06/22/22 0911 08/02/22 1027  HGB 15.2 15.6    CMP     Component Value Date/Time   NA 137 08/02/2022 1027   K 4.2 08/02/2022 1027   CL 104 08/02/2022 1027   CO2 26 08/02/2022 1027   GLUCOSE 103 (H) 08/02/2022 1027   BUN 19 08/02/2022 1027   CREATININE 1.06 08/02/2022 1027    CALCIUM 9.7 08/02/2022 1027   PROT 6.6 07/14/2021 1447   ALBUMIN 4.3 07/14/2021 1447   AST 17 07/14/2021 1447   ALT 21 07/14/2021 1447   ALKPHOS 47 07/14/2021 1447   BILITOT 0.5 07/14/2021 1447   GFRNONAA >60 05/01/2015 0525   GFRAA >60 05/01/2015 0525      Latest Ref Rng & Units 07/14/2021    2:47 PM 08/13/2019    8:26 AM 03/15/2018   12:15 PM  Hepatic Function  Total Protein 6.0 - 8.3 g/dL 6.6  7.0  6.8   Albumin 3.5 - 5.2 g/dL 4.3  4.7  4.4   AST 0 - 37 U/L 17  27  31    ALT 0 - 53 U/L 21  29  48   Alk Phosphatase 39 - 117 U/L 47  49  43   Total Bilirubin 0.2 - 1.2 mg/dL 0.5  0.6  0.5   Bilirubin, Direct 0.0 - 0.3 mg/dL 0.1         Current Medications:     Current Outpatient Medications (Respiratory):    AIRSUPRA 90-80 MCG/ACT AERO, Inhale 2 puffs into the lungs every 4 (four) hours as needed.   cetirizine (ZYRTEC) 10 MG tablet, Take 10 mg by mouth every morning.   Current Outpatient Medications (Analgesics):    diclofenac (CATAFLAM) 50 MG tablet, Take as needed for headache. Take 50-100 mg (1-2 pills) at onset of headache. Max dose 2 pills in 24 hours   Current Outpatient Medications (Other):    famotidine (PEPCID) 20 MG tablet, Take 1 tablet by mouth twice daily.   psyllium (METAMUCIL) 58.6 % packet, Take 1 packet by mouth daily.   topiramate (TOPAMAX) 100 MG tablet, Take 1 tablet (100 mg total) by mouth at bedtime.   TURMERIC PO, Take by mouth.   vortioxetine HBr (TRINTELLIX) 20 MG TABS tablet, Take 1 tablet (20 mg total) by mouth daily.   dexlansoprazole (DEXILANT) 60 MG capsule, Take 1 capsule (60 mg total) by mouth daily.   hydrocortisone (ANUSOL-HC) 2.5 % rectal cream, Place 1 Application rectally 2 (two) times daily.   hydrocortisone (ANUSOL-HC) 25 MG suppository, Place 1 suppository (25 mg total) rectally 2 (two) times daily.  Medical History:  Past Medical History:  Diagnosis Date   Fundic gland polyposis of stomach    GERD (gastroesophageal reflux  disease)    Hemorrhoid    Sebaceous cyst    Allergies:  Allergies  Allergen Reactions   Mold Extract [Trichophyton]     Sneezing, runny nose.      Surgical History:  He  has a past surgical history that includes Esophagogastroduodenoscopy (N/A, 05/18/2015); Wisdom tooth extraction; Esophageal manometry (N/A, 10/09/2019); PH impedance study (10/09/2019); and Hemorrhoid banding. Family History:  His family history includes Allergic rhinitis in his mother and sister; Breast cancer in his maternal grandmother; Dementia in his father; GER disease in his father; Heart disease in his father; Hypertension in his father.  REVIEW OF SYSTEMS  : All other systems reviewed and negative except where noted in the History of Present  Illness.  PHYSICAL EXAM: Ht 5\' 9"  (1.753 m)   Wt 147 lb (66.7 kg)   BMI 21.71 kg/m  General Appearance: Well nourished, in no apparent distress. Head:   Normocephalic and atraumatic. Eyes:  sclerae anicteric,conjunctive pink  Respiratory: Respiratory effort normal, BS equal bilaterally without rales, rhonchi, wheezing. Cardio: RRR with no MRGs. Peripheral pulses intact.  Abdomen: Soft,  Non-distended ,active bowel sounds. No tenderness . No masses. Rectal: Left external rectum with slightly prolapsed internal hemorrhoid/thrombosed, able to reduce prolapse, large internal hemorrhoids, nontender but some pressure,, normal rectal tone, appreciated internal hemorrhoids, non-tender, no masses, no stool appreciated hemoccult Negative Musculoskeletal: Full ROM, Normal gait. Without edema. Skin:  Dry and intact without significant lesions or rashes Neuro: Alert and  oriented x4;  No focal deficits. Psych:  Cooperative. Normal mood and affect.    Doree Albee, PA-C 11:28 AM

## 2023-03-29 ENCOUNTER — Encounter: Payer: Self-pay | Admitting: Physician Assistant

## 2023-03-29 ENCOUNTER — Ambulatory Visit: Payer: 59 | Admitting: Physician Assistant

## 2023-03-29 VITALS — Ht 69.0 in | Wt 147.0 lb

## 2023-03-29 DIAGNOSIS — K641 Second degree hemorrhoids: Secondary | ICD-10-CM | POA: Diagnosis not present

## 2023-03-29 DIAGNOSIS — K219 Gastro-esophageal reflux disease without esophagitis: Secondary | ICD-10-CM

## 2023-03-29 MED ORDER — DEXLANSOPRAZOLE 60 MG PO CPDR: 60.0000 mg | DELAYED_RELEASE_CAPSULE | Freq: Every day | ORAL | 3 refills | Status: AC

## 2023-03-29 MED ORDER — HYDROCORTISONE (PERIANAL) 2.5 % EX CREA: 1.0000 | TOPICAL_CREAM | Freq: Two times a day (BID) | CUTANEOUS | 2 refills | Status: DC

## 2023-03-29 MED ORDER — HYDROCORTISONE ACETATE 25 MG RE SUPP
25.0000 mg | Freq: Two times a day (BID) | RECTAL | 0 refills | Status: DC
Start: 2023-03-29 — End: 2024-03-20

## 2023-03-29 MED ORDER — HYDROCORTISONE ACETATE 25 MG RE SUPP: 25.0000 mg | Freq: Two times a day (BID) | RECTAL | 0 refills | Status: DC

## 2023-03-29 NOTE — Patient Instructions (Addendum)
If the hemorrhoid suppository sent in is too expensive you can do this over the counter trick.  Apply a pea size amount of over Anusol HC cream to the tip of an over the counter PrepH suppository and insert rectally once every night for at least 7 nights.   We have scheduled you a hem banding with Dr Leone Payor on 05/04/2023 at 2:50pm  Toileting tips to help with your constipation - Drink at least 64-80 ounces of water/liquid per day. - Establish a time to try to move your bowels every day.  For many people, this is after a cup of coffee or after a meal such as breakfast. - Sit all of the way back on the toilet keeping your back fairly straight and while sitting up, try to rest the tops of your forearms on your upper thighs.   - Raising your feet with a step stool/squatty potty can be helpful to improve the angle that allows your stool to pass through the rectum. - Relax the rectum feeling it bulge toward the toilet water.  If you feel your rectum raising toward your body, you are contracting rather than relaxing. - Breathe in and slowly exhale. "Belly breath" by expanding your belly towards your belly button. Keep belly expanded as you gently direct pressure down and back to the anus.  A low pitched GRRR sound can assist with increasing intra-abdominal pressure.  - Repeat 3-4 times. If unsuccessful, contract the pelvic floor to restore normal tone and get off the toilet.  Avoid excessive straining. - To reduce excessive wiping by teaching your anus to normally contract, place hands on outer aspect of knees and resist knee movement outward.  Hold 5-10 second then place hands just inside of knees and resist inward movement of knees.  Hold 5 seconds.  Repeat a few times each way.  _______________________________________________________  If your blood pressure at your visit was 140/90 or greater, please contact your primary care physician to follow up on  this.  _______________________________________________________  If you are age 34 or older, your body mass index should be between 23-30. Your Body mass index is 21.71 kg/m. If this is out of the aforementioned range listed, please consider follow up with your Primary Care Provider.  If you are age 71 or younger, your body mass index should be between 19-25. Your Body mass index is 21.71 kg/m. If this is out of the aformentioned range listed, please consider follow up with your Primary Care Provider.   ________________________________________________________  The [facility] providers would like to encourage you to use [facility] to communicate with providers for non-urgent requests or questions.  Due to long hold times on the telephone, sending your provider a message by [facility] may be a faster and more efficient way to get a response.  Please allow 48 business hours for a response.  Please remember that this is for non-urgent requests.  _______________________________________________________ It was a pleasure to see you today!  Thank you for trusting me with your gastrointestinal care!

## 2023-04-04 NOTE — Progress Notes (Unsigned)
[name] [address] [place] [address] Phone: [phone] Subjective:   Bernard [name], am serving as a scribe for Dr. Antoine Primas.  I'm seeing this patient by the request  of:  Etta Grandchild, MD  CC: Right shoulder pain  FAO:[id number]  Bernard [name] is a 37 y.o. male coming in with complaint of R shoulder pain from earlier this year while doing ji jit su. Last seen October 2020 for B knee pain. Patient states that his pain started in anterior shoulder. Pain radiates into the R trap. Has been doing PT but pain is not really improving. Wearing brace today that is helpful to decrease his pain. Has been modifying activity to manage pain. Unable to be as active with his 38 year old.      Past Medical History:  Diagnosis Date   Fundic gland polyposis of stomach    GERD (gastroesophageal reflux disease)    Hemorrhoid    Sebaceous cyst    Past Surgical History:  Procedure Laterality Date   ESOPHAGEAL MANOMETRY N/A 10/09/2019   Procedure: ESOPHAGEAL MANOMETRY (EM);  Surgeon: Iva Boop, MD;  Location: [facility];  Service: Endoscopy;  Laterality: N/A;   ESOPHAGOGASTRODUODENOSCOPY N/A 05/18/2015   Procedure: ESOPHAGOGASTRODUODENOSCOPY (EGD);  Surgeon: Louis Meckel, MD;  Location: [facility];  Service: Endoscopy;  Laterality: N/A;   HEMORRHOID BANDING     PH IMPEDANCE STUDY  10/09/2019   Procedure: PH IMPEDANCE STUDY;  Surgeon: Iva Boop, MD;  Location: [facility];  Service: Endoscopy;;   WISDOM TOOTH EXTRACTION     Social History   Socioeconomic History   Marital status: Married    Spouse name: Not on file   Number of children: 2   Years of education: Not on file   Highest education level: Not on file  Occupational History   Occupation: Paramedic    Employer: [employer]  Tobacco Use   Smoking status: Former    Packs/day: 1.00    Years: 10.00    Additional pack years: 0.00    Total pack years: 10.00     Types: E-cigarettes, Cigarettes    Quit date: 11/24/2012    Years since quitting: 10.3    Passive exposure: Never   Smokeless tobacco: Never   Tobacco comments:    quit e cigarette 2016  Vaping Use   Vaping Use: Former  Substance and Sexual Activity   Alcohol use: Yes    Alcohol/week: 0.0 standard drinks of alcohol    Comment: occasional   Drug use: No   Sexual activity: Yes    Partners: Female  Other Topics Concern   Not on file  Social History Narrative   Paramedic [employer]   Married and lives with his wife and 1 child   Dr. Tora Perches stepson   Very occasional alcohol he is a former smoker   No drug use   Social Determinants of Corporate investment banker Strain: Not on file  Food Insecurity: Not on file  Transportation Needs: Not on file  Physical Activity: Not on file  Stress: Not on file  Social Connections: Not on file   Allergies  Allergen Reactions   Mold Extract [Trichophyton]     Sneezing, runny nose.    Family History  Problem Relation Age of Onset   Allergic rhinitis Mother    Heart disease Father        valve replacement   Hypertension Father  GER disease Father    Dementia Father        early onset   Allergic rhinitis Sister    Breast cancer Maternal Grandmother    Colon cancer Neg Hx       Current Outpatient Medications (Respiratory):    AIRSUPRA 90-80 MCG/ACT AERO, Inhale 2 puffs into the lungs every 4 (four) hours as needed.   cetirizine (ZYRTEC) 10 MG tablet, Take 10 mg by mouth every morning.   Current Outpatient Medications (Analgesics):    diclofenac (CATAFLAM) 50 MG tablet, Take as needed for headache. Take 50-100 mg (1-2 pills) at onset of headache. Max dose 2 pills in 24 hours   Current Outpatient Medications (Other):    gabapentin (NEURONTIN) 100 MG capsule, Take 2 capsules (200 mg total) by mouth at bedtime.   dexlansoprazole (DEXILANT) 60 MG capsule, Take 1 capsule (60 mg total) by mouth daily.   famotidine  (PEPCID) 20 MG tablet, Take 1 tablet by mouth twice daily.   hydrocortisone (ANUSOL-HC) 2.5 % rectal cream, Place 1 Application rectally 2 (two) times daily.   hydrocortisone (ANUSOL-HC) 25 MG suppository, Place 1 suppository (25 mg total) rectally 2 (two) times daily.   psyllium (METAMUCIL) 58.6 % packet, Take 1 packet by mouth daily.   topiramate (TOPAMAX) 100 MG tablet, Take 1 tablet (100 mg total) by mouth at bedtime.   TURMERIC PO, Take by mouth.   vortioxetine HBr (TRINTELLIX) 20 MG TABS tablet, Take 1 tablet (20 mg total) by mouth daily.   Reviewed prior external information including notes and imaging from  primary care provider As well as notes that were available from care everywhere and other healthcare systems.  Past medical history, social, surgical and family history all reviewed in electronic medical record.  No pertanent information unless stated regarding to the chief complaint.   Review of Systems:  No headache, visual changes, nausea, vomiting, diarrhea, constipation, dizziness, abdominal pain, skin rash, fevers, chills, night sweats, weight loss, swollen lymph nodes, body aches, joint swelling, chest pain, shortness of breath, mood changes. POSITIVE muscle aches  Objective  Blood pressure 110/82, pulse 73, height 5\' 9"  (1.753 m), weight 140 lb (63.5 kg), SpO2 99 %.   General: No apparent distress alert and oriented x3 mood and affect normal, dressed appropriately.  HEENT: Pupils equal, extraocular movements intact  Respiratory: Patient's speak in full sentences and does not appear short of breath  Cardiovascular: No lower extremity edema, non tender, no erythema  Right shoulder exam does show some mild weakness with internal and external range of motion against resistance.  Positive impingement with Neer and Hawkins.  Positive O'Brien's noted.  Negative crossover test. Neck exam does have some loss of lordosis noted.  The patient does have some limitation in sidebending  right greater than left.   Limited muscular skeletal ultrasound was performed and interpreted by Antoine Primas, M  Limited ultrasound of patient's shoulder shows some mild hypoechoic changes noted.  Seems to be of the bicep tendon in the anterior and posterior labrum does have some calcific changes noted but no true acute tear appreciated.  Acromioclavicular joint appears to be unremarkable. Impression: Very mild labral aspect noted. Impression and Recommendations:     The above documentation has been reviewed and is accurate and complete Judi Saa, DO

## 2023-04-05 ENCOUNTER — Other Ambulatory Visit: Payer: Self-pay

## 2023-04-05 ENCOUNTER — Encounter: Payer: Self-pay | Admitting: Family Medicine

## 2023-04-05 ENCOUNTER — Ambulatory Visit: Payer: 59 | Admitting: Family Medicine

## 2023-04-05 ENCOUNTER — Ambulatory Visit (INDEPENDENT_AMBULATORY_CARE_PROVIDER_SITE_OTHER): Payer: 59

## 2023-04-05 VITALS — BP 110/82 | HR 73 | Ht 69.0 in | Wt 140.0 lb

## 2023-04-05 DIAGNOSIS — M25511 Pain in right shoulder: Secondary | ICD-10-CM

## 2023-04-05 DIAGNOSIS — M79601 Pain in right arm: Secondary | ICD-10-CM | POA: Insufficient documentation

## 2023-04-05 DIAGNOSIS — M542 Cervicalgia: Secondary | ICD-10-CM | POA: Diagnosis not present

## 2023-04-05 MED ORDER — GABAPENTIN 100 MG PO CAPS: 200.0000 mg | ORAL_CAPSULE | Freq: Every day | ORAL | 0 refills | Status: DC

## 2023-04-05 NOTE — Patient Instructions (Signed)
MRI Cervical and R shoulder at [facility] here today Gabapentin 200mg  called in We will be in touch

## 2023-04-05 NOTE — Assessment & Plan Note (Signed)
Concern at this time.  Patient has failed anti-inflammatories, icing regimen, notes 3 months of physical therapy without any significant improvement.  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  Unfortunately physical exam is consistent with a possible nerve root impingement.  X-rays as well as MRI for further evaluation for any other intra-articular pathology or cervical radiculopathy that could be contributing to some of the discomfort and pain at this moment.  Patient will follow-up with me again after imaging to discuss further.  Follow-up with me again after imaging to discuss different treatment.  Start gabapentin for the neuropathy aspect.

## 2023-04-11 ENCOUNTER — Other Ambulatory Visit: Payer: Self-pay | Admitting: Psychiatry

## 2023-04-11 NOTE — Telephone Encounter (Signed)
Last seen on 09/20/22 Follow up scheduled on 09/19/23

## 2023-04-17 ENCOUNTER — Ambulatory Visit: Payer: 59 | Admitting: Family

## 2023-04-17 ENCOUNTER — Encounter: Payer: Self-pay | Admitting: Family

## 2023-04-17 ENCOUNTER — Other Ambulatory Visit: Payer: Self-pay

## 2023-04-17 VITALS — BP 118/70 | HR 74 | Temp 98.1°F | Resp 16 | Wt 147.0 lb

## 2023-04-17 DIAGNOSIS — R21 Rash and other nonspecific skin eruption: Secondary | ICD-10-CM | POA: Diagnosis not present

## 2023-04-17 DIAGNOSIS — J4520 Mild intermittent asthma, uncomplicated: Secondary | ICD-10-CM

## 2023-04-17 DIAGNOSIS — K219 Gastro-esophageal reflux disease without esophagitis: Secondary | ICD-10-CM

## 2023-04-17 NOTE — Progress Notes (Signed)
[address] [place] [address] Dept: [phone]  FOLLOW UP NOTE  Patient ID: Bernard [name], male    DOB: 09-14-1986  Age: 37 y.o. MRN: [id number] Date of Office Visit: 04/17/2023  Assessment  Chief Complaint: Rash (Genital area)  HPI Bernard [name] is a 37 year old male who presents today for an acute visit of itching.  He was last seen on October 11, 2022 by Bernard [name] for mild intermittent asthma, gastroesophageal reflux disease, and history of environmental allergies-previously on allergen immunotherapy as a child with good response and now asymptomatic.  He denies any new surgeries since his last office visit.  He reports that in late May he had a hemorrhoid flareup and was treated with hydrocortisone on the surface and with a suppository.  He developed anal itching and reports that this is normal with the hemorrhoid.  The itching then moved to the perineum and then to the scrotum.  The itching on the scrotum was bad and has been bad for the past 1-1/2 weeks.  Now he has itching on the underside of his penis.  It got really bad last Thursday.  He was not able to sleep.  He was tossing and turning.  He has tried calmoseptine and petroleum jelly. He has also used witch hazel wipes and stopped using this around Thursday.  He is using a gentle lotion that is fragrance free.  He does not know the brand of lotion.  He is is using Paramedic free soap.  On Friday while at work he was distracted due to the itching, but was not scratching.  He did go to urgent care where he was diagnosed with allergic contact dermatitis genitalia.  He was prescribed hydroxyzine 10 mg 1 to 2 tablets every 4-6 hours as needed and Medrol Dosepak.  He reports he has 2 days left of the Medrol Dosepak.  He was also instructed to try Lotrimin twice a day for possible fungal infection.  He does not feel like it looks like a fungal infection to him.  The only thing that has really helped is ice. He denies any  new sexual partners and reports he is married and has a 15-year-old child.  He denies anal intercourse.  He also mentions that right before this started he was prescribed gabapentin for shoulder pain by sports medicine, but stopped this immediately and this has not helped.  No one else in his household has this rash.  He denies any insect bites or swelling.  He was not sick prior to this starting.  He does mention that he bikes 2-3 times a week for an hour and 1/2 to 2 hours at a time. Since this itching has started he has been wearing padded bibs and started using an anti-itch chafe cream.  He has also noticed that when he has the itching of his scrotum that he will have tingling all over.  He also feels like the itching has almost turned to pain.  He will also have itching at times on his back especially after he gets out of the shower.  He denies any concomitant cardiorespiratory and gastrointestinal symptoms.  Drug Allergies:  Allergies  Allergen Reactions   Mold Extract [Trichophyton]     Sneezing, runny nose.     Review of Systems: Review of Systems  Constitutional:  Negative for chills and fever.  Respiratory:  Negative for cough, shortness of breath and wheezing.   Gastrointestinal:  Negative for diarrhea, nausea and vomiting.  Genitourinary:        Denies genital swelling  Skin:  Positive for itching and rash.  Endo/Heme/Allergies:  Positive for environmental allergies.     Physical Exam: BP 118/70   Pulse 74   Temp 98.1 F (36.7 C) (Oral)   Resp 16   Wt 147 lb (66.7 kg)   SpO2 98%   BMI 21.71 kg/m    Physical Exam Exam conducted with a chaperone present (Trayce, my CMA,  was present during physical exam of genital region).  Constitutional:      Appearance: Normal appearance.  HENT:     Head: Normocephalic and atraumatic.     Right Ear: Tympanic membrane, ear canal and external ear normal.     Left Ear: Tympanic membrane, ear canal and external ear normal.     Nose:  Nose normal.     Mouth/Throat:     Mouth: Mucous membranes are moist.     Pharynx: Oropharynx is clear.  Eyes:     Conjunctiva/sclera: Conjunctivae normal.  Cardiovascular:     Rate and Rhythm: Regular rhythm.     Heart sounds: Normal heart sounds.  Pulmonary:     Effort: Pulmonary effort is normal.     Breath sounds: Normal breath sounds.     Comments: Lungs clear to auscultation Musculoskeletal:     Cervical back: Neck supple.  Skin:    General: Skin is warm.     Comments: Slight erythema noted on underside of penis. No swelling of scrotum or penis.  Excoriation marks noted on left upper back  Neurological:     Mental Status: He is alert and oriented to person, place, and time.  Psychiatric:        Mood and Affect: Mood normal.        Behavior: Behavior normal.        Thought Content: Thought content normal.        Judgment: Judgment normal.     Diagnostics:  None  Assessment and Plan: 1. Rash and nonspecific skin eruption   2. Mild intermittent asthma, uncomplicated   3. Gastroesophageal reflux disease, unspecified whether esophagitis present     No orders of the defined types were placed in this encounter.   Patient Instructions  1. Mild intermittent asthma, uncomplicated - Continue AirSupra, a new inhaler that contains albuterol combined with budesonide (a steroid). - I want you to start this two puffs every 4 hours at your next coughing/viral episode to see if it shortens the time.    2. Gastroesophageal reflux disease - Continue with the Dexilant as well as the Pepcid. - Continue dietary and lifestyle modifications as below  3. Rash/possible contact dermatitis -stop all creams/witch hazel - wash with water and mild fragrance free soap - continue prednisone as per urgent care - stop Zyrtec - Continue hydroxyzine 10 mg 1-2 tablets at night. Caution as this can make you drowsy. - Start Allegra 180 mg 1 tablet in the morning. May increase to two tablets in  the morning if needed for itching. Caution as this may cause you to be drowsy - Consider TRUE patch testing (which has hydrocortisone as one of the items). Will need to be off all steroids 4 weeks prior to appointment and no steroid creams for 2 weeks prior. -Stop exercising for now - Keep appointment with dermatology tomorrow - If continues to be a problems consider scheduling a problem with primary care physician for possible STI  Schedule a follow up appointment in 4-6  weeks or sooner if needed         Lifestyle Changes for Controlling GERD When you have GERD, stomach acid feels as if it's backing up toward your mouth. Whether or not you take medication to control your GERD, your symptoms can often be improved with lifestyle changes.   Raise Your Head Reflux is more likely to strike when you're lying down flat, because stomach fluid can flow backward more easily. Raising the head of your bed 4-6 inches can help. To do this: Slide blocks or books under the legs at the head of your bed. Or, place a wedge under the mattress. Many foam stores can make a suitable wedge for you. The wedge should run from your waist to the top of your head. Don't just prop your head on several pillows. This increases pressure on your stomach. It can make GERD worse.  Watch Your Eating Habits Certain foods may increase the acid in your stomach or relax the lower esophageal sphincter, making GERD more likely. It's best to avoid the following: Coffee, tea, and carbonated drinks (with and without caffeine) Fatty, fried, or spicy food Mint, chocolate, onions, and tomatoes Any other foods that seem to irritate your stomach or cause you pain  Relieve the Pressure Eat smaller meals, even if you have to eat more often. Don't lie down right after you eat. Wait a few hours for your stomach to empty. Avoid tight belts and tight-fitting clothes. Lose excess weight.  Tobacco and Alcohol Avoid smoking  tobacco and drinking alcohol. They can make GERD symptoms worse.    Return in about 4 weeks (around 05/15/2023).    Thank you for the opportunity to care for this patient.  Please do not hesitate to contact me with questions.  Bernard Settle, FNP [facility]

## 2023-04-17 NOTE — Patient Instructions (Addendum)
1. Mild intermittent asthma, uncomplicated - Continue AirSupra, a new inhaler that contains albuterol combined with budesonide (a steroid). - I want you to start this two puffs every 4 hours at your next coughing/viral episode to see if it shortens the time.    2. Gastroesophageal reflux disease - Continue with the Dexilant as well as the Pepcid. - Continue dietary and lifestyle modifications as below  3. Rash/possible contact dermatitis -stop all creams/witch hazel - wash with water and mild fragrance free soap - continue prednisone as per urgent care - stop Zyrtec - Continue hydroxyzine 10 mg 1-2 tablets at night. Caution as this can make you drowsy. - Start Allegra 180 mg 1 tablet in the morning. May increase to two tablets in the morning if needed for itching. Caution as this may cause you to be drowsy - Consider TRUE patch testing (which has hydrocortisone as one of the items). Will need to be off all steroids 4 weeks prior to appointment and no steroid creams for 2 weeks prior. -Stop exercising for now - Keep appointment with dermatology tomorrow - If continues to be a problems consider scheduling a problem with primary care physician for possible STI  Schedule a follow up appointment in 4-6 weeks or sooner if needed         Lifestyle Changes for Controlling GERD When you have GERD, stomach acid feels as if it's backing up toward your mouth. Whether or not you take medication to control your GERD, your symptoms can often be improved with lifestyle changes.   Raise Your Head Reflux is more likely to strike when you're lying down flat, because stomach fluid can flow backward more easily. Raising the head of your bed 4-6 inches can help. To do this: Slide blocks or books under the legs at the head of your bed. Or, place a wedge under the mattress. Many foam stores can make a suitable wedge for you. The wedge should run from your waist to the top of your head. Don't just  prop your head on several pillows. This increases pressure on your stomach. It can make GERD worse.  Watch Your Eating Habits Certain foods may increase the acid in your stomach or relax the lower esophageal sphincter, making GERD more likely. It's best to avoid the following: Coffee, tea, and carbonated drinks (with and without caffeine) Fatty, fried, or spicy food Mint, chocolate, onions, and tomatoes Any other foods that seem to irritate your stomach or cause you pain  Relieve the Pressure Eat smaller meals, even if you have to eat more often. Don't lie down right after you eat. Wait a few hours for your stomach to empty. Avoid tight belts and tight-fitting clothes. Lose excess weight.  Tobacco and Alcohol Avoid smoking tobacco and drinking alcohol. They can make GERD symptoms worse.

## 2023-04-23 ENCOUNTER — Ambulatory Visit (INDEPENDENT_AMBULATORY_CARE_PROVIDER_SITE_OTHER): Payer: 59

## 2023-04-23 DIAGNOSIS — M25511 Pain in right shoulder: Secondary | ICD-10-CM

## 2023-04-23 DIAGNOSIS — G8929 Other chronic pain: Secondary | ICD-10-CM

## 2023-04-23 DIAGNOSIS — M542 Cervicalgia: Secondary | ICD-10-CM | POA: Diagnosis not present

## 2023-04-28 ENCOUNTER — Encounter: Payer: Self-pay | Admitting: Family Medicine

## 2023-05-03 ENCOUNTER — Ambulatory Visit: Payer: 59 | Admitting: Internal Medicine

## 2023-05-04 ENCOUNTER — Encounter: Payer: 59 | Admitting: Internal Medicine

## 2023-05-16 ENCOUNTER — Ambulatory Visit: Payer: 59 | Admitting: Psychiatry

## 2023-09-19 ENCOUNTER — Ambulatory Visit: Payer: 59 | Admitting: Psychiatry

## 2023-10-27 ENCOUNTER — Encounter: Payer: Self-pay | Admitting: Internal Medicine

## 2023-11-09 ENCOUNTER — Ambulatory Visit (INDEPENDENT_AMBULATORY_CARE_PROVIDER_SITE_OTHER): Payer: Self-pay | Admitting: Sports Medicine

## 2023-11-09 VITALS — BP 132/78 | Ht 69.0 in | Wt 150.0 lb

## 2023-11-09 DIAGNOSIS — M25511 Pain in right shoulder: Secondary | ICD-10-CM | POA: Diagnosis not present

## 2023-11-09 DIAGNOSIS — M542 Cervicalgia: Secondary | ICD-10-CM

## 2023-11-09 MED ORDER — GABAPENTIN 300 MG PO CAPS: 300.0000 mg | ORAL_CAPSULE | Freq: Three times a day (TID) | ORAL | 1 refills | Status: DC

## 2023-11-09 NOTE — Progress Notes (Signed)
PCP: Etta Grandchild, MD  SUBJECTIVE:   HPI:  Patient is a 38 y.o. male here with chief complaint of right neck/shoulder pain.  Referred by Dr. Myrtis Ser ( step son)  He reports that his pain has been present for the past 12 months.  He denies any specific inciting injury though previously was very active in jujitsu and felt the pain coming on a few days after session in jujitsu while doing yoga.  Looking the pain to the anterolateral aspect of his right neck.  He is followed with sports medicine providers, orthopedic surgery, interventional pain, chiropractics, and physical therapy over the past year has yet to find a definitive answer for what is going on.  He most recently had a scalene trigger point injection on 10/31/2023 and did state that this was helpful for him.  Previous to that he had some mild improvement over the course of the past year with physical therapy/home exercises for which she has been very diligent in.  He however noted back in September 2024 he had felt as though his pain was getting worse and it got acutely worse back in December, however after his most recent trigger point injection he feels that he is back to where he was later in the fall 2024.  He has had limitation in range of motion of his right shoulder secondary to the pain in his neck.  He occasionally endorses some numbness and tingling that will radiate down into the fingers 4 and 5 of his right hand that this is rather infrequent.  He does not feel as though he is lost much strength in the right upper extremity other than what is limited by his range of motion/pain.  He has been on gabapentin 100 mg in the morning and 200 mg in the evening for the past 5 months and notes that this is somewhat helped with some periscapular pain but has not helped much with his neck pain.    Pertinent ROS were reviewed with the patient and found to be negative unless otherwise specified above in HPI.   PERTINENT  PMH / PSH / FH / SH:   Past Medical, Surgical, Social, and Family History Reviewed & Updated in the EMR.  Pertinent findings include:  Hypermobility disorder  Allergies  Allergen Reactions   Mold Extract [Trichophyton]     Sneezing, runny nose.     OBJECTIVE:  BP 132/78   Ht 5\' 9"  (1.753 m)   Wt 150 lb (68 kg)   BMI 22.15 kg/m   PHYSICAL EXAM:  GEN: Alert and Oriented, NAD, comfortable in exam room RESP: Unlabored respirations, symmetric chest rise PSY: normal mood, congruent affect   Right Shoulder/Neck MSK EXAM: Inspection -no significant muscle atrophy comparing his right and left shoulders.  He does have some mild winging of the inferior medial border of his right shoulder compared to the left.  No significant swelling, bony deformity, and fairly normal spinal curvature. Palpation-mild tenderness to palpation over the right scalene muscle, no significant tenderness across his trap or along the medial border of his scapula. ROM-right shoulder active limited range of motion is limited to about 90 degrees in both abduction and flexion, normal external and internal rotation.  Neck range of motion is full.  Range of motion of the right shoulder is full when he has retracted scapular alignment. Strength-normal strength in the right upper extremity Provocative testing-normal rotator cuff strength testing without pain.  Negative Yergason's.  Negative Adsens and Upper limb  tension test.  Negative Spurling's.   Assessment & Plan Neck pain on right side This is a fairly complicated case of neck pain.  Suspect most of his pain is neuropathic as review of his MRI from 2024 at the cervical spine does show moderate right-sided C5/C6 disc protrusion, osteophyte at C6/C7 on the right, and C3-5 mild disc protrusion however no significant stenosis or foraminal narrowing.  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  He has tolerated  gabapentin well to this point however I do not think this is sufficient dosing to adequately manage his symptoms.  We discussed a plan to uptitrate his gabapentin as in patient AVS with a goal to get to 300 mg 3 times daily.  Will follow-up with him in 6 weeks.  Can consider nerve conduction study at follow-up pending his response to increased dose of gabapentin.  Encouraged he continue right shoulder range of motion exercises in the meantime. Pain in joint of right shoulder As discussed above and I think this is primarily a neurogenic pain etiology.  Review of his MRI from 2024 does show some supraspinatus tendinopathy and partial-thickness articular surface tear of the supraspinatus however his exam testing today shows good supraspinatus strength without significant provocative discomfort.   Glean Salen, MD PGY-4, Sports Medicine Fellow [facility]  I observed and examined the patient with the resident and agree with assessment and plan.  Note reviewed and modified by me. I reviewed visits from multiple physicians as well as previous diagnostic testing.  The only impressive findings are his cervical MRI for a 38 Y/O male.  I think his findings are sufficient to possibly account for his referred pain pattern. I think he needs aggressive management of his radicular sxs to see if this is likely etiology of his sxs. I did not see any sign of hypermobility causing sxs. PE findings did not suggest TOS.  Sterling Big, MD

## 2023-11-09 NOTE — Patient Instructions (Addendum)
We discussed suspected neurogenic component of you neck pain and plan to titrate up Gabapentin dosage up to more appropriately manage this.  Schedule - 3 days of 100mg  in the AM, 300mg  at bedtime - 3 days of 200mg  in the AM, 300mg  at bedtime - 5 days of 300mg  in the AM, 300mg  at bedtime - Then transition to 300mg  in the AM, 100mg  in the midday, 300mg  at bedtime for 3 days - 3 days of 300mg  AM, 200mg  midday, 300mg  at bedtime - Goal then to get to 300mg  AM, midday, and at bedtime  Let's follow-up in 6 weeks to check in.

## 2023-11-24 ENCOUNTER — Encounter: Payer: Self-pay | Admitting: Sports Medicine

## 2023-12-21 ENCOUNTER — Ambulatory Visit: Payer: Managed Care, Other (non HMO) | Admitting: Sports Medicine

## 2023-12-21 VITALS — BP 114/82 | Ht 69.0 in | Wt 150.0 lb

## 2023-12-21 DIAGNOSIS — M79601 Pain in right arm: Secondary | ICD-10-CM

## 2023-12-21 MED ORDER — PREGABALIN 50 MG PO CAPS: 50.0000 mg | ORAL_CAPSULE | Freq: Three times a day (TID) | ORAL | 0 refills | Status: DC

## 2023-12-21 NOTE — Progress Notes (Signed)
 Patient returns for follow-up of right anterior arm pain which is really more located over his supraclavicular area  He was an active athlete participating in judo until he started having pain with any use of his right arm If he lifts it over his head or does things like working on the elliptical moving his right arm he may have pain for 2 or 3 days He has been evaluated by Dr. Ayesha Mohair by orthopedists by physical therapist and others and an MRI of his shoulder showed nothing of significance and an MRI of his neck showed multiple levels of bulging disks but no true herniation and no spinal stenosis  On his last visit I thought it reasonable to try to treat him with at least a therapeutic dose of gabapentin to see if his symptoms were referred from his cervical spine.  He saw no difference with taking 900 mg of gabapentin daily.  He continues to struggle with using his right arm and even takes his shirt off without lifting his arm above shoulder level.  Based on the changes on his cervical spine and some limited response to gabapentin we tried increasing his dose to 300 mg 3 times a day.  This made no difference in his symptoms and he is no better than when we saw him before.  He works as an Museum/gallery exhibitions officer but this chronic right arm and shoulder pain significantly limits his work activities as well as his activities of daily living.  He is not able to return to judo or sports using that arm.  Physical exam Athletic appearing white male in no acute distress He is alert, does not appear depressed and all responses are appropriate BP 114/82   Ht 5\' 9"  (1.753 m)   Wt 150 lb (68 kg)   BMI 22.15 kg/m   I observed the patient taking his shirt off and he does not elevate the right arm more than 70 degrees. On testing I can get full passive range of motion of the shoulder but it causes him some pain in his supraclavicular area when I take him to full elevation. He has good strength in the right arm but we do  not test him in shoulder elevation because of pain  On appearance to me the right supraclavicular area and the right shoulder area does look somewhat more prominent than the left and is not atrophied.  Repeated checks of his pulse in multiple positions does not reveal that his pulse disappears in any position and I can feel his pulse in his supraclavicular area. Herbst pressure test over his brachial plexus does not cause any symptoms down his arm.  Neck shows full range of motion and compression and provocative test does not cause any radicular symptoms.  Palpation of the supraclavicular area is uncomfortable for him although I did not feel any abnormalities.  Neurologically it appears that his sensation is intact to his right arm

## 2023-12-21 NOTE — Assessment & Plan Note (Signed)
 This patient seems to been a diagnostic dilemma to the various physicians who have seen him. We tried a higher dose of gabapentin in case this was referred pain from his cervical spine but that did not help. In the meantime we will try him on Lyrica 50 3 times daily to see if that makes any difference.  However am becoming more concerned that maybe this is something unusual like a vascular anomaly or some aspect of thoracic outlet syndrome. If he does not respond to the Lyrica that would make it certainly less likely to be neurogenic. Rotator cuff injury does not seem to be the case particular with his MRI being unremarkable.  I would like to refer him to a thoracic surgeon for their evaluation see if they think there is something possibly in his supraclavicular space that might be worsened by arm elevation and giving him some of the symptoms.  This may require a different type of MRI of his upper periclavicular area or other type of evaluation such as an MRA  Will try to refer him within the [facility] system as that is where his insurance is approved  We will call him to see if he is getting any response to the Lyrica.

## 2023-12-22 ENCOUNTER — Encounter: Payer: Self-pay | Admitting: *Deleted

## 2023-12-22 NOTE — Addendum Note (Signed)
 Addended by: Annita Brod on: 12/22/2023 10:25 AM   Modules accepted: Orders

## 2023-12-29 ENCOUNTER — Other Ambulatory Visit: Payer: Self-pay | Admitting: *Deleted

## 2023-12-29 ENCOUNTER — Telehealth: Payer: Self-pay | Admitting: *Deleted

## 2023-12-29 DIAGNOSIS — M79601 Pain in right arm: Secondary | ICD-10-CM

## 2023-12-29 NOTE — Telephone Encounter (Signed)
 [facility] at [address], [place], [place] [address] Phone: [phone] to sched pt to see them for possible thoracic outlet syndrome.  Spoke to [place] and she said to  fax notes to her at [phone].  Notes were faxed and Victorino Dike sent me back a fax message saying *patient will need ct chest scan prior to scheduling*.  Placed the order for their request and informed pt.  Faxed the CT order to [facility] at [phone] AND [phone].  Pt aware.

## 2023-12-29 NOTE — Addendum Note (Signed)
 Addended by: Annita Brod on: 12/29/2023 10:05 AM   Modules accepted: Orders

## 2023-12-29 NOTE — Addendum Note (Signed)
 Addended by: Annita Brod on: 12/29/2023 12:22 PM   Modules accepted: Orders

## 2024-02-02 ENCOUNTER — Encounter: Payer: Self-pay | Admitting: Sports Medicine

## 2024-02-02 ENCOUNTER — Other Ambulatory Visit: Payer: Self-pay

## 2024-02-02 DIAGNOSIS — M79601 Pain in right arm: Secondary | ICD-10-CM

## 2024-02-23 ENCOUNTER — Encounter (HOSPITAL_BASED_OUTPATIENT_CLINIC_OR_DEPARTMENT_OTHER): Payer: Self-pay | Admitting: Sports Medicine

## 2024-02-23 ENCOUNTER — Other Ambulatory Visit (HOSPITAL_BASED_OUTPATIENT_CLINIC_OR_DEPARTMENT_OTHER): Payer: Self-pay | Admitting: Sports Medicine

## 2024-02-23 DIAGNOSIS — M79601 Pain in right arm: Secondary | ICD-10-CM

## 2024-03-20 ENCOUNTER — Ambulatory Visit: Admitting: Vascular Surgery

## 2024-03-20 ENCOUNTER — Ambulatory Visit (HOSPITAL_COMMUNITY)
Admission: RE | Admit: 2024-03-20 | Discharge: 2024-03-20 | Disposition: A | Discharge status: Home / Self Care | Source: Ambulatory Visit | Attending: Vascular Surgery | Admitting: Vascular Surgery

## 2024-03-20 ENCOUNTER — Encounter: Payer: Self-pay | Admitting: Vascular Surgery

## 2024-03-20 VITALS — BP 131/87 | HR 73 | Temp 98.1°F | Ht 69.0 in | Wt 152.0 lb

## 2024-03-20 DIAGNOSIS — G540 Brachial plexus disorders: Secondary | ICD-10-CM

## 2024-03-20 DIAGNOSIS — M79601 Pain in right arm: Secondary | ICD-10-CM

## 2024-03-20 NOTE — Progress Notes (Addendum)
 Patient ID: Bernard [name], male   DOB: 04-24-1986, 38 y.o.   MRN: [id number]  Reason for Consult: New Patient (Initial Visit)   Referred by Glorine Laroche, MD  Subjective:     HPI:  Bernard [name] is a 38 y.o. male without significant vascular history.  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  He is unable to fully abduct the arm without bracing it.  He has undergone MRI of the right shoulder as well as his neck is also undergone CT angio of the right upper extremity and has completed 1 year of dedicated upper extremity physical therapy.  He has also been evaluated by orthopedic surgery, C T surgery and sports medicine.  He has a gone some sort of neuromuscular block in the right shoulder in the past with limited resolution of symptoms.  He states that he became weaker with physical therapy.  He has tried gabapentin  at the discretion of sports medicine but this also gave him no relief.  He denies any history of antecedent trauma to the neck or upper extremity.  He states that he was evaluated by CT surgery at [facility] and ultimately has been referred to vascular surgery both here and at [facility] where he has an appointment next week.  It has been noted that he has discoloration of his right hand with cyanotic appearing changes relative to the left.  He also occasionally has numbness of his right fourth and fingers.  He states that he has a family history of Ehlers-Danlos but himself does not have any notable history or hypermobility issues.  He currently is studying to become a PA.  Past Medical History:  Diagnosis Date   Fundic gland polyposis of stomach    GERD (gastroesophageal reflux disease)    Hemorrhoid    Sebaceous cyst    Family History  Problem Relation Age of Onset   Allergic rhinitis Mother    Heart disease Father        valve replacement   Hypertension Father    GER disease Father    Dementia  Father        early onset   Allergic rhinitis Sister    Breast cancer Maternal Grandmother    Colon cancer Neg Hx    Past Surgical History:  Procedure Laterality Date   ESOPHAGEAL MANOMETRY N/A 10/09/2019   Procedure: ESOPHAGEAL MANOMETRY (EM);  Surgeon: Kenney Peacemaker, MD;  Location: [facility];  Service: Endoscopy;  Laterality: N/A;   ESOPHAGOGASTRODUODENOSCOPY N/A 05/18/2015   Procedure: ESOPHAGOGASTRODUODENOSCOPY (EGD);  Surgeon: Claudette Cue, MD;  Location: [facility];  Service: Endoscopy;  Laterality: N/A;   HEMORRHOID BANDING     PH IMPEDANCE STUDY  10/09/2019   Procedure: PH IMPEDANCE STUDY;  Surgeon: Kenney Peacemaker, MD;  Location: [facility];  Service: Endoscopy;;   WISDOM TOOTH EXTRACTION      Short Social History:  Social History   Tobacco Use   Smoking status: Former    Current packs/day: 0.00    Average packs/day: 1 pack/day for 10.0 years (10.0 ttl pk-yrs)    Types: E-cigarettes, Cigarettes    Start date: 11/24/2002    Quit date: 11/24/2012    Years since quitting: 11.3    Passive exposure: Never   Smokeless tobacco: Never   Tobacco comments:    quit e cigarette 2016  Substance Use Topics   Alcohol use: Yes  Alcohol/week: 0.0 standard drinks of alcohol    Comment: occasional    Allergies  Allergen Reactions   Mold Extract [Trichophyton]     Sneezing, runny nose.     Current Outpatient Medications  Medication Sig Dispense Refill   cetirizine (ZYRTEC) 10 MG tablet Take 10 mg by mouth every morning.      dexlansoprazole  (DEXILANT ) 60 MG capsule Take 1 capsule (60 mg total) by mouth daily. 100 capsule 3   diclofenac  (CATAFLAM ) 50 MG tablet Take 1 to 2 tablets by mouth at onset of headache as needed. **Maximum dose of 2 tablets in 24 hours** 15 tablet 1   famotidine  (PEPCID ) 20 MG tablet Take 1 tablet by mouth twice daily. 180 tablet 2   pregabalin  (LYRICA ) 50 MG capsule Take 1 capsule (50 mg total) by mouth 3 (three) times daily. 270 capsule 0    psyllium (METAMUCIL) 58.6 % packet Take 1 packet by mouth daily.     topiramate  (TOPAMAX ) 100 MG tablet Take 1 tablet (100 mg total) by mouth at bedtime. 90 tablet 1   TURMERIC PO Take by mouth.     No current facility-administered medications for this visit.    Review of Systems  Constitutional:  Constitutional negative. HENT: HENT negative.  Eyes: Eyes negative.  Cardiovascular: Cardiovascular negative.  GI: Gastrointestinal negative.  Musculoskeletal: Positive for joint pain.  Skin: Skin negative.  Neurological: Positive for focal weakness and numbness.  Hematologic: Hematologic/lymphatic negative.  Psychiatric: Psychiatric negative.        Objective:  Objective   Vitals:   03/20/24 0946  BP: 131/87  Pulse: 73  Temp: 98.1 F (36.7 C)  SpO2: 98%  Weight: 152 lb (68.9 kg)  Height: 5\' 9"  (1.753 m)   Body mass index is 22.45 kg/m.  Physical Exam HENT:     Head: Normocephalic.     Nose: Nose normal.     Mouth/Throat:     Mouth: Mucous membranes are moist.  Eyes:     Pupils: Pupils are equal, round, and reactive to light.  Pulmonary:     Effort: Pulmonary effort is normal.  Abdominal:     General: Abdomen is flat.  Musculoskeletal:     Comments: Patient does not have any supraclavicular tenderness to palpation As patient has severe limitation to elevating his arms Adson's test was not performed  Skin:    General: Skin is warm.     Capillary Refill: Capillary refill takes less than 2 seconds. The right hand is red discolored relative to the left with brisk capillary refill Neurological:     General: No focal deficit present.     Mental Status: He is alert.  Psychiatric:        Mood and Affect: Mood normal.     Data: Right Doppler Findings:  +--------+--------+-----+---------+--------+  Site   PressureIndexDoppler  Comments  +--------+--------+-----+---------+--------+  [id number]         triphasic           +--------+--------+-----+---------+--------+  Radial 121     0.87 triphasic          +--------+--------+-----+---------+--------+  Ulnar  143     1.03 triphasic          +--------+--------+-----+---------+--------+      Left Doppler Findings:  +--------+--------+-----+---------+--------+  Site   PressureIndexDoppler  Comments  +--------+--------+-----+---------+--------+  [id number]         triphasic          +--------+--------+-----+---------+--------+  Radial 144     1.04  triphasic          +--------+--------+-----+---------+--------+  Ulnar  168     1.21 triphasic          +--------+--------+-----+---------+--------+      TOS Maneuvers:  +--------------------+---------------+--------------------+  Right 1st Digit FlowManuever       Left 1st Digit Flow   +--------------------+---------------+--------------------+  No change           Adson's-Right  No change             +--------------------+---------------+--------------------+  No change           Adson's-Left   No change             +--------------------+---------------+--------------------+  No change           Soldier's      No change             +--------------------+---------------+--------------------+  Obliteration of flowAbduction      Obliteration of flow  +--------------------+---------------+--------------------+  Diminished flow     Hyper-AbductionDiminished flow       +--------------------+---------------+--------------------+     Summary:    Right: See table above for positional maneuver changes.  Left: See table above for positional maneuver changes.  *See table(s) above for measurements and observations.   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  He does have discoloration of the right hand which has brisk capillary refill also has easily palpable radial pulses bilaterally and this could potentially be due to under lying neurogenic vasospasm and there is also obliteration of flow in his first digit with abduction of the arm although this is present bilaterally.  No antecedent history of trauma to the neck or upper extremity.  We discussed proceeding with EMG although I think this would be less specific in his case and we also discussed that he has completed 1 year of physical therapy.  The next best step in my opinion would be to proceed with anterior scalene block to determine if his symptoms resolve temporarily.  I did discuss that he may not have significant benefit from first rib resection given that his symptoms have been ongoing for greater than 1 year and that he already presents with significant limitations in right shoulder mobility.  Plan is to obtain a second opinion at [facility] in a couple of weeks and he can call to follow-up here as needed and we would first schedule right anterior scalene block prior to any further discussions of first rib resection.     Adine Hoof MD [facility]

## 2024-03-21 ENCOUNTER — Encounter: Payer: Self-pay | Admitting: Vascular Surgery

## 2024-04-14 ENCOUNTER — Encounter: Payer: Self-pay | Admitting: Sports Medicine

## 2024-04-14 ENCOUNTER — Other Ambulatory Visit: Payer: Self-pay | Admitting: Sports Medicine

## 2024-04-15 ENCOUNTER — Encounter: Payer: Self-pay | Admitting: Vascular Surgery

## 2024-04-16 ENCOUNTER — Other Ambulatory Visit: Payer: Self-pay | Admitting: Sports Medicine

## 2024-04-16 MED ORDER — PREGABALIN 50 MG PO CAPS: 50.0000 mg | ORAL_CAPSULE | Freq: Three times a day (TID) | ORAL | 0 refills | Status: DC

## 2024-05-07 ENCOUNTER — Ambulatory Visit
Attending: Student in an Organized Health Care Education/Training Program | Admitting: Student in an Organized Health Care Education/Training Program

## 2024-05-07 ENCOUNTER — Encounter: Payer: Self-pay | Admitting: Student in an Organized Health Care Education/Training Program

## 2024-05-07 VITALS — BP 128/89 | HR 66 | Temp 98.6°F | Resp 16 | Ht 69.0 in | Wt 152.0 lb

## 2024-05-07 DIAGNOSIS — G894 Chronic pain syndrome: Secondary | ICD-10-CM | POA: Diagnosis not present

## 2024-05-07 DIAGNOSIS — G540 Brachial plexus disorders: Secondary | ICD-10-CM | POA: Insufficient documentation

## 2024-05-07 DIAGNOSIS — M79601 Pain in right arm: Secondary | ICD-10-CM | POA: Diagnosis present

## 2024-05-07 NOTE — Patient Instructions (Signed)

## 2024-05-07 NOTE — Progress Notes (Signed)
 PROVIDER NOTE: Interpretation of information contained herein should be left to medically-trained personnel. Specific patient instructions are provided elsewhere under Patient Instructions section of medical record. This document was created in part using AI and STT-dictation technology, any transcriptional errors that may result from this process are unintentional.  Patient: Bernard [name]  Service: E/M Encounter  Provider: Wallie Sherry, MD  DOB: 14-Mar-1986  Delivery: Face-to-face  Specialty: Interventional Pain Management  MRN: [id number]  Setting: Ambulatory outpatient facility  Specialty designation: 09  Type: New Patient  Location: Outpatient office facility  PCP: Joshua Debby CROME, MD  DOS: 05/07/2024    Referring Prov.: Sheree Penne Palma*   Primary Reason(s) for Visit: Encounter for initial evaluation of one or more chronic problems (new to examiner) potentially causing chronic pain, and posing a threat to normal musculoskeletal function. (Level of risk: High) CC: Shoulder Pain (right)  HPI  [name] is a 38 y.o. year old, male patient, who comes for the first time to our practice referred by Sheree Penne Palma* for our initial evaluation of his chronic pain. 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 Today he comes in for evaluation of his Shoulder Pain (right)  Pain Assessment: Location: Right  Shoulder Radiating: occasional numbness and tingling in right finger Onset: More than a month ago Duration: Chronic pain Quality: Aching, Burning, Constant Severity: 1 /10 (subjective, self-reported pain score)  Effect on ADL:   Timing: Constant Modifying factors: TENS, walking BP: 128/89  HR: 66  Onset and Duration: Gradual Cause of pain: Unknown Severity: Getting worse, NAS-11 at its worse: 8/10, NAS-11 at its best: 2/10, NAS-11 now: 2/10, and NAS-11 on the average: 4/10 Timing: Morning, Night, After activity or exercise, and After a period of immobility Aggravating Factors: Climbing, Eating, Lifiting, Motion, and Working Alleviating Factors: TENS and Walking Associated Problems: Color changes, Depression, Fatigue, Numbness, Sadness, Tingling, Weakness, Pain that wakes patient up, and Pain that does not allow patient to sleep Quality of Pain: Aching, Agonizing, Annoying, Burning, Constant, Intermittent, Cruel, Deep, Disabling, Distressing, Dreadful, Dull, Exhausting, Fearful, Feeling of constriction, Getting longer, Horrible, Punishing, Tiring, and Uncomfortable Previous Examinations or Tests: CT scan, MRI scan, X-rays, Orthopedic evaluation, and Chiropractic evaluation Previous Treatments: Chiropractic manipulations, Physical Therapy, TENS, and Trigger point injections  Mr. Veltre is being evaluated for possible interventional pain management therapies for the treatment of his chronic pain.   Discussed the use of AI scribe software for clinical note transcription with the patient, who gave verbal consent to proceed.  History of Present Illness   Bernard [name] is a 38 year old male who presents for evaluation of neurogenic thoracic outlet syndrome (TOS). He was referred by Dr. cain for evaluation of neurogenic thoracic outlet syndrome (TOS).  He has been experiencing pain in the right shoulder area for approximately 18 to 19 months, exacerbated by excessive or repetitive  movements, particularly when raising his arm above 90 degrees or lifting heavy weights. The pain develops over time rather than occurring instantly.  The pain has migrated over time, initially starting in the shoulder and then  moving to the scalene muscles, above the pectoral muscles, and the neck. He has undergone extensive evaluations, including consultations with multiple specialists and various imaging studies. He has also completed a year of physical therapy.  In January, he began researching neurogenic thoracic outlet syndrome (TOS) and pursued this diagnosis, which led him to Dr. cain. He has previously had a trigger point injection in the anterior scalene muscle, which was not performed under ultrasound guidance.  He has no specific injury but has a history of practicing jiu-jitsu, which involved repetitive movements and pressure in the neck area. He has a family history of Ehlers-Danlos syndrome, with his grandmother and cousin's son affected, but he does not exhibit hypermobility or symptoms of Ehlers-Danlos himself.  Pain may develop an hour later and persist for two to three days. No history of POTS or related symptoms.       Meds   Current Outpatient Medications:    cetirizine (ZYRTEC) 10 MG tablet, Take 10 mg by mouth every morning. , Disp: , Rfl:    dexlansoprazole  (DEXILANT ) 60 MG capsule, Take 1 capsule (60 mg total) by mouth daily., Disp: 100 capsule, Rfl: 3   famotidine  (PEPCID ) 20 MG tablet, Take 1 tablet by mouth twice daily., Disp: 180 tablet, Rfl: 2   pregabalin  (LYRICA ) 50 MG capsule, Take 1 capsule (50 mg total) by mouth 3 (three) times daily., Disp: 270 capsule, Rfl: 0   psyllium (METAMUCIL) 58.6 % packet, Take 1 packet by mouth daily., Disp: , Rfl:    topiramate  (TOPAMAX ) 100 MG tablet, Take 1 tablet (100 mg total) by mouth at bedtime., Disp: 90 tablet, Rfl: 1   diclofenac  (CATAFLAM ) 50 MG tablet, Take 1 to 2 tablets by mouth at onset of headache as needed. **Maximum  dose of 2 tablets in 24 hours** (Patient not taking: Reported on 05/07/2024), Disp: 15 tablet, Rfl: 1   TURMERIC PO, Take by mouth. (Patient not taking: Reported on 05/07/2024), Disp: , Rfl:   Imaging Review  Cervical Imaging: Cervical MR wo contrast: Results for orders placed in visit on 04/23/23  MR CERVICAL SPINE WO CONTRAST  Narrative CLINICAL DATA:  Initial evaluation for cervical spine pain.  EXAM: MRI CERVICAL SPINE WITHOUT CONTRAST  TECHNIQUE: Multiplanar, multisequence MR imaging of the cervical spine was performed. No intravenous contrast was administered.  COMPARISON:  Radiograph from 04/05/2023.  FINDINGS: Alignment: Straightening of the normal cervical lordosis. No listhesis.  Vertebrae: Vertebral body height maintained without acute or chronic fracture. Bone marrow signal intensity within normal limits. No discrete or worrisome osseous lesions. No abnormal marrow edema.  Cord: Normal signal and morphology.  Posterior Fossa, vertebral arteries, paraspinal tissues: In a unremarkable.  Disc levels:  C2-C3: : Unremarkable.  C3-C4: Mild disc bulge with uncovertebral spurring. Normal right paracentral annular fissure. No spinal stenosis. Foramina remain patent.  C4-C5: Right eccentric disc bulge with uncovertebral spurring. Flattening of the ventral thecal sac without significant spinal stenosis. Foramina remain patent.  C5-C6: Degenerative intervertebral disc space narrowing with diffuse disc bulge and uncovertebral spurring. Superimposed right paracentral disc protrusion indents the right ventral thecal sac (series 6, image 18). Minimal cord flattening without cord signal changes. Thecal sac remains adequately patent. Mild to moderate right C6 foraminal narrowing. Left neural foramen remains patent.  C6-C7: Degenerate intervertebral disc space narrowing with diffuse disc osteophyte complex. Mild flattening of the ventral thecal sac without significant  spinal stenosis. Mild right with mild to moderate left C7 foraminal narrowing.  C7-T1:  Unremarkable.  IMPRESSION: 1.  Right paracentral disc protrusion at C5-6 with secondary mild cord flattening, but no cord signal changes. Mild to moderate right C6 foraminal narrowing at this level. 2. Disc osteophyte complex at C6-7 with resultant mild right and mild to moderate left C7 foraminal stenosis. 3. Additional mild noncompressive disc bulging at C3-4 and C4-5 without significant stenosis or impingement.   Electronically Signed By: Morene Hoard M.D. On: 04/25/2023 18:27    DG Cervical Spine 2 or 3 views  Narrative CLINICAL DATA:  Pain  EXAM: CERVICAL SPINE - 2-3 VIEW  COMPARISON:  None Available.  FINDINGS: There is no evidence of cervical spine fracture or prevertebral soft tissue swelling. Alignment is normal. No other significant bone abnormalities are identified.  IMPRESSION: Negative cervical spine radiographs.   Electronically Signed By: Alm Pouch III M.D. On: 04/06/2023 15:45   MR SHOULDER RIGHT WO CONTRAST  Narrative CLINICAL DATA:  Chronic right shoulder pain for 6 months  EXAM: MRI OF THE RIGHT SHOULDER WITHOUT CONTRAST  TECHNIQUE: Multiplanar, multisequence MR imaging of the shoulder was performed. No intravenous contrast was administered.  COMPARISON:  None Available.  FINDINGS: Rotator cuff: Mild tendinosis of the supraspinatus tendon with a small partial-thickness articular surface tear. Infraspinatus tendon is intact. Teres minor tendon is intact. Subscapularis tendon is intact.  Muscles: No muscle atrophy or edema. No intramuscular fluid collection or hematoma.  Biceps Long Head: Intraarticular and extraarticular portions of the biceps tendon are intact.  Acromioclavicular Joint: Mild arthropathy of the acromioclavicular joint. No subacromial/subdeltoid bursal fluid.  Glenohumeral Joint: No joint effusion. No chondral  defect.  Labrum: Grossly intact, but evaluation is limited by lack of intraarticular fluid/contrast.  Bones: No fracture or dislocation. No aggressive osseous lesion.  Other: No fluid collection or hematoma.  IMPRESSION: 1. Mild tendinosis of the supraspinatus tendon with a small partial-thickness articular surface tear.   Electronically Signed By: Julaine Blanch M.D. On: 04/30/2023 07:19  DG Shoulder Right  Narrative CLINICAL DATA:  Pain  EXAM: RIGHT SHOULDER - 2+ VIEW  COMPARISON:  None Available.  FINDINGS: There is no evidence of fracture or dislocation. There is no evidence of arthropathy or other focal bone abnormality. Soft tissues are unremarkable.  IMPRESSION: Negative.   Electronically Signed By: Alm Pouch III M.D. On: 04/06/2023 15:45    Complexity Note: Imaging results reviewed.                         ROS  Cardiovascular: No reported cardiovascular signs or symptoms such as High blood pressure, coronary artery disease, abnormal heart rate or rhythm, heart attack, blood thinner therapy or heart weakness and/or failure Pulmonary or Respiratory: No reported pulmonary signs or symptoms such as wheezing and difficulty taking a deep full breath (Asthma), difficulty blowing air out (Emphysema), coughing up mucus (Bronchitis), persistent dry cough, or temporary stoppage of breathing during sleep Neurological: No reported neurological signs or symptoms such as seizures, abnormal skin sensations, urinary and/or fecal incontinence, being born with an abnormal open spine and/or a tethered spinal cord Psychological-Psychiatric: No reported psychological or psychiatric signs or symptoms such as difficulty sleeping, anxiety, depression, delusions or hallucinations (schizophrenial), mood swings (bipolar disorders) or suicidal ideations or attempts Gastrointestinal: Heartburn due to stomach pushing into lungs (Hiatal hernia) Genitourinary: No reported renal or  genitourinary signs or symptoms such as difficulty voiding or producing urine, peeing blood, non-functioning kidney, kidney stones, difficulty emptying the bladder, difficulty controlling the flow of urine, or chronic kidney disease  Hematological: No reported hematological signs or symptoms such as prolonged bleeding, low or poor functioning platelets, bruising or bleeding easily, hereditary bleeding problems, low energy levels due to low hemoglobin or being anemic Endocrine: No reported endocrine signs or symptoms such as high or low blood sugar, rapid heart rate due to high thyroid  levels, obesity or weight gain due to slow thyroid  or thyroid  disease Rheumatologic: No reported rheumatological signs and symptoms such as fatigue, joint pain, tenderness, swelling, redness, heat, stiffness, decreased range of motion, with or without associated rash Musculoskeletal: Negative for myasthenia gravis, muscular dystrophy, multiple sclerosis or malignant hyperthermia  Allergies  Mr. Shedden is allergic to mold extract [trichophyton].  Laboratory Chemistry Profile   Renal Lab Results  Component Value Date   BUN 19 08/02/2022   CREATININE 1.06 08/02/2022   GFR 89.88 08/02/2022   GFRAA >60 05/01/2015   GFRNONAA >60 05/01/2015   PROTEINUR NEGATIVE 05/01/2015     Electrolytes Lab Results  Component Value Date   NA 137 08/02/2022   K 4.2 08/02/2022   CL 104 08/02/2022   CALCIUM 9.7 08/02/2022     Hepatic Lab Results  Component Value Date   AST 17 07/14/2021   ALT 21 07/14/2021   ALBUMIN 4.3 07/14/2021   ALKPHOS 47 07/14/2021     ID Lab Results  Component Value Date   HIV NON-REACTIVE 09/17/2019   SARSCOV2NAA NOT DETECTED 10/04/2019   HCVAB <0.1 06/17/2017     Bone Lab Results  Component Value Date   VD25OH 41.26 07/14/2021   TESTOSTERONE  320.62 08/13/2019     Endocrine Lab Results  Component Value Date   GLUCOSE 103 (H) 08/02/2022   GLUCOSEU NEGATIVE 03/15/2018   HGBA1C 5.6  02/28/2017   TSH 1.00 07/14/2021   TESTOSTERONE  320.62 08/13/2019   CRTSLPL 8.8 08/13/2019     Neuropathy Lab Results  Component Value Date   HGBA1C 5.6 02/28/2017   HIV NON-REACTIVE 09/17/2019     CNS No results found for: COLORCSF, APPEARCSF, RBCCOUNTCSF, WBCCSF, POLYSCSF, LYMPHSCSF, EOSCSF, PROTEINCSF, GLUCCSF, JCVIRUS, CSFOLI, IGGCSF, LABACHR, ACETBL   Inflammation (CRP: Acute  ESR: Chronic) Lab Results  Component Value Date   CRP <1.0 08/13/2019   ESRSEDRATE 2 07/14/2021     Rheumatology Lab Results  Component Value Date   RF <14 08/13/2019   ANA NEGATIVE 08/13/2019   LABURIC 5.7 08/13/2019     Coagulation Lab Results  Component Value Date   PLT 301.0 08/02/2022     Cardiovascular Lab Results  Component Value Date   HGB 15.6 08/02/2022   HCT 46.0 08/02/2022     Screening Lab Results  Component Value Date   SARSCOV2NAA NOT DETECTED 10/04/2019   COVIDSOURCE NASOPHARYNGEAL 10/04/2019   HCVAB <0.1 06/17/2017   HIV NON-REACTIVE 09/17/2019     Cancer No results found for: CEA, CA125, LABCA2   Allergens No results found for: ALMOND, APPLE, ASPARAGUS, AVOCADO, BANANA, BARLEY, BASIL, BAYLEAF, GREENBEAN, LIMABEAN, WHITEBEAN, BEEFIGE, REDBEET, BLUEBERRY, BROCCOLI, CABBAGE, MELON, CARROT, CASEIN, CASHEWNUT, CAULIFLOWER, CELERY     Note: Lab results reviewed.  PFSH  Drug: Mr. Harrel  reports no history of drug use. Alcohol:  reports current alcohol use. Tobacco:  reports that he quit smoking about 11 years ago. His smoking use included e-cigarettes and cigarettes. He started smoking about 21 years ago. He has a 10 pack-year smoking history. He has never been exposed to tobacco smoke. He has never used smokeless tobacco. Medical:  has a past medical history of Fundic gland polyposis of  stomach, GERD (gastroesophageal reflux disease), Hemorrhoid, and Sebaceous cyst. Family: family  history includes Allergic rhinitis in his mother and sister; Breast cancer in his maternal grandmother; Dementia in his father; GER disease in his father; Heart disease in his father; Hypertension in his father.  Past Surgical History:  Procedure Laterality Date   ESOPHAGEAL MANOMETRY N/A 10/09/2019   Procedure: ESOPHAGEAL MANOMETRY (EM);  Surgeon: Avram Lupita BRAVO, MD;  Location: [facility];  Service: Endoscopy;  Laterality: N/A;   ESOPHAGOGASTRODUODENOSCOPY N/A 05/18/2015   Procedure: ESOPHAGOGASTRODUODENOSCOPY (EGD);  Surgeon: Lamar JONETTA Aho, MD;  Location: [facility];  Service: Endoscopy;  Laterality: N/A;   HEMORRHOID BANDING     PH IMPEDANCE STUDY  10/09/2019   Procedure: PH IMPEDANCE STUDY;  Surgeon: Avram Lupita BRAVO, MD;  Location: [facility];  Service: Endoscopy;;   WISDOM TOOTH EXTRACTION     Active Ambulatory Problems    Diagnosis Date Noted   GASTROESOPHAGEAL REFLUX DISEASE 12/29/2007   Prolapsed internal hemorrhoids, grade 2 12/23/2015   Patellofemoral syndrome of both knees 06/09/2017   Benign hypermobility syndrome 06/09/2017   Functional heartburn    Encounter for general adult medical examination with abnormal findings 07/14/2021   Gastroesophageal reflux disease with esophagitis without hemorrhage 07/14/2021   Chronic tension-type headache, intractable 02/01/2022   Seasonal allergic rhinitis due to pollen 08/03/2022   Cough variant asthma 08/03/2022   Current moderate episode of major depressive disorder without prior episode (HCC) 02/01/2023   Right arm pain 04/05/2023   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 05/07/2024   Chronic pain syndrome 05/07/2024   Resolved Ambulatory Problems    Diagnosis Date Noted   CIGARETTE SMOKER 01/08/2008   JAW PAIN 08/11/2008   Pruritus ani 09/22/2014   Scalp mass 02/16/2015   Routine general medical examination at a health care facility 02/16/2015   Cervical strain, acute 02/16/2015   Perianal  cellulitis 04/13/2016   Hyperglycemia 02/28/2017   Nicotine dependence 02/28/2017   Essential hypertension 03/15/2018   Vitamin D  insufficiency 03/16/2018   Injury of right upper extremity 05/01/2018   Other chest pain 06/11/2018   Rib pain on left side 04/30/2019   Tinea corporis 05/20/2020   Skin lesion of face 04/25/2021   Tension headache 07/14/2021   Chronic fatigue 07/14/2021   Vitamin D  deficiency 07/14/2021   Subacute cough 08/02/2022   Past Medical History:  Diagnosis Date   Fundic gland polyposis of stomach    GERD (gastroesophageal reflux disease)    Hemorrhoid    Sebaceous cyst    Constitutional Exam  General appearance: Well nourished, well developed, and well hydrated. In no apparent acute distress Vitals:   05/07/24 0902  BP: 128/89  Pulse: 66  Resp: 16  Temp: 98.6 F (37 C)  TempSrc: Temporal  SpO2: 100%  Weight: 152 lb (68.9 kg)  Height: 5' 9 (1.753 m)   BMI Assessment: Estimated body mass index is 22.45 kg/m as calculated from the following:   Height as of this encounter: 5' 9 (1.753 m).   Weight as of this encounter: 152 lb (68.9 kg).  BMI interpretation table: BMI level Category Range association with higher incidence of chronic pain  <18 kg/m2 Underweight   18.5-24.9 kg/m2 Ideal body weight   25-29.9 kg/m2 Overweight Increased incidence by 20%  30-34.9 kg/m2 Obese (Class I) Increased incidence by 68%  35-39.9 kg/m2 Severe obesity (Class II) Increased incidence by 136%  >40 kg/m2 Extreme obesity (Class III) Increased incidence by 254%   Patient's current BMI  Ideal Body weight  Body mass index is 22.45 kg/m. 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 Diagnoses of Right arm pain and Chronic pain syndrome were also pertinent to this visit.  Visit Diagnosis (New problems to examiner): 1. 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 Right arm pain   3. Chronic pain syndrome    Plan of Care (Initial workup plan)  Assessment and Plan    Neurogenic Thoracic Outlet Syndrome   Chronic right shoulder pain has persisted for 18-19 months, worsening with repetitive or excessive movement, especially when raising the arm above 90 degrees or lifting heavy weights. The pain has spread from the shoulder to the scalene, above the pectorals, and the neck. Neurogenic thoracic outlet syndrome is suspected due to possible brachial plexus compression in the thoracic outlet. No specific injury is reported, but he practices jiu-jitsu with repetitive neck pressure. There is a family history of Ehlers-Danlos syndrome, but he shows no personal hypermobility or POTS symptoms. Schedule a right scalene block for neurogenic TOS on Monday, August 4th. Follow up with Dr. Sheree to discuss the response to the scalene block and consider potential surgical intervention.   Potential cervical radiculopathy: Patient also has MRI evidence of right C6 foraminal narrowing with a disc osteophyte complex at C6-C7 with moderate right C7 narrowing as well.  Future considerations could also include cervical epidural steroid injection        Provider-requested follow-up: Return in about 20 days (around 05/27/2024) for Right scalene block (NTOS) , in clinic [facility].  No future appointments. I discussed the assessment and  treatment plan with the patient. The patient was provided an opportunity to ask questions and all were answered. The patient agreed with the plan and demonstrated an understanding of the instructions.  Patient advised to call back or seek an in-person evaluation if the symptoms or condition worsens.  Duration of encounter: .  Total time on encounter, as per AMA guidelines included both the face-to-face and non-face-to-face time personally spent by the physician and/or other qualified health care professional(s) on the day of the encounter (includes time in activities that require the physician or other qualified health care professional and does not include time in activities normally performed by clinical staff). Physician's time may include the following activities when performed: Preparing to see the patient (e.g., pre-charting review of records, searching for previously ordered imaging, lab work, and nerve conduction tests) Review of prior analgesic pharmacotherapies. Reviewing PMP Interpreting ordered tests (e.g., lab work, imaging, nerve conduction tests) Performing post-procedure evaluations, including interpretation of diagnostic procedures Obtaining and/or reviewing separately obtained history Performing a medically appropriate examination and/or evaluation Counseling and educating the patient/family/caregiver Ordering medications, tests, or procedures Referring and communicating with other health care professionals (when not separately reported) Documenting clinical information in the  electronic or other health record Independently interpreting results (not separately reported) and communicating results to the patient/ family/caregiver Care coordination (not separately reported)  Note by: Wallie Sherry, MD (TTS and AI technology used. I apologize for any typographical errors that were not detected and corrected.) Date: 05/07/2024; Time: 10:12 AM

## 2024-05-27 ENCOUNTER — Ambulatory Visit
Attending: Student in an Organized Health Care Education/Training Program | Admitting: Student in an Organized Health Care Education/Training Program

## 2024-05-27 ENCOUNTER — Encounter: Payer: Self-pay | Admitting: Student in an Organized Health Care Education/Training Program

## 2024-05-27 DIAGNOSIS — G894 Chronic pain syndrome: Secondary | ICD-10-CM | POA: Diagnosis present

## 2024-05-27 DIAGNOSIS — G540 Brachial plexus disorders: Secondary | ICD-10-CM | POA: Diagnosis not present

## 2024-05-27 DIAGNOSIS — M79601 Pain in right arm: Secondary | ICD-10-CM | POA: Diagnosis not present

## 2024-05-27 MED ORDER — LIDOCAINE HCL 2 % IJ SOLN
20.0000 mL | Freq: Once | INTRAMUSCULAR | Status: AC
  Administered 2024-05-27: 400 mg

## 2024-05-27 MED ORDER — DEXAMETHASONE SODIUM PHOSPHATE 10 MG/ML IJ SOLN
INTRAMUSCULAR | Status: AC
  Filled 2024-05-27: qty 1

## 2024-05-27 MED ORDER — DEXAMETHASONE SODIUM PHOSPHATE 10 MG/ML IJ SOLN
10.0000 mg | Freq: Once | INTRAMUSCULAR | Status: AC
  Administered 2024-05-27: 10 mg

## 2024-05-27 MED ORDER — ROPIVACAINE HCL 2 MG/ML IJ SOLN
9.0000 mL | Freq: Once | INTRAMUSCULAR | Status: AC
  Administered 2024-05-27: 9 mL via PERINEURAL

## 2024-05-27 MED ORDER — ROPIVACAINE HCL 2 MG/ML IJ SOLN
INTRAMUSCULAR | Status: AC
  Filled 2024-05-27: qty 20

## 2024-05-27 MED ORDER — LIDOCAINE HCL 2 % IJ SOLN
INTRAMUSCULAR | Status: AC
  Filled 2024-05-27: qty 20

## 2024-05-27 NOTE — Progress Notes (Signed)
 PROVIDER NOTE: Interpretation of information contained herein should be left to medically-trained personnel. Specific patient instructions are provided elsewhere under Patient Instructions section of medical record. This document was created in part using STT-dictation technology, any transcriptional errors that may result from this process are unintentional.  Patient: Bernard [name] Type: Established DOB: 1986-02-20 MRN: [id number] PCP: Joshua Debby CROME, MD  Service: Procedure DOS: 05/27/2024 Setting: Ambulatory Location: Ambulatory outpatient facility Delivery: Face-to-face Provider: Wallie Sherry, MD Specialty: Interventional Pain Management Specialty designation: 09 Location: Outpatient facility Ref. Prov.: Sherry Wallie, MD       Interventional Therapy   Primary Reason for Visit: Interventional Pain Management Treatment. CC: Shoulder Pain (right) and Neck Pain (right)    Procedure:          Anesthesia, Analgesia, Anxiolysis:  Type: Right scalene nerve block for potential neurogenic thoracic outlet syndrome  Anesthesia: Local (1-2% Lidocaine )  Anxiolysis: None  Sedation: None  Guidance: Ultrasound           Position: Supine   1. 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 Right arm pain   3. Chronic pain syndrome    NAS-11 Pain score:   Pre-procedure: 3 /10   Post-procedure: 0-No pain/10     H&P (Pre-op Assessment):  Bernard [name] is a 38 y.o. (year old), male patient, seen today for interventional treatment. He  has a past surgical history that includes Esophagogastroduodenoscopy (N/A, 05/18/2015); Wisdom tooth extraction; Esophageal manometry (N/A, 10/09/2019); PH impedance study (10/09/2019); and Hemorrhoid banding. Bernard [name] has a current medication list which includes the following prescription(s): cetirizine, dexlansoprazole , famotidine , pregabalin , psyllium, topiramate , diclofenac , and turmeric. His primarily concern today is the Shoulder Pain  (right) and Neck Pain (right)  Initial Vital Signs:  Pulse/HCG Rate: 66ECG Heart Rate: 62 Temp: 98.2 F (36.8 C) Resp: 16 BP: 121/82 SpO2: 100 %  BMI: Estimated body mass index is 22.15 kg/m as calculated from the following:   Height as of this encounter: 5' 9 (1.753 m).   Weight as of this encounter: 150 lb (68 kg).  Risk Assessment: Allergies: Reviewed. He is allergic to mold extract [trichophyton].  Allergy Precautions: None required Coagulopathies: Reviewed. None identified.  Blood-thinner therapy: None at this time Active Infection(s): Reviewed. None identified. Bernard [name] is afebrile  Site Confirmation: Bernard [name] was asked to confirm the procedure and laterality before marking the site Procedure checklist: Completed Consent: Before the procedure and under the influence of no sedative(s), amnesic(s), or anxiolytics, the patient was informed of the treatment options, risks and possible complications. To fulfill our ethical and legal obligations, as recommended by the American Medical Association's Code of Ethics, I have informed the patient of my clinical impression; the nature and purpose of the treatment or procedure; the risks, benefits, and possible complications of the intervention; the alternatives, including doing nothing; the risk(s) and benefit(s) of the alternative treatment(s) or procedure(s); and the risk(s) and benefit(s) of doing nothing. The patient was provided information about the general risks and possible complications associated with the procedure. These may include, but are not limited to: failure to achieve desired goals, infection, bleeding, organ or nerve damage, allergic reactions, paralysis, and death. In addition, the patient was informed of those risks and complications associated to Spine-related procedures, such as failure to decrease pain; infection (i.e.: Meningitis, epidural or intraspinal abscess); bleeding (i.e.: epidural hematoma, subarachnoid  hemorrhage, or any other type of intraspinal or peri-dural bleeding); organ or nerve damage (i.e.: Any type of peripheral nerve,  nerve root, or spinal cord injury) with subsequent damage to sensory, motor, and/or autonomic systems, resulting in permanent pain, numbness, and/or weakness of one or several areas of the body; allergic reactions; (i.e.: anaphylactic reaction); and/or death. Furthermore, the patient was informed of those risks and complications associated with the medications. These include, but are not limited to: allergic reactions (i.e.: anaphylactic or anaphylactoid reaction(s)); adrenal axis suppression; blood sugar elevation that in diabetics may result in ketoacidosis or comma; water retention that in patients with history of congestive heart failure may result in shortness of breath, pulmonary edema, and decompensation with resultant heart failure; weight gain; swelling or edema; medication-induced neural toxicity; particulate matter embolism and blood vessel occlusion with resultant organ, and/or nervous system infarction; and/or aseptic necrosis of one or more joints. Finally, the patient was informed that Medicine is not an exact science; therefore, there is also the possibility of unforeseen or unpredictable risks and/or possible complications that may result in a catastrophic outcome. The patient indicated having understood very clearly. We have given the patient no guarantees and we have made no promises. Enough time was given to the patient to ask questions, all of which were answered to the patient's satisfaction. Bernard [name] has indicated that he wanted to continue with the procedure. Attestation: I, the ordering provider, attest that I have discussed with the patient the benefits, risks, side-effects, alternatives, likelihood of achieving goals, and potential problems during recovery for the procedure that I have provided informed consent. Date  Time: 05/27/2024 11:09  AM  Pre-Procedure Preparation:  Monitoring: As per clinic protocol. Respiration, ETCO2, SpO2, BP, heart rate and rhythm monitor placed and checked for adequate function Safety Precautions: Patient was assessed for positional comfort and pressure points before starting the procedure. Time-out: I initiated and conducted the Time-out before starting the procedure, as per protocol. The patient was asked to participate by confirming the accuracy of the Time Out information. Verification of the correct person, site, and procedure were performed and confirmed by me, the nursing staff, and the patient. Time-out conducted as per Joint Commission's Universal Protocol (UP.01.01.01). Time: 1154 Start Time: 1154 hrs.  Description of Procedure:            ChloraPrep (2% chlorhexidine gluconate and 70% isopropyl alcohol) Safety Precautions: Aspiration looking for blood return was conducted prior to all injections. At no point did we inject any substances, as a needle was being advanced. No attempts were made at seeking any paresthesias. Safe injection practices and needle disposal techniques used. Medications properly checked for expiration dates. SDV (single dose vial) medications used.  The patient was positioned supine with the head turned slightly to the left to expose the right neck.  The skin over the right lateral neck was prepped with chlorhexidine and draped in a sterile fashion.  A high-frequency linear ultrasound probe was used to identify the anterior scalene muscle, middle scalene muscle, and the C5-C7 nerve roots.  After identifying relevant anatomy, color Doppler was utilized to avoid vascular structures.  A 22-gauge, 50 mm PAJUNK echogenic needle was inserted using an in-plane lateral-to-medial approach under real-time ultrasound guidance.  The needle was advanced to the fascia overlying the anterior scalene muscle, just adjacent to the brachial plexus root level at C6.  After  negative aspiration, 10 mL solution of 9 cc of 0.2% ropivacaine  and 1 cc of Decadron  10 mg/cc was injected incrementally in 2 cc aliquots. Adequate spread between the anterior scalene and middle scalene muscles was observed under ultrasound.  Vitals:   05/27/24 1200 05/27/24 1205 05/27/24 1210 05/27/24 1214  BP: 123/77 127/82 131/80 (!) 129/90  Pulse: 72 63  73  Resp: 17 15 16 16   Temp:      SpO2: 100% 100% 99% 100%  Weight:      Height:        Start Time: 1154 hrs. End Time: 1207 hrs. Materials:  Needle(s) Type: PAJUNK Gauge: 22G Length: 3.5-in   Imaging Guidance :          Type of Imaging Technique: Ultrasound guidance  Antibiotic Prophylaxis:   Anti-infectives (From admission, onward)    None      Indication(s): None identified  Post-operative Assessment:  Post-procedure Vital Signs:  Pulse/HCG Rate: 7362 Temp: 98.2 F (36.8 C) Resp: 16 BP: (!) 129/90 SpO2: 100 %  EBL: None  Immediate Outcome: The patient tolerated the procedure well without complications. No signs of intravascular injection, respiratory compromise, Horner's syndrome, or voice changes were observed. Patient will be monitored for response in symptoms including paresthesias, motor symptoms, or postural aggravation.  Plan:  Monitor response over the next several hours to assess for improvement in symptoms consistent with neurogenic TOS.  If positive response, consider repeat diagnostic or therapeutic blocks or further evaluation with neurology, vascular surgery, or thoracic surgery.  Continue conservative therapy including physical therapy and postural correction.    Complications: No immediate post-treatment complications observed by team, or reported by patient.  Note: The patient tolerated the entire procedure well. A repeat set of vitals were taken after the procedure and the patient was kept under observation following institutional policy, for this type of procedure. Post-procedural  neurological assessment was performed, showing return to baseline, prior to discharge. The patient was provided with post-procedure discharge instructions, including a section on how to identify potential problems. Should any problems arise concerning this procedure, the patient was given instructions to immediately contact us , at any time, without hesitation. In any case, we plan to contact the patient by telephone for a follow-up status report regarding this interventional procedure.  Comments:  No additional relevant information.  Plan of Care (POC)  Orders:  No orders of the defined types were placed in this encounter. Postprocedure pain score 0 out of 10 and patient has no pain with shoulder abduction or straight arm raise.  Plan:  Monitor response over the next several hours to assess for improvement in symptoms consistent with neurogenic TOS.  Patient's pain score 0 out of 10 upon discharge  If positive response, consider repeat diagnostic or therapeutic blocks or further evaluation with vascular surgery.  Continue conservative therapy including physical therapy and postural correction.   Medications ordered for procedure: Meds ordered this encounter  Medications   lidocaine  (XYLOCAINE ) 2 % (with pres) injection 400 mg   ropivacaine  (PF) 2 mg/mL (0.2%) (NAROPIN ) injection 9 mL   dexamethasone  (DECADRON ) injection 10 mg   Medications administered: We administered lidocaine , ropivacaine  (PF) 2 mg/mL (0.2%), and dexamethasone .  See the medical record for exact dosing, route, and time of administration.    Right scalene nerve block with ultrasound guidance 05/27/2024    Follow-up plan:   Return in about 2 weeks (around 06/10/2024) for PPE, VV.     Recent Visits Date Type Provider Dept  05/07/24 Office Visit Marcelino Nurse, MD [facility]  Showing recent visits within past 90 days and meeting all other requirements Today's Visits Date Type Provider Dept  05/27/24  Procedure visit Marcelino Nurse, MD [facility]  Showing today's visits and meeting all other requirements Future Appointments Date Type Provider Dept  06/10/24 Appointment Marcelino Nurse, MD [facility]  Showing future appointments within next 90 days and meeting all other requirements   Disposition: Discharge home  Discharge (Date  Time): 05/27/2024; 1215 hrs.   Primary Care Physician: Joshua Debby CROME, MD Location: [facility] Note by: Nurse Marcelino, MD (TTS technology used. I apologize for any typographical errors that were not detected and corrected.) Date: 05/27/2024; Time: 1:44 PM  Disclaimer:  Medicine is not an Visual merchandiser. The only guarantee in medicine is that nothing is guaranteed. It is important to note that the decision to proceed with this intervention was based on the information collected from the patient. The Data and conclusions were drawn from the patient's questionnaire, the interview, and the physical examination. Because the information was provided in large part by the patient, it cannot be guaranteed that it has not been purposely or unconsciously manipulated. Every effort has been made to obtain as much relevant data as possible for this evaluation. It is important to note that the conclusions that lead to this procedure are derived in large part from the available data. Always take into account that the treatment will also be dependent on availability of resources and existing treatment guidelines, considered by other Pain Management Practitioners as being common knowledge and practice, at the time of the intervention. For Medico-Legal purposes, it is also important to point out that variation in procedural techniques and pharmacological choices are the acceptable norm. The indications, contraindications, technique, and results of the above procedure should only be interpreted and judged by a Board-Certified Interventional Pain  Specialist with extensive familiarity and expertise in the same exact procedure and technique.

## 2024-05-27 NOTE — Progress Notes (Signed)
 Safety precautions to be maintained throughout the outpatient stay will include: orient to surroundings, keep bed in low position, maintain call bell within reach at all times, provide assistance with transfer out of bed and ambulation.

## 2024-05-27 NOTE — Patient Instructions (Signed)

## 2024-05-28 ENCOUNTER — Telehealth: Payer: Self-pay | Admitting: *Deleted

## 2024-05-28 NOTE — Telephone Encounter (Signed)
Post procedure call:   no  questions or concerns.  

## 2024-05-30 ENCOUNTER — Ambulatory Visit: Admitting: Sports Medicine

## 2024-05-30 VITALS — BP 128/85 | Ht 69.0 in | Wt 150.0 lb

## 2024-05-30 DIAGNOSIS — G540 Brachial plexus disorders: Secondary | ICD-10-CM | POA: Diagnosis not present

## 2024-05-30 NOTE — Progress Notes (Signed)
 PCP: Joshua Debby CROME, MD  Subjective:   HPI: Patient is a 38 y.o. male here for follow-up evaluation of right shoulder pain, he was last seen in clinic on 12/21/2023.  Patient reports that he continues to have pain and limited range of motion of his right shoulder. He was seen by Dr. Sheree with vascular surgery for evaluation of neurogenic thoracic outlet syndrome. He was subsequently referred to and seen by Dr. Marcelino with pain management and received a scalene injection of Bupivacaine and Decadron  on 05/27/2024. Patient reports that he experienced almost complete resolution of symptoms for almost 48 hours after the injection. He states that he was able to hang laundry overhead, perform pull-ups, and even toss his son up and down at the pool. However, by the morning of 05/29/2024 he reports that his symptoms had returned and that he was back to his baseline in terms of pain and limited range of motion.   Past Medical History:  Diagnosis Date   Fundic gland polyposis of stomach    GERD (gastroesophageal reflux disease)    Hemorrhoid    Sebaceous cyst     Current Outpatient Medications on File Prior to Visit  Medication Sig Dispense Refill   cetirizine (ZYRTEC) 10 MG tablet Take 10 mg by mouth every morning.      dexlansoprazole  (DEXILANT ) 60 MG capsule Take 1 capsule (60 mg total) by mouth daily. 100 capsule 3   diclofenac  (CATAFLAM ) 50 MG tablet Take 1 to 2 tablets by mouth at onset of headache as needed. **Maximum dose of 2 tablets in 24 hours** (Patient not taking: Reported on 05/27/2024) 15 tablet 1   famotidine  (PEPCID ) 20 MG tablet Take 1 tablet by mouth twice daily. 180 tablet 2   pregabalin  (LYRICA ) 50 MG capsule Take 1 capsule (50 mg total) by mouth 3 (three) times daily. 270 capsule 0   psyllium (METAMUCIL) 58.6 % packet Take 1 packet by mouth daily.     topiramate  (TOPAMAX ) 100 MG tablet Take 1 tablet (100 mg total) by mouth at bedtime. 90 tablet 1   TURMERIC PO Take by mouth. (Patient  not taking: Reported on 05/27/2024)     No current facility-administered medications on file prior to visit.    Past Surgical History:  Procedure Laterality Date   ESOPHAGEAL MANOMETRY N/A 10/09/2019   Procedure: ESOPHAGEAL MANOMETRY (EM);  Surgeon: Avram Lupita BRAVO, MD;  Location: [facility];  Service: Endoscopy;  Laterality: N/A;   ESOPHAGOGASTRODUODENOSCOPY N/A 05/18/2015   Procedure: ESOPHAGOGASTRODUODENOSCOPY (EGD);  Surgeon: Lamar JONETTA Aho, MD;  Location: [facility];  Service: Endoscopy;  Laterality: N/A;   HEMORRHOID BANDING     PH IMPEDANCE STUDY  10/09/2019   Procedure: PH IMPEDANCE STUDY;  Surgeon: Avram Lupita BRAVO, MD;  Location: [facility];  Service: Endoscopy;;   WISDOM TOOTH EXTRACTION      Allergies  Allergen Reactions   Mold Extract [Trichophyton]     Sneezing, runny nose.     BP 128/85   Ht 5' 9 (1.753 m)   Wt 150 lb (68 kg)   BMI 22.15 kg/m       No data to display              No data to display              Objective:  Physical Exam:  Gen: NAD, comfortable in exam room  MSK: Right shoulder Inspection: No appreciable soft tissue or bony abnormalities. Appears symmetrical in comparison with left shoulder.  Palpation: There is no tenderness to palpation along the clavicle, AC joint, bicipital groove, or humeral head.  ROM: Limited to 80 degrees with forward flexion and abduction with purposeful retraction of the scalene muscles     Assessment & Plan:  1. Chronic right shoulder pain - At this time, patient's chronic right shoulder pain and limited mobility appear to be secondary to neurogenic thoracic outlet syndrome. As supported by his positive response to the scalene injection for roughly 48 hours, followed by return of symptoms. Patient has follow-up appointments with both Dr. Sheree and Dr. Marcelino to discuss these results, as well as potential next steps (potentially rib resection). Discussed with patient different potential etiologies  of his neurogenic thoracic outlet syndrome such as poor posture, a cervical rib, an accessory scalene muscle, or a fibrous band. Patient has good posture, is without a cervical rib, and suspect than MRI would have revealed an accessory scalene muscle, therefore wonder if there may be a fibrous band compression the brachial plexus that has not been appreciable on imaging. In the meantime, encouraged patient to continue focusing on maintaining good posture and to limit external rotation of the right shoulder. Encouraged patient to follow-up pending continued evaluation with vascular surgery and pain management.   - Continue focusing on good posture  - Limit external rotation of right shoulder  - Follow-up as needed

## 2024-05-30 NOTE — Assessment & Plan Note (Signed)
 Please see visit note  We are hopeful that with + injection test that he has a surgical option

## 2024-06-10 ENCOUNTER — Ambulatory Visit
Attending: Student in an Organized Health Care Education/Training Program | Admitting: Student in an Organized Health Care Education/Training Program

## 2024-06-10 DIAGNOSIS — M79601 Pain in right arm: Secondary | ICD-10-CM | POA: Diagnosis not present

## 2024-06-10 DIAGNOSIS — G540 Brachial plexus disorders: Secondary | ICD-10-CM

## 2024-06-10 DIAGNOSIS — G894 Chronic pain syndrome: Secondary | ICD-10-CM

## 2024-06-10 NOTE — Progress Notes (Signed)
 Patient and this is Dr. Lazarus PROVIDER NOTE: Interpretation of information contained herein should be left to medically-trained personnel. Specific patient instructions are provided elsewhere under Patient Instructions section of medical record. This document was created in part using AI and STT-dictation technology, any transcriptional errors that may result from this process are unintentional.  Patient: Bernard [name]  Service: E/M   PCP: Joshua Debby CROME, MD  DOB: [birth year]  DOS: 06/10/2024  Provider: Wallie Sherry, MD  MRN: [id number]  Delivery: Virtual Visit  Specialty: Interventional Pain Management  Type: Established Patient  Setting: Ambulatory outpatient facility  Specialty designation: 09  Referring Prov.: Joshua Debby CROME, MD  Location: Remote location       Virtual Encounter - Pain Management PROVIDER NOTE: Information contained herein reflects review and annotations entered in association with encounter. Interpretation of such information and data should be left to medically-trained personnel. Information provided to patient can be located elsewhere in the medical record under Patient Instructions. Document created using STT-dictation technology, any transcriptional errors that may result from process are unintentional.    Contact & Pharmacy Preferred: [phone] Home: [phone] (home) Mobile: [phone] (mobile) E-mail: [email or web address]  [email or web address] - [facility] - [place], [place] - [address] [place] [address] Phone: [phone] Fax: [phone]  [id number] [place], [place] - [address] [place] [address] Phone: [phone] Fax: [phone]   Pre-screening  Mr. Leeds offered in-person vs virtual encounter. He indicated preferring virtual for this encounter.   Reason COVID-19*  Social distancing based on CDC and AMA recommendations.   I contacted BOWYN MERCIER on 06/10/2024 via telephone.      I clearly identified myself as Wallie Sherry, MD. I verified that I was speaking with the correct person using two identifiers (Name: NAYEF COLLEGE, and date of birth: [birth year]).  Consent I sought verbal advanced consent from Bernard [name] for virtual visit interactions. I informed Mr. Wenke of possible security and privacy concerns, risks, and limitations associated with providing not-in-person medical evaluation and management services. I also informed Mr. Hinchliffe of the availability of in-person appointments. Finally, I informed him that there would be a charge for the virtual visit and that he could be  personally, fully or partially, financially responsible for it. Mr. Tetrick expressed understanding and agreed to proceed.   Historic Elements   Mr. CASMER YEPIZ is a 38 y.o. year old, male patient evaluated today after our last contact on 05/27/2024. Mr. Bibby  has a past medical history of Fundic gland polyposis of stomach, GERD (gastroesophageal reflux disease), Hemorrhoid, and Sebaceous cyst. He also  has a past surgical history that includes Esophagogastroduodenoscopy (N/A, 05/18/2015); Wisdom tooth extraction; Esophageal manometry (N/A, 10/09/2019); PH impedance study (10/09/2019); and Hemorrhoid banding. Mr. Maybury has a current medication list which includes the following prescription(s): cetirizine, dexlansoprazole , diclofenac , famotidine , pregabalin , psyllium, topiramate , and turmeric. He  reports that he quit smoking about 11 years ago. His smoking use included e-cigarettes and cigarettes. He started smoking about 21 years ago. He has a 10 pack-year smoking history. He has never been exposed to tobacco smoke. He has never used smokeless tobacco. He reports current alcohol use. He reports that he does not use drugs. Mr. Smoak is allergic to mold extract [trichophyton].  BMI: Estimated body mass index is 22.15 kg/m as calculated from the  following:   Height as of 05/30/24: 5' 9 (1.753  m).   Weight as of 05/30/24: 150 lb (68 kg). Last encounter: 05/07/2024. Last procedure: 05/27/2024.  HPI  Today, he is being contacted for  Post-Procedure Evaluation    Procedure:          Anesthesia, Analgesia, Anxiolysis:  Type: Right scalene nerve block for potential neurogenic thoracic outlet syndrome  Anesthesia: Local (1-2% Lidocaine )  Anxiolysis: None  Sedation: None  Guidance: Ultrasound           Position: Supine   1. 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 Right arm pain   3. Chronic pain syndrome    NAS-11 Pain score:   Pre-procedure: 3 /10   Post-procedure: 0-No pain/10     Effectiveness:  Initial hour after procedure: 100 %  Subsequent 4-6 hours post-procedure: 100 % (first day)  Analgesia past initial 6 hours: 95 % (once he returned home, approx 1 hour post procedure he could feel soreness at injection site. reports he did some activity to see how the nerve block was working. just soreness at the site.  practiced ROM etc.  approx 48 hours post procedure s/s returned)  Ongoing improvement:  Analgesic: Back to baseline after 48 hours Function: Back to baseline ROM: Back to baseline   Laboratory Chemistry Profile   Renal Lab Results  Component Value Date   BUN 19 08/02/2022   CREATININE 1.06 08/02/2022   GFR 89.88 08/02/2022   GFRAA >60 05/01/2015   GFRNONAA >60 05/01/2015    Hepatic Lab Results  Component Value Date   AST 17 07/14/2021   ALT 21 07/14/2021   ALBUMIN 4.3 07/14/2021   ALKPHOS 47 07/14/2021   HCVAB <0.1 06/17/2017    Electrolytes Lab Results  Component Value Date   NA 137 08/02/2022   K 4.2 08/02/2022   CL 104 08/02/2022   CALCIUM 9.7 08/02/2022    Bone Lab Results  Component Value Date   VD25OH 41.26 07/14/2021   TESTOSTERONE  320.62 08/13/2019    Inflammation (CRP: Acute Phase) (ESR: Chronic Phase) Lab Results  Component Value Date   CRP <1.0  08/13/2019   ESRSEDRATE 2 07/14/2021         Note: Above Lab results reviewed.  Imaging  VAS UE DOPPLER BILAT/COMP TOS, DIGITS (TO&UE REYNAUDS) UPPER EXTREMITY DOPPLER STUDY  Patient Name:  JAZZ ROGALA  Date of Exam:   03/20/2024 Medical Rec #: [id number]         Accession #:    [id number] Date of Birth: 27-Jan-1986         Patient Gender: M Patient Age:   89 years Exam Location:  [address] Procedure:      VAS US  TOS EXAM Referring Phys:  --------------------------------------------------------------------------------   Indications: Right sided thoracic pain with certain movements. History:     Patient complains of right sided thoracic pain with certian              movements for about 18 months. He denies trauma. He indicated he              might have Raynaud's Syndrome in both feet.  Risk Factors: Past history of smoking.  Limitations: Unable to bring left arm up during onset part of testing due to              discomfort.  Comparison Study: None  Performing Technologist: Horton Fairy RVT    Examination Guidelines: A complete evaluation includes B-mode imaging, spectral Doppler, color Doppler, and power Doppler as  needed of all accessible portions of each vessel. Bilateral testing is considered an integral part of a complete examination. Limited examinations for reoccurring indications may be performed as noted.    Right Doppler Findings: +--------+--------+-----+---------+--------+ Site    PressureIndexDoppler  Comments +--------+--------+-----+---------+--------+ [id number]          triphasic         +--------+--------+-----+---------+--------+ Radial  121     0.87 triphasic         +--------+--------+-----+---------+--------+ Ulnar   143     1.03 triphasic         +--------+--------+-----+---------+--------+     Left Doppler Findings: +--------+--------+-----+---------+--------+ Site    PressureIndexDoppler   Comments +--------+--------+-----+---------+--------+ [id number]          triphasic         +--------+--------+-----+---------+--------+ Radial  144     1.04 triphasic         +--------+--------+-----+---------+--------+ Ulnar   168     1.21 triphasic         +--------+--------+-----+---------+--------+     TOS Maneuvers: +--------------------+---------------+--------------------+ Right 1st Digit FlowManuever       Left 1st Digit Flow  +--------------------+---------------+--------------------+ No change           Adson's-Right  No change            +--------------------+---------------+--------------------+ No change           Adson's-Left   No change            +--------------------+---------------+--------------------+ No change           Soldier's      No change            +--------------------+---------------+--------------------+ Obliteration of flowAbduction      Obliteration of flow +--------------------+---------------+--------------------+ Diminished flow     Hyper-AbductionDiminished flow      +--------------------+---------------+--------------------+    Summary:   Right: See table above for positional maneuver changes. Left: See table above for positional maneuver changes. *See table(s) above for measurements and observations.  Electronically signed by Penne Colorado MD on 03/20/2024 at 5:12:34 PM.      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 Diagnoses of Right arm pain and Chronic pain syndrome were also pertinent to this visit. Patient is status post right scalene block, performed as a diagnostic tool for suspected neurogenic thoracic outlet syndrome. He experienced complete resolution of symptoms and 100% pain relief for 48 hours following the block. His pain has since returned to baseline. This response strongly supports neurogenic thoracic  outlet syndrome as the etiology of his symptoms. Plan of Care   The diagnostic block was positive, confirming that his symptoms are likely due to neurogenic thoracic outlet syndrome. He has an upcoming consultation with Dr. Colorado (vascular surgery) to discuss definitive management, which may include first rib resection for decompression. Our role at this point is supportive; no further interventional procedures are recommended.  Encourage continued conservative measures (activity modification, stretching, physical therapy if tolerated) until surgical consultation.  Follow-up plan:   Return for PRN.      Right scalene nerve block with ultrasound guidance 05/27/2024 100% pain relief for 48 hours    Recent Visits Date Type Provider Dept  05/27/24 Procedure visit Marcelino Nurse, MD [facility]  05/07/24 Office Visit Marcelino Nurse, MD [facility]  Showing recent visits within past 90 days and meeting all other requirements Today's Visits Date Type Provider Dept  06/10/24 Office Visit  Marcelino Nurse, MD [facility]  Showing today's visits and meeting all other requirements Future Appointments No visits were found meeting these conditions. Showing future appointments within next 90 days and meeting all other requirements  I discussed the assessment and treatment plan with the patient. The patient was provided an opportunity to ask questions and all were answered. The patient agreed with the plan and demonstrated an understanding of the instructions.  Patient advised to call back or seek an in-person evaluation if the symptoms or condition worsens.  Duration of encounter: .  Note by: Nurse Marcelino, MD Date: 06/10/2024; Time: 3:11 PM

## 2024-06-19 ENCOUNTER — Other Ambulatory Visit: Payer: Self-pay

## 2024-06-19 ENCOUNTER — Encounter: Payer: Self-pay | Admitting: Vascular Surgery

## 2024-06-19 ENCOUNTER — Ambulatory Visit: Attending: Vascular Surgery | Admitting: Vascular Surgery

## 2024-06-19 VITALS — BP 130/85 | HR 68 | Temp 98.3°F | Ht 69.0 in | Wt 152.0 lb

## 2024-06-19 DIAGNOSIS — G540 Brachial plexus disorders: Secondary | ICD-10-CM

## 2024-06-19 NOTE — Progress Notes (Signed)
 Patient ID: Bernard [name], male   DOB: 10-12-1986, 38 y.o.   MRN: [id number]  Reason for Consult: Follow-up   Referred by Joshua Debby CROME, MD  Subjective:     HPI:  Bernard [name] is a 38 y.o. male with history concerning for right neurogenic thoracic outlet syndrome.  Patient has undergone multiple previous evaluations including MRI and CT angio.  He has completed physical therapy and been evaluated by sports medicine as well as orthopedic surgery.  More recently has undergone right anterior scalene muscle block.  He states that after the muscle block all of his symptoms resolved for approximately 2 days.  Specifically his symptoms of severe pain with abduction of his right upper extremity was completely resolved.  He has now returned to having limitations due to very limited lifting of weights and cannot raise his hand over his head or even touch his hair without severe pain.  This had all resolved after scalene muscle block.  He is here today to discuss possible surgical intervention.  Past Medical History:  Diagnosis Date   Fundic gland polyposis of stomach    GERD (gastroesophageal reflux disease)    Hemorrhoid    Sebaceous cyst    Family History  Problem Relation Age of Onset   Allergic rhinitis Mother    Heart disease Father        valve replacement   Hypertension Father    GER disease Father    Dementia Father        early onset   Allergic rhinitis Sister    Breast cancer Maternal Grandmother    Colon cancer Neg Hx    Past Surgical History:  Procedure Laterality Date   ESOPHAGEAL MANOMETRY N/A 10/09/2019   Procedure: ESOPHAGEAL MANOMETRY (EM);  Surgeon: Avram Lupita BRAVO, MD;  Location: [facility];  Service: Endoscopy;  Laterality: N/A;   ESOPHAGOGASTRODUODENOSCOPY N/A 05/18/2015   Procedure: ESOPHAGOGASTRODUODENOSCOPY (EGD);  Surgeon: Lamar JONETTA Aho, MD;  Location: [facility];  Service: Endoscopy;  Laterality: N/A;   HEMORRHOID BANDING     PH IMPEDANCE STUDY   10/09/2019   Procedure: PH IMPEDANCE STUDY;  Surgeon: Avram Lupita BRAVO, MD;  Location: [facility];  Service: Endoscopy;;   WISDOM TOOTH EXTRACTION      Short Social History:  Social History   Tobacco Use   Smoking status: Former    Current packs/day: 0.00    Average packs/day: 1 pack/day for 10.0 years (10.0 ttl pk-yrs)    Types: E-cigarettes, Cigarettes    Start date: 11/24/2002    Quit date: 11/24/2012    Years since quitting: 11.5    Passive exposure: Never   Smokeless tobacco: Never   Tobacco comments:    quit e cigarette 2016  Substance Use Topics   Alcohol use: Yes    Alcohol/week: 0.0 standard drinks of alcohol    Comment: occasional    Allergies  Allergen Reactions   Mold Extract [Trichophyton]     Sneezing, runny nose.     Current Outpatient Medications  Medication Sig Dispense Refill   cetirizine (ZYRTEC) 10 MG tablet Take 10 mg by mouth every morning.      dexlansoprazole  (DEXILANT ) 60 MG capsule Take 1 capsule (60 mg total) by mouth daily. 100 capsule 3   diclofenac  (CATAFLAM ) 50 MG tablet Take 1 to 2 tablets by mouth at onset of headache as needed. **Maximum dose of 2 tablets in 24 hours** 15 tablet 1   famotidine  (PEPCID ) 20 MG tablet Take 1  tablet by mouth twice daily. 180 tablet 2   pregabalin  (LYRICA ) 50 MG capsule Take 1 capsule (50 mg total) by mouth 3 (three) times daily. 270 capsule 0   psyllium (METAMUCIL) 58.6 % packet Take 1 packet by mouth daily.     topiramate  (TOPAMAX ) 100 MG tablet Take 1 tablet (100 mg total) by mouth at bedtime. 90 tablet 1   TURMERIC PO Take by mouth.     No current facility-administered medications for this visit.    Review of Systems  Constitutional:  Constitutional negative. HENT: HENT negative.  Eyes: Eyes negative.  Respiratory: Respiratory negative.  Cardiovascular: Cardiovascular negative.  GI: Gastrointestinal negative.  Musculoskeletal:       Right upper extremity severe pain limiting mobility Neurological:  Neurological negative. Hematologic: Hematologic/lymphatic negative.  Psychiatric: Psychiatric negative.        Objective:  Objective   Vitals:   06/19/24 0933  BP: 130/85  Pulse: 68  Temp: 98.3 F (36.8 C)  SpO2: 98%  Weight: 152 lb (68.9 kg)  Height: 5' 9 (1.753 m)   Body mass index is 22.45 kg/m.  Physical Exam HENT:     Head: Normocephalic.     Nose: Nose normal.  Eyes:     Pupils: Pupils are equal, round, and reactive to light.  Cardiovascular:     Rate and Rhythm: Normal rate.     Pulses: Normal pulses.  Pulmonary:     Effort: Pulmonary effort is normal.  Abdominal:     General: Abdomen is flat.  Musculoskeletal:     Comments: He has minimal tenderness to palpation in the right supraclavicular area Patient has limited ability  Neurological:     General: No focal deficit present.     Mental Status: He is alert.  Psychiatric:        Mood and Affect: Mood normal.     Data: Previous scalene block results reviewed     Assessment/Plan:     38 year old male with concern for right sided neurogenic thoracic outlet syndrome.  His symptoms are somewhat atypical although he has undergone rigorous evaluation that has not elicited any other underlying cause.  He did undergo right scalene block with 100% resolution of symptoms for approximately 48 hours which would suggest an underlying neurogenic thoracic outlet component.  I have discussed with him the risk and benefits of transaxillary right first rib resection including the risk of injury to the surrounding structures which would include the brachial plexus, subclavian or axillary artery and the subclavian or axillary vein.  We discussed the possible causes of underlying pneumothorax up to requiring chest tube and up to 2 days of hospitalization.  We discussed that he may not have complete or any resolution of symptoms given that he has relatively atypical symptoms.  We discussed that he will need ongoing physical  therapy 2 weeks after recovering from initial surgical procedure.  He demonstrates very good understanding without another cause for his underlying symptoms he wishes to proceed.  We did discuss that I performed this from a transaxillary approach particularly for neurogenic thoracic outlet and he demonstrates good understanding.  I would not perform pectoralis minor release at the time of the index surgery given that most of his symptoms appear to be cephalad to the pectoralis minor and we have not elicited any symptoms from the pec Minor itself.  All of his questions were answered in depth and he demonstrated her standing.     Penne Lonni Colorado MD [facility]

## 2024-06-26 ENCOUNTER — Encounter: Payer: Self-pay | Admitting: Vascular Surgery

## 2024-07-02 ENCOUNTER — Ambulatory Visit: Admitting: Student in an Organized Health Care Education/Training Program

## 2024-07-19 ENCOUNTER — Other Ambulatory Visit: Payer: Self-pay | Admitting: Sports Medicine

## 2024-07-23 ENCOUNTER — Other Ambulatory Visit: Payer: Self-pay | Admitting: Sports Medicine

## 2024-07-23 MED ORDER — PREGABALIN 50 MG PO CAPS: 50.0000 mg | ORAL_CAPSULE | Freq: Three times a day (TID) | ORAL | 0 refills | Status: DC

## 2024-07-26 NOTE — Progress Notes (Signed)
 Surgical Instructions   Your procedure is scheduled on Friday, October 10th, 2025. Report to [facility] Main Entrance A at 5:30 A.M., then check in with the Admitting office. Any questions or running late day of surgery: call [phone]  Questions prior to your surgery date: call [phone], Monday-Friday, 8am-4pm. If you experience any cold or flu symptoms such as cough, fever, chills, shortness of breath, etc. between now and your scheduled surgery, please notify us  at the above number.     Remember:  Do not eat or drink after midnight the night before your surgery    Take these medicines the morning of surgery with A SIP OF WATER: Cetirizine (Zyrtec) Dexlansoprazole  (Dexilant ) Famotidine  (Pepcid ) Pregabalin  (Lyrica )   May take these medicines IF NEEDED: None.    One week prior to surgery, STOP taking any Aspirin (unless otherwise instructed by your surgeon) Aleve, Naproxen, Ibuprofen, Motrin, Advil, Goody's, BC's, all herbal medications, fish oil, and non-prescription vitamins.                     Do NOT Smoke (Tobacco/Vaping) for 24 hours prior to your procedure.  If you use a CPAP at night, you may bring your mask/headgear for your overnight stay.   You will be asked to remove any contacts, glasses, piercing's, hearing aid's, dentures/partials prior to surgery. Please bring cases for these items if needed.    Patients discharged the day of surgery will not be allowed to drive home, and someone needs to stay with them for 24 hours.  SURGICAL WAITING ROOM VISITATION Patients may have no more than 2 support people in the waiting area - these visitors may rotate.   Pre-op nurse will coordinate an appropriate time for 1 ADULT support person, who may not rotate, to accompany patient in pre-op.  Children under the age of 49 must have an adult with them who is not the patient and must remain in the main waiting area with an adult.  If the patient needs to stay at the  hospital during part of their recovery, the visitor guidelines for inpatient rooms apply.  Please refer to the [facility] website for the visitor guidelines for any additional information.   If you received a COVID test during your pre-op visit  it is requested that you wear a mask when out in public, stay away from anyone that may not be feeling well and notify your surgeon if you develop symptoms. If you have been in contact with anyone that has tested positive in the last 10 days please notify you surgeon.      Pre-operative CHG Bathing Instructions   You can play a key role in reducing the risk of infection after surgery. Your skin needs to be as free of germs as possible. You can reduce the number of germs on your skin by washing with CHG (chlorhexidine gluconate) soap before surgery. CHG is an antiseptic soap that kills germs and continues to kill germs even after washing.   DO NOT use if you have an allergy to chlorhexidine/CHG or antibacterial soaps. If your skin becomes reddened or irritated, stop using the CHG and notify one of our RNs at [phone].              TAKE A SHOWER THE NIGHT BEFORE SURGERY   Please keep in mind the following:  DO NOT shave, including legs and underarms, 48 hours prior to surgery.   You may shave your face before/day of surgery.  Place  clean sheets on your bed the night before surgery Use a clean washcloth (not used since being washed) for each shower. DO NOT sleep with pet's night before surgery.  CHG Shower Instructions:  Wash your face and private area with normal soap. If you choose to wash your hair, wash first with your normal shampoo.  After you use shampoo/soap, rinse your hair and body thoroughly to remove shampoo/soap residue.  Turn the water OFF and apply half the bottle of CHG soap to a CLEAN washcloth.  Apply CHG soap ONLY FROM YOUR NECK DOWN TO YOUR TOES (washing for 3-5 minutes)  DO NOT use CHG soap on face, private areas, open  wounds, or sores.  Pay special attention to the area where your surgery is being performed.  If you are having back surgery, having someone wash your back for you may be helpful. Wait 2 minutes after CHG soap is applied, then you may rinse off the CHG soap.  Pat dry with a clean towel  Put on clean pajamas    Additional instructions for the day of surgery: If you choose, you may shower the morning of surgery with an antibacterial soap.  DO NOT APPLY any lotions, deodorants, cologne, or perfumes.   Do not wear jewelry or makeup Do not wear nail polish, gel polish, artificial nails, or any other type of covering on natural nails (fingers and toes) Do not bring valuables to the hospital. [facility] is not responsible for valuables/personal belongings. Put on clean/comfortable clothes.  Please brush your teeth.  Ask your nurse before applying any prescription medications to the skin.

## 2024-07-29 ENCOUNTER — Other Ambulatory Visit: Payer: Self-pay

## 2024-07-29 ENCOUNTER — Encounter (HOSPITAL_COMMUNITY): Payer: Self-pay

## 2024-07-29 ENCOUNTER — Encounter (HOSPITAL_COMMUNITY)
Admission: RE | Admit: 2024-07-29 | Discharge: 2024-07-29 | Disposition: A | Discharge status: Home / Self Care | Source: Ambulatory Visit | Attending: Vascular Surgery | Admitting: Vascular Surgery

## 2024-07-29 VITALS — BP 126/88 | HR 89 | Temp 98.4°F | Resp 16 | Ht 69.0 in | Wt 154.0 lb

## 2024-07-29 DIAGNOSIS — Z87891 Personal history of nicotine dependence: Secondary | ICD-10-CM | POA: Diagnosis not present

## 2024-07-29 DIAGNOSIS — Z79899 Other long term (current) drug therapy: Secondary | ICD-10-CM | POA: Diagnosis not present

## 2024-07-29 DIAGNOSIS — G540 Brachial plexus disorders: Secondary | ICD-10-CM | POA: Diagnosis not present

## 2024-07-29 DIAGNOSIS — Z01818 Encounter for other preprocedural examination: Secondary | ICD-10-CM | POA: Diagnosis present

## 2024-07-29 DIAGNOSIS — Z01812 Encounter for preprocedural laboratory examination: Secondary | ICD-10-CM | POA: Insufficient documentation

## 2024-07-29 DIAGNOSIS — K219 Gastro-esophageal reflux disease without esophagitis: Secondary | ICD-10-CM | POA: Insufficient documentation

## 2024-07-29 LAB — CBC
HCT: 47.8 % (ref 39.0–52.0)
Hemoglobin: 16.3 g/dL (ref 13.0–17.0)
MCH: 29.9 pg (ref 26.0–34.0)
MCHC: 34.1 g/dL (ref 30.0–36.0)
MCV: 87.7 fL (ref 80.0–100.0)
Platelets: 256 K/uL (ref 150–400)
RBC: 5.45 MIL/uL (ref 4.22–5.81)
RDW: 12 % (ref 11.5–15.5)
WBC: 8.1 K/uL (ref 4.0–10.5)
nRBC: 0 % (ref 0.0–0.2)

## 2024-07-29 LAB — COMPREHENSIVE METABOLIC PANEL WITH GFR
ALT: 48 U/L — ABNORMAL HIGH (ref 0–44)
AST: 30 U/L (ref 15–41)
Albumin: 4.2 g/dL (ref 3.5–5.0)
Alkaline Phosphatase: 45 U/L (ref 38–126)
Anion gap: 8 (ref 5–15)
BUN: 13 mg/dL (ref 6–20)
CO2: 24 mmol/L (ref 22–32)
Calcium: 9.2 mg/dL (ref 8.9–10.3)
Chloride: 105 mmol/L (ref 98–111)
Creatinine, Ser: 1.18 mg/dL (ref 0.61–1.24)
GFR, Estimated: >60 mL/min (ref >60)
Glucose, Bld: 100 mg/dL — ABNORMAL HIGH (ref 70–99)
Potassium: 3.8 mmol/L (ref 3.5–5.1)
Sodium: 137 mmol/L (ref 135–145)
Total Bilirubin: 0.7 mg/dL (ref 0.0–1.2)
Total Protein: 6.6 g/dL (ref 6.5–8.1)

## 2024-07-29 LAB — URINALYSIS, ROUTINE W REFLEX MICROSCOPIC
Bilirubin Urine: NEGATIVE
Glucose, UA: NEGATIVE mg/dL
Hgb urine dipstick: NEGATIVE
Ketones, ur: NEGATIVE mg/dL
Leukocytes,Ua: NEGATIVE
Nitrite: NEGATIVE
Protein, ur: NEGATIVE mg/dL
Specific Gravity, Urine: 1.006 (ref 1.005–1.030)
pH: 7 (ref 5.0–8.0)

## 2024-07-29 LAB — TYPE AND SCREEN
ABO/RH(D): A POS
Antibody Screen: NEGATIVE

## 2024-07-29 LAB — APTT: aPTT: 30 s (ref 24–36)

## 2024-07-29 LAB — SURGICAL PCR SCREEN
MRSA, PCR: NEGATIVE
Staphylococcus aureus: NEGATIVE

## 2024-07-29 NOTE — Progress Notes (Signed)
 PCP - Dr. Beverley Amor Cardiologist - Denies  PPM/ICD - Denies Device Orders - N/A Rep Notified - N/A  Chest x-ray - N/A EKG - N/A Stress Test - Denies ECHO - Denies Cardiac Cath - Denies  Sleep Study - Denies CPAP - N/A  NON-diabetic  Last dose of GLP1 agonist-  Denies GLP1 instructions: N/A  Blood Thinner Instructions:Denies Aspirin Instructions:Denies  ERAS Protcol - NPO PRE-SURGERY Ensure or G2- none  COVID TEST- N/A   Anesthesia review: No  Patient denies shortness of breath, fever, cough and chest pain at PAT appointment. Patient developed a cold a week ago and started taking mucinex and Loratadine D. Symptoms included a cough with a runny nose. [place] came to see the patient and will follow up later in week.  Patient instructed to mucinex and loratadine and Lynwood will follow up later in week.    All instructions explained to the patient, with a verbal understanding of the material. Patient agrees to go over the instructions while at home for a better understanding. Patient also instructed to self quarantine after being tested for COVID-19. The opportunity to ask questions was provided.

## 2024-08-01 NOTE — Progress Notes (Signed)
 Anesthesia Chart Review:  38 year old male former smoker (10 pack years, quit 2014) with pertinent history including GERD on H2 blocker, neurogenic right-sided thoracic outlet syndrome.  I spoke with patient and his preadmission testing appointment on 07/29/2024 due to report of recent mild cold.  He states he developed symptoms of sinus congestion and mild cough about a week ago.  He is treating his conservatively with OTC medications including Mucinex and loratadine D.  Overall he feels significantly improved, symptoms at this point are minimal and include sinus congestion and scant minimally productive cough.  He denies any fever or shortness of breath.  Symptoms are not preventing him from working or performing any of his daily activities.  On exam he is well-appearing, he does not cough throughout my interview, lungs CTAB, heart regular rate and rhythm.  We discussed if continues on this trajectory of improvement I anticipate he will get a proceed with surgery as planned on 10/10.  Patient is very anxious to get this done and states he has been waiting almost 2 years for diagnosis and solution for this issue.  I did advise him to discontinue any over-the-counter decongestion medications; okay to continue Mucinex.  I also advised I will call him on 08/01/2024 to follow-up.  I spoke with patient via phone on 08/01/2024 and he confirmed that he is continued to feel well, discontinued all OTC medications on 07/30/2024, and has essentially no symptoms at this time other than very mild sinus congestion.    I anticipate he can proceed as planned barring acute status change.  Preop labs reviewed, unremarkable.     Lynwood Geofm RIGGERS [facility]/Anesthesiology Phone [phone] 08/01/2024 3:48 PM

## 2024-08-01 NOTE — Anesthesia Preprocedure Evaluation (Signed)
 Anesthesia Evaluation    Airway        Dental   Pulmonary former smoker          Cardiovascular      Neuro/Psych    GI/Hepatic   Endo/Other    Renal/GU      Musculoskeletal   Abdominal   Peds  Hematology   Anesthesia Other Findings   Reproductive/Obstetrics                              Anesthesia Physical Anesthesia Plan  ASA:   Anesthesia Plan:    Post-op Pain Management:    Induction:   PONV Risk Score and Plan:   Airway Management Planned:   Additional Equipment:   Intra-op Plan:   Post-operative Plan:   Informed Consent:   Plan Discussed with:   Anesthesia Plan Comments: (PAT note by Lynwood Hope, PA-C: 38 year old male former smoker (10 pack years, quit 2014) with pertinent history including GERD on H2 blocker, neurogenic right-sided thoracic outlet syndrome.  I spoke with patient and his preadmission testing appointment on 07/29/2024 due to report of recent mild cold.  He states he developed symptoms of sinus congestion and mild cough about a week ago.  He is treating his conservatively with OTC medications including Mucinex and loratadine D.  Overall he feels significantly improved, symptoms at this point are minimal and include sinus congestion and scant minimally productive cough.  He denies any fever or shortness of breath.  Symptoms are not preventing him from working or performing any of his daily activities.  On exam he is well-appearing, he does not cough throughout my interview, lungs CTAB, heart regular rate and rhythm.  We discussed if continues on this trajectory of improvement I anticipate he will get a proceed with surgery as planned on 10/10.  Patient is very anxious to get this done and states he has been waiting almost 2 years for diagnosis and solution for this issue.  I did advise him to discontinue any over-the-counter decongestion medications; okay to continue  Mucinex.  I also advised I will call him on 08/01/2024 to follow-up.  I spoke with patient via phone on 08/01/2024 and he confirmed that he is continued to feel well, discontinued all OTC medications on 07/30/2024, and has essentially no symptoms at this time other than very mild sinus congestion.    I anticipate he can proceed as planned barring acute status change.  Preop labs reviewed, unremarkable.  )         Anesthesia Quick Evaluation

## 2024-08-02 ENCOUNTER — Encounter (HOSPITAL_COMMUNITY): Payer: Self-pay | Admitting: Physician Assistant

## 2024-08-02 ENCOUNTER — Inpatient Hospital Stay (HOSPITAL_COMMUNITY): Admission: RE | Admit: 2024-08-02 | Source: Home / Self Care | Admitting: Vascular Surgery

## 2024-08-02 ENCOUNTER — Encounter (HOSPITAL_COMMUNITY): Admission: RE | Payer: Self-pay | Source: Home / Self Care

## 2024-08-02 ENCOUNTER — Other Ambulatory Visit: Payer: Self-pay

## 2024-08-02 DIAGNOSIS — G540 Brachial plexus disorders: Secondary | ICD-10-CM

## 2024-08-02 SURGERY — EXCISION, RIB
Anesthesia: General | Laterality: Right

## 2024-08-05 NOTE — Progress Notes (Signed)
 SDW. Patient updated on arrival day/time of 08/06/2024 at 0745. NPO. No further questions at this time.

## 2024-08-06 ENCOUNTER — Inpatient Hospital Stay (HOSPITAL_COMMUNITY): Admission: RE | Admit: 2024-08-06 | Source: Home / Self Care | Admitting: Vascular Surgery

## 2024-08-06 ENCOUNTER — Encounter: Payer: Self-pay | Admitting: Vascular Surgery

## 2024-08-06 SURGERY — EXCISION, RIB
Anesthesia: General | Laterality: Right

## 2024-08-07 ENCOUNTER — Telehealth: Payer: Self-pay

## 2024-08-07 ENCOUNTER — Other Ambulatory Visit: Payer: Self-pay

## 2024-08-07 DIAGNOSIS — G540 Brachial plexus disorders: Secondary | ICD-10-CM

## 2024-08-07 NOTE — Telephone Encounter (Signed)
 Attempted to call for surgery scheduling. LVM

## 2024-08-13 ENCOUNTER — Encounter: Payer: Self-pay | Admitting: Vascular Surgery

## 2024-08-14 ENCOUNTER — Other Ambulatory Visit: Payer: Self-pay

## 2024-08-14 ENCOUNTER — Encounter (HOSPITAL_COMMUNITY): Payer: Self-pay | Admitting: Vascular Surgery

## 2024-08-14 NOTE — Progress Notes (Signed)
 PCP - Dr Beverley Amor Cardiologist - none  Chest x-ray - n/a EKG - n/a Stress Test - n/a ECHO - n/a Cardiac Cath - n/a  ICD Pacemaker/Loop - n/a  Sleep Study -  n/a  Diabetes - n/a  Aspirin & Blood Thinner Instructions:  n/a  NPO  Anesthesia review: no  STOP now taking any Aspirin (unless otherwise instructed by your surgeon), Aleve, Naproxen, Ibuprofen, Motrin, Advil, Goody's, BC's, all herbal medications, fish oil, and all vitamins.   Coronavirus Screening Do you have any of the following symptoms:  Cough occas Fever (>100.41F)  yes/no: No Runny nose occas Sore throat yes/no: No Difficulty breathing/shortness of breath  yes/no: No  Have you traveled in the last 14 days and where? yes/no: No  Patient verbalized understanding of instructions that were given via phone.

## 2024-08-16 ENCOUNTER — Encounter (HOSPITAL_COMMUNITY): Payer: Self-pay | Admitting: Vascular Surgery

## 2024-08-16 ENCOUNTER — Inpatient Hospital Stay (HOSPITAL_COMMUNITY): Payer: Self-pay | Admitting: Anesthesiology

## 2024-08-16 ENCOUNTER — Other Ambulatory Visit: Payer: Self-pay

## 2024-08-16 ENCOUNTER — Encounter (HOSPITAL_COMMUNITY)
Admission: AD | Disposition: A | Discharge status: Home / Self Care | Payer: Self-pay | Source: Home / Self Care | Attending: Vascular Surgery

## 2024-08-16 ENCOUNTER — Observation Stay (HOSPITAL_COMMUNITY)
Admission: AD | Admit: 2024-08-16 | Discharge: 2024-08-17 | Disposition: A | Discharge status: Home / Self Care | Attending: Vascular Surgery | Admitting: Vascular Surgery

## 2024-08-16 ENCOUNTER — Inpatient Hospital Stay (HOSPITAL_COMMUNITY)

## 2024-08-16 ENCOUNTER — Ambulatory Visit (HOSPITAL_COMMUNITY): Payer: Self-pay | Admitting: Anesthesiology

## 2024-08-16 DIAGNOSIS — Z87891 Personal history of nicotine dependence: Secondary | ICD-10-CM | POA: Diagnosis not present

## 2024-08-16 DIAGNOSIS — G540 Brachial plexus disorders: Secondary | ICD-10-CM

## 2024-08-16 DIAGNOSIS — J45909 Unspecified asthma, uncomplicated: Secondary | ICD-10-CM | POA: Insufficient documentation

## 2024-08-16 HISTORY — PX: RIB RESECTION: SHX5077

## 2024-08-16 HISTORY — DX: Other seasonal allergic rhinitis: J30.2

## 2024-08-16 LAB — CBC
HCT: 43.7 % (ref 39.0–52.0)
Hemoglobin: 15.2 g/dL (ref 13.0–17.0)
MCH: 29.7 pg (ref 26.0–34.0)
MCHC: 34.8 g/dL (ref 30.0–36.0)
MCV: 85.5 fL (ref 80.0–100.0)
Platelets: 267 K/uL (ref 150–400)
RBC: 5.11 MIL/uL (ref 4.22–5.81)
RDW: 11.9 % (ref 11.5–15.5)
WBC: 6.2 K/uL (ref 4.0–10.5)
nRBC: 0 % (ref 0.0–0.2)

## 2024-08-16 LAB — TYPE AND SCREEN
ABO/RH(D): A POS
Antibody Screen: NEGATIVE

## 2024-08-16 LAB — URINALYSIS, ROUTINE W REFLEX MICROSCOPIC
Bilirubin Urine: NEGATIVE
Glucose, UA: NEGATIVE mg/dL
Hgb urine dipstick: NEGATIVE
Ketones, ur: NEGATIVE mg/dL
Leukocytes,Ua: NEGATIVE
Nitrite: NEGATIVE
Protein, ur: NEGATIVE mg/dL
Specific Gravity, Urine: 1.003 — ABNORMAL LOW (ref 1.005–1.030)
pH: 7 (ref 5.0–8.0)

## 2024-08-16 LAB — COMPREHENSIVE METABOLIC PANEL WITH GFR
ALT: 43 U/L (ref 0–44)
AST: 26 U/L (ref 15–41)
Albumin: 4 g/dL (ref 3.5–5.0)
Alkaline Phosphatase: 38 U/L (ref 38–126)
Anion gap: 9 (ref 5–15)
BUN: 18 mg/dL (ref 6–20)
CO2: 24 mmol/L (ref 22–32)
Calcium: 9.1 mg/dL (ref 8.9–10.3)
Chloride: 104 mmol/L (ref 98–111)
Creatinine, Ser: 1.12 mg/dL (ref 0.61–1.24)
GFR, Estimated: >60 mL/min (ref >60)
Glucose, Bld: 96 mg/dL (ref 70–99)
Potassium: 3.3 mmol/L — ABNORMAL LOW (ref 3.5–5.1)
Sodium: 137 mmol/L (ref 135–145)
Total Bilirubin: 0.7 mg/dL (ref 0.0–1.2)
Total Protein: 6.1 g/dL — ABNORMAL LOW (ref 6.5–8.1)

## 2024-08-16 LAB — APTT: aPTT: 29 s (ref 24–36)

## 2024-08-16 SURGERY — EXCISION, RIB
Anesthesia: General | Site: Axilla | Laterality: Right

## 2024-08-16 MED ORDER — BISACODYL 5 MG PO TBEC
5.0000 mg | DELAYED_RELEASE_TABLET | Freq: Every day | ORAL | Status: DC | PRN
Start: 2024-08-16 — End: 2024-08-17

## 2024-08-16 MED ORDER — MORPHINE SULFATE (PF) 2 MG/ML IV SOLN: 2.0000 mg | INTRAVENOUS | Status: DC | PRN

## 2024-08-16 MED ORDER — LIDOCAINE 2% (20 MG/ML) 5 ML SYRINGE
INTRAMUSCULAR | Status: AC
Start: 2024-08-16 — End: 2024-08-16
  Filled 2024-08-16: qty 5

## 2024-08-16 MED ORDER — PHENYLEPHRINE 80 MCG/ML (10ML) SYRINGE FOR IV PUSH (FOR BLOOD PRESSURE SUPPORT)
PREFILLED_SYRINGE | INTRAVENOUS | Status: DC | PRN
  Administered 2024-08-16: 160 ug via INTRAVENOUS
  Administered 2024-08-16: 80 ug via INTRAVENOUS

## 2024-08-16 MED ORDER — TOPIRAMATE 25 MG PO TABS
200.0000 mg | ORAL_TABLET | Freq: Every day | ORAL | Status: DC
  Administered 2024-08-16: 200 mg via ORAL
  Filled 2024-08-16: qty 8

## 2024-08-16 MED ORDER — POTASSIUM CHLORIDE CRYS ER 20 MEQ PO TBCR
20.0000 meq | EXTENDED_RELEASE_TABLET | Freq: Once | ORAL | Status: AC
  Administered 2024-08-16: 40 meq via ORAL
  Filled 2024-08-16: qty 2

## 2024-08-16 MED ORDER — PROPOFOL 10 MG/ML IV BOLUS
INTRAVENOUS | Status: AC
  Filled 2024-08-16: qty 20

## 2024-08-16 MED ORDER — ROCURONIUM BROMIDE 10 MG/ML (PF) SYRINGE
PREFILLED_SYRINGE | INTRAVENOUS | Status: DC | PRN
  Administered 2024-08-16: 20 mg via INTRAVENOUS
  Administered 2024-08-16: 50 mg via INTRAVENOUS

## 2024-08-16 MED ORDER — SUGAMMADEX SODIUM 200 MG/2ML IV SOLN
INTRAVENOUS | Status: DC | PRN
  Administered 2024-08-16: 200 mg via INTRAVENOUS

## 2024-08-16 MED ORDER — PSYLLIUM 95 % PO PACK
1.0000 | PACK | Freq: Two times a day (BID) | ORAL | Status: DC
  Administered 2024-08-16 – 2024-08-17 (×2): 1 via ORAL
  Filled 2024-08-16 (×3): qty 1

## 2024-08-16 MED ORDER — MIDAZOLAM HCL 2 MG/2ML IJ SOLN
INTRAMUSCULAR | Status: AC
  Filled 2024-08-16: qty 2

## 2024-08-16 MED ORDER — HYDRALAZINE HCL 20 MG/ML IJ SOLN: 5.0000 mg | INTRAMUSCULAR | Status: DC | PRN

## 2024-08-16 MED ORDER — CHLORHEXIDINE GLUCONATE CLOTH 2 % EX PADS: 6.0000 | MEDICATED_PAD | Freq: Once | CUTANEOUS | Status: DC

## 2024-08-16 MED ORDER — OXYCODONE HCL 5 MG/5ML PO SOLN: 5.0000 mg | Freq: Once | ORAL | Status: DC | PRN

## 2024-08-16 MED ORDER — LIDOCAINE 2% (20 MG/ML) 5 ML SYRINGE
INTRAMUSCULAR | Status: DC | PRN
  Administered 2024-08-16: 40 mg via INTRAVENOUS

## 2024-08-16 MED ORDER — KETOROLAC TROMETHAMINE 30 MG/ML IJ SOLN
INTRAMUSCULAR | Status: DC | PRN
  Administered 2024-08-16: 30 mg via INTRAVENOUS

## 2024-08-16 MED ORDER — SODIUM CHLORIDE 0.9 % IV SOLN: INTRAVENOUS | Status: AC

## 2024-08-16 MED ORDER — CEFAZOLIN SODIUM-DEXTROSE 1-4 GM/50ML-% IV SOLN
1.0000 g | Freq: Four times a day (QID) | INTRAVENOUS | Status: AC
  Administered 2024-08-16 – 2024-08-17 (×2): 1 g via INTRAVENOUS
  Filled 2024-08-16 (×2): qty 50

## 2024-08-16 MED ORDER — ONDANSETRON HCL 4 MG/2ML IJ SOLN
INTRAMUSCULAR | Status: AC
Start: 2024-08-16 — End: 2024-08-16
  Filled 2024-08-16: qty 2

## 2024-08-16 MED ORDER — CHLORHEXIDINE GLUCONATE 0.12 % MT SOLN
OROMUCOSAL | Status: AC
  Administered 2024-08-16: 15 mL via OROMUCOSAL
  Filled 2024-08-16: qty 15

## 2024-08-16 MED ORDER — PROPOFOL 10 MG/ML IV BOLUS
INTRAVENOUS | Status: DC | PRN
  Administered 2024-08-16: 200 mg via INTRAVENOUS

## 2024-08-16 MED ORDER — FENTANYL CITRATE (PF) 250 MCG/5ML IJ SOLN
INTRAMUSCULAR | Status: AC
  Filled 2024-08-16: qty 5

## 2024-08-16 MED ORDER — CEFAZOLIN SODIUM-DEXTROSE 2-4 GM/100ML-% IV SOLN
2.0000 g | INTRAVENOUS | Status: AC
  Administered 2024-08-16: 2 g via INTRAVENOUS

## 2024-08-16 MED ORDER — FENTANYL CITRATE (PF) 250 MCG/5ML IJ SOLN
INTRAMUSCULAR | Status: DC | PRN
  Administered 2024-08-16 (×3): 50 ug via INTRAVENOUS

## 2024-08-16 MED ORDER — CHLORHEXIDINE GLUCONATE 0.12 % MT SOLN: 15.0000 mL | Freq: Once | OROMUCOSAL | Status: AC

## 2024-08-16 MED ORDER — CEFAZOLIN SODIUM-DEXTROSE 2-4 GM/100ML-% IV SOLN: 2.0000 g | INTRAVENOUS | Status: DC

## 2024-08-16 MED ORDER — TRAZODONE HCL 100 MG PO TABS
100.0000 mg | ORAL_TABLET | Freq: Every evening | ORAL | Status: DC | PRN
  Administered 2024-08-16: 100 mg via ORAL
  Filled 2024-08-16: qty 1

## 2024-08-16 MED ORDER — SODIUM CHLORIDE 0.9 % IV SOLN: INTRAVENOUS | Status: DC

## 2024-08-16 MED ORDER — PHENYLEPHRINE 80 MCG/ML (10ML) SYRINGE FOR IV PUSH (FOR BLOOD PRESSURE SUPPORT)
PREFILLED_SYRINGE | INTRAVENOUS | Status: AC
  Filled 2024-08-16: qty 10

## 2024-08-16 MED ORDER — HEPARIN SODIUM (PORCINE) 5000 UNIT/ML IJ SOLN
5000.0000 [IU] | Freq: Three times a day (TID) | INTRAMUSCULAR | Status: DC
  Administered 2024-08-17: 5000 [IU] via SUBCUTANEOUS
  Filled 2024-08-16: qty 1

## 2024-08-16 MED ORDER — ALUM & MAG HYDROXIDE-SIMETH 200-200-20 MG/5ML PO SUSP: 15.0000 mL | ORAL | Status: DC | PRN

## 2024-08-16 MED ORDER — OXYCODONE HCL 5 MG PO TABS: 5.0000 mg | ORAL_TABLET | Freq: Once | ORAL | Status: DC | PRN

## 2024-08-16 MED ORDER — MIDAZOLAM HCL (PF) 2 MG/2ML IJ SOLN
INTRAMUSCULAR | Status: DC | PRN
  Administered 2024-08-16: 2 mg via INTRAVENOUS

## 2024-08-16 MED ORDER — ACETAMINOPHEN 650 MG RE SUPP: 325.0000 mg | RECTAL | Status: DC | PRN

## 2024-08-16 MED ORDER — ONDANSETRON HCL 4 MG/2ML IJ SOLN
INTRAMUSCULAR | Status: DC | PRN
  Administered 2024-08-16: 4 mg via INTRAVENOUS

## 2024-08-16 MED ORDER — CEFAZOLIN SODIUM-DEXTROSE 2-4 GM/100ML-% IV SOLN
INTRAVENOUS | Status: AC
  Filled 2024-08-16: qty 100

## 2024-08-16 MED ORDER — KETOROLAC TROMETHAMINE 30 MG/ML IJ SOLN
INTRAMUSCULAR | Status: AC
  Filled 2024-08-16: qty 1

## 2024-08-16 MED ORDER — HYDROMORPHONE HCL 1 MG/ML IJ SOLN
INTRAMUSCULAR | Status: AC
  Filled 2024-08-16: qty 1

## 2024-08-16 MED ORDER — PANTOPRAZOLE SODIUM 40 MG PO TBEC
40.0000 mg | DELAYED_RELEASE_TABLET | Freq: Every day | ORAL | Status: DC
  Administered 2024-08-16 – 2024-08-17 (×2): 40 mg via ORAL
  Filled 2024-08-16 (×2): qty 1

## 2024-08-16 MED ORDER — EPHEDRINE SULFATE-NACL 50-0.9 MG/10ML-% IV SOSY
PREFILLED_SYRINGE | INTRAVENOUS | Status: DC | PRN
  Administered 2024-08-16: 10 mg via INTRAVENOUS

## 2024-08-16 MED ORDER — DEXAMETHASONE SOD PHOSPHATE PF 10 MG/ML IJ SOLN
INTRAMUSCULAR | Status: DC | PRN
  Administered 2024-08-16: 10 mg via INTRAVENOUS

## 2024-08-16 MED ORDER — ACETAMINOPHEN 325 MG PO TABS
325.0000 mg | ORAL_TABLET | ORAL | Status: DC | PRN
  Administered 2024-08-16: 650 mg via ORAL
  Filled 2024-08-16: qty 2

## 2024-08-16 MED ORDER — OXYCODONE-ACETAMINOPHEN 5-325 MG PO TABS
1.0000 | ORAL_TABLET | ORAL | Status: DC | PRN
  Administered 2024-08-17: 1 via ORAL
  Filled 2024-08-16: qty 1
  Filled 2024-08-16: qty 2

## 2024-08-16 MED ORDER — ONDANSETRON HCL 4 MG/2ML IJ SOLN: 4.0000 mg | Freq: Four times a day (QID) | INTRAMUSCULAR | Status: DC | PRN

## 2024-08-16 MED ORDER — PREGABALIN 25 MG PO CAPS
50.0000 mg | ORAL_CAPSULE | Freq: Three times a day (TID) | ORAL | Status: DC
  Administered 2024-08-16 – 2024-08-17 (×3): 50 mg via ORAL
  Filled 2024-08-16 (×3): qty 2

## 2024-08-16 MED ORDER — ACETAMINOPHEN 10 MG/ML IV SOLN: 1000.0000 mg | Freq: Once | INTRAVENOUS | Status: DC | PRN

## 2024-08-16 MED ORDER — PHENOL 1.4 % MT LIQD: 1.0000 | OROMUCOSAL | Status: DC | PRN

## 2024-08-16 MED ORDER — ROCURONIUM BROMIDE 10 MG/ML (PF) SYRINGE
PREFILLED_SYRINGE | INTRAVENOUS | Status: AC
  Filled 2024-08-16: qty 10

## 2024-08-16 MED ORDER — LABETALOL HCL 5 MG/ML IV SOLN: 10.0000 mg | INTRAVENOUS | Status: DC | PRN

## 2024-08-16 MED ORDER — HYDROMORPHONE HCL 1 MG/ML IJ SOLN
0.2500 mg | INTRAMUSCULAR | Status: DC | PRN
  Administered 2024-08-16: 0.5 mg via INTRAVENOUS

## 2024-08-16 MED ORDER — POLYETHYLENE GLYCOL 3350 17 G PO PACK
17.0000 g | PACK | Freq: Every day | ORAL | Status: DC | PRN
Start: 2024-08-16 — End: 2024-08-17

## 2024-08-16 MED ORDER — MEPERIDINE HCL 25 MG/ML IJ SOLN
6.2500 mg | INTRAMUSCULAR | Status: DC | PRN
  Filled 2024-08-16: qty 1

## 2024-08-16 MED ORDER — GUAIFENESIN-DM 100-10 MG/5ML PO SYRP
15.0000 mL | ORAL_SOLUTION | ORAL | Status: DC | PRN
Start: 2024-08-16 — End: 2024-08-17
  Administered 2024-08-17: 15 mL via ORAL
  Filled 2024-08-16: qty 15

## 2024-08-16 MED ORDER — FAMOTIDINE 20 MG PO TABS
20.0000 mg | ORAL_TABLET | Freq: Two times a day (BID) | ORAL | Status: DC
  Administered 2024-08-16 – 2024-08-17 (×2): 20 mg via ORAL
  Filled 2024-08-16 (×2): qty 1

## 2024-08-16 MED ORDER — 0.9 % SODIUM CHLORIDE (POUR BTL) OPTIME
TOPICAL | Status: DC | PRN
  Administered 2024-08-16: 1000 mL

## 2024-08-16 MED ORDER — EPHEDRINE 5 MG/ML INJ
INTRAVENOUS | Status: AC
  Filled 2024-08-16: qty 5

## 2024-08-16 MED ORDER — HEPARIN 6000 UNIT IRRIGATION SOLUTION
Status: AC
  Filled 2024-08-16: qty 500

## 2024-08-16 MED ORDER — LACTATED RINGERS IV SOLN: INTRAVENOUS | Status: DC

## 2024-08-16 MED ORDER — ORAL CARE MOUTH RINSE: 15.0000 mL | Freq: Once | OROMUCOSAL | Status: AC

## 2024-08-16 MED ORDER — DROPERIDOL 2.5 MG/ML IJ SOLN: 0.6250 mg | Freq: Once | INTRAMUSCULAR | Status: DC | PRN

## 2024-08-16 MED ORDER — METOPROLOL TARTRATE 5 MG/5ML IV SOLN: 2.0000 mg | INTRAVENOUS | Status: DC | PRN

## 2024-08-16 SURGICAL SUPPLY — 26 items
BAG COUNTER SPONGE SURGICOUNT (BAG) ×2 IMPLANT
BNDG COHESIVE 4X5 TAN STRL LF (GAUZE/BANDAGES/DRESSINGS) ×2 IMPLANT
BNDG COHESIVE 6X5 TAN NS LF (GAUZE/BANDAGES/DRESSINGS) ×2 IMPLANT
CLIP LIGATING EXTRA MED SLVR (CLIP) ×2 IMPLANT
CLIP LIGATING EXTRA SM BLUE (MISCELLANEOUS) ×2 IMPLANT
CNTNR URN SCR LID CUP LEK RST (MISCELLANEOUS) ×2 IMPLANT
DERMABOND ADVANCED .7 DNX12 (GAUZE/BANDAGES/DRESSINGS) ×4 IMPLANT
DRAPE INCISE IOBAN 66X45 STRL (DRAPES) ×2 IMPLANT
HEMOSTAT HEMOSTS OSTN BONE 2.5 (HEMOSTASIS) IMPLANT
KIT BASIN OR (CUSTOM PROCEDURE TRAY) ×2 IMPLANT
PACK CV ACCESS (CUSTOM PROCEDURE TRAY) ×2 IMPLANT
PACK UNIVERSAL I (CUSTOM PROCEDURE TRAY) ×2 IMPLANT
POWDER SURGICEL 3.0 GRAM (HEMOSTASIS) IMPLANT
SOLN 0.9% NACL POUR BTL 1000ML (IV SOLUTION) ×2 IMPLANT
SOLN STERILE WATER BTL 1000 ML (IV SOLUTION) ×2 IMPLANT
SPONGE INTESTINAL PEANUT (DISPOSABLE) ×2 IMPLANT
SPONGE T-LAP 18X18 ~~LOC~~+RFID (SPONGE) ×2 IMPLANT
STOCKINETTE 3IN STRL (GAUZE/BANDAGES/DRESSINGS) IMPLANT
STOCKINETTE 6 STRL (DRAPES) IMPLANT
STOCKINETTE IMPERVIOUS LG (DRAPES) ×2 IMPLANT
SUT MNCRL AB 4-0 PS2 18 (SUTURE) ×2 IMPLANT
SUT VIC AB 2-0 CT1 TAPERPNT 27 (SUTURE) ×2 IMPLANT
SUT VIC AB 3-0 SH 27X BRD (SUTURE) ×2 IMPLANT
TOWEL GREEN STERILE (TOWEL DISPOSABLE) ×4 IMPLANT
TUBE CONNECTING 12X1/4 (SUCTIONS) IMPLANT
UNDERPAD 30X36 HEAVY ABSORB (UNDERPADS AND DIAPERS) ×2 IMPLANT

## 2024-08-16 NOTE — H&P (Signed)
 HPI:   Bernard [name] is a 38 y.o. male with history concerning for right neurogenic thoracic outlet syndrome.  Patient has undergone multiple previous evaluations including MRI and CT angio.  He has completed physical therapy and been evaluated by sports medicine as well as orthopedic surgery.  More recently has undergone right anterior scalene muscle block.  He states that after the muscle block all of his symptoms resolved for approximately 2 days.  Specifically his symptoms of severe pain with abduction of his right upper extremity was completely resolved.  He has now returned to having limitations due to very limited lifting of weights and cannot raise his hand over his head or even touch his hair without severe pain.  This had all resolved after scalene muscle block.  He is here today to discuss possible surgical intervention.       Past Medical History:  Diagnosis Date   Fundic gland polyposis of stomach     GERD (gastroesophageal reflux disease)     Hemorrhoid     Sebaceous cyst               Family History  Problem Relation Age of Onset   Allergic rhinitis Mother     Heart disease Father          valve replacement   Hypertension Father     GER disease Father     Dementia Father          early onset   Allergic rhinitis Sister     Breast cancer Maternal Grandmother     Colon cancer Neg Hx               Past Surgical History:  Procedure Laterality Date   ESOPHAGEAL MANOMETRY N/A 10/09/2019    Procedure: ESOPHAGEAL MANOMETRY (EM);  Surgeon: Avram Lupita BRAVO, MD;  Location: [facility];  Service: Endoscopy;  Laterality: N/A;   ESOPHAGOGASTRODUODENOSCOPY N/A 05/18/2015    Procedure: ESOPHAGOGASTRODUODENOSCOPY (EGD);  Surgeon: Lamar JONETTA Aho, MD;  Location: [facility];  Service: Endoscopy;  Laterality: N/A;   HEMORRHOID BANDING       PH IMPEDANCE STUDY   10/09/2019    Procedure: PH IMPEDANCE STUDY;  Surgeon: Avram Lupita BRAVO, MD;  Location: [facility];  Service:  Endoscopy;;   WISDOM TOOTH EXTRACTION              Short Social History:  Social History         Tobacco Use   Smoking status: Former      Current packs/day: 0.00      Average packs/day: 1 pack/day for 10.0 years (10.0 ttl pk-yrs)      Types: E-cigarettes, Cigarettes      Start date: 11/24/2002      Quit date: 11/24/2012      Years since quitting: 11.5      Passive exposure: Never   Smokeless tobacco: Never   Tobacco comments:      quit e cigarette 2016  Substance Use Topics   Alcohol use: Yes      Alcohol/week: 0.0 standard drinks of alcohol      Comment: occasional      Allergies       Allergies  Allergen Reactions   Mold Extract [Trichophyton]        Sneezing, runny nose.               Current Outpatient Medications  Medication Sig Dispense Refill   cetirizine (ZYRTEC) 10 MG tablet Take  10 mg by mouth every morning.        dexlansoprazole  (DEXILANT ) 60 MG capsule Take 1 capsule (60 mg total) by mouth daily. 100 capsule 3   diclofenac  (CATAFLAM ) 50 MG tablet Take 1 to 2 tablets by mouth at onset of headache as needed. **Maximum dose of 2 tablets in 24 hours** 15 tablet 1   famotidine  (PEPCID ) 20 MG tablet Take 1 tablet by mouth twice daily. 180 tablet 2   pregabalin  (LYRICA ) 50 MG capsule Take 1 capsule (50 mg total) by mouth 3 (three) times daily. 270 capsule 0   psyllium (METAMUCIL) 58.6 % packet Take 1 packet by mouth daily.       topiramate  (TOPAMAX ) 100 MG tablet Take 1 tablet (100 mg total) by mouth at bedtime. 90 tablet 1   TURMERIC PO Take by mouth.          No current facility-administered medications for this visit.        Review of Systems  Constitutional:  Constitutional negative. HENT: HENT negative.  Eyes: Eyes negative.  Respiratory: Respiratory negative.  Cardiovascular: Cardiovascular negative.  GI: Gastrointestinal negative.  Musculoskeletal:       Right upper extremity severe pain limiting mobility Neurological: Neurological  negative. Hematologic: Hematologic/lymphatic negative.  Psychiatric: Psychiatric negative.          Objective:    Vitals:   08/16/24 0649  BP: (!) 129/94  Pulse: 86  Resp: 18  Temp: 98.5 F (36.9 C)  SpO2: 98%      Body mass index is 22.45 kg/m.   Physical Exam HENT:     Head: Normocephalic.     Nose: Nose normal.  Eyes:     Pupils: Pupils are equal, round, and reactive to light.  Cardiovascular:     Rate and Rhythm: Normal rate.     Pulses: Normal pulses.  Pulmonary:     Effort: Pulmonary effort is normal.  Abdominal:     General: Abdomen is flat.  Musculoskeletal:     Comments: He has minimal tenderness to palpation in the right supraclavicular area Patient has limited ability  Neurological:     General: No focal deficit present.     Mental Status: He is alert.  Psychiatric:        Mood and Affect: Mood normal.       Data: Previous scalene block results reviewed  Assessment 38 year old male with concern for right sided neurogenic thoracic outlet syndrome.  His symptoms are somewhat atypical although he has undergone rigorous evaluation that has not elicited any other underlying cause.  He did undergo right scalene block with 100% resolution of symptoms for approximately 48 hours which would suggest an underlying neurogenic thoracic outlet component.  I have discussed with him the risk and benefits of transaxillary right first rib resection including the risk of injury to the surrounding structures which would include the brachial plexus, subclavian or axillary artery and the subclavian or axillary vein.  We discussed the possible causes of underlying pneumothorax up to requiring chest tube and up to 2 days of hospitalization.  We discussed that he may not have complete or any resolution of symptoms given that he has relatively atypical symptoms.  We discussed that he will need ongoing physical therapy 2 weeks after recovering from initial surgical procedure.  He  demonstrates very good understanding without another cause for his underlying symptoms he wishes to proceed.  We did discuss that I performed this from a transaxillary approach particularly for neurogenic  thoracic outlet and he demonstrates good understanding.  I would not perform pectoralis minor release at the time of the index surgery given that most of his symptoms appear to be cephalad to the pectoralis minor and we have not elicited any symptoms from the pec Minor itself.  All of his questions were answered in depth and he demonstrated her standing.    Heru Montz C. Sheree, MD [facility] [place] Office: [phone] Pager: [phone]

## 2024-08-16 NOTE — Transfer of Care (Signed)
 Immediate Anesthesia Transfer of Care Note  Patient: Rohin R Wojcicki  Procedure(s) Performed: TRANSAXILLARY RIGHT FIRST RIB RESECTION (Right: Axilla)  Patient Location: PACU  Anesthesia Type:General  Level of Consciousness: awake and alert   Airway & Oxygen Therapy: Patient Spontanous Breathing and Patient connected to nasal cannula oxygen  Post-op Assessment: Report given to RN and Post -op Vital signs reviewed and stable  Post vital signs: Reviewed and stable  Last Vitals:  Vitals Value Taken Time  BP    Temp    Pulse 57 08/16/24 11:22  Resp 12 08/16/24 11:22  SpO2 97 % 08/16/24 11:22  Vitals shown include unfiled device data.  Last Pain:  Vitals:   08/16/24 0716  TempSrc:   PainSc: 0-No pain         Complications: No notable events documented.

## 2024-08-16 NOTE — Op Note (Signed)
    Patient name: Bernard [name] MRN: [id number] DOB: 1986-07-23 Sex: male  08/16/2024 Pre-operative Diagnosis: Neurogenic right-sided thoracic outlet syndrome Post-operative diagnosis:  Same Surgeon:  Penne C. Sheree, MD Assistants: Adina Sender, PA; Norman Pizza, [place] student Procedure Performed:  Transaxillary right first rib resection  Indications: 38 year old male with history of neurogenic thoracic outlet syndrome on the right with severely limiting symptoms.  He has completed over 1 year of physical therapy and has undergone right anterior scalene block with complete resolution of his symptoms for 48 hours.  Symptoms have now recurred.  We have discussed the risk and benefits of proceeding with right first rib resection as well as transaxillary approach and he demonstrates good understanding and agrees to proceed.   Experience assistant was necessary to facilitate exposure of the axilla with retraction of the arm.  Findings: Normal anatomy.  First rib resection removed total 4 cm in length.  No pneumothorax at completion.   Procedure:  The patient was identified in the holding area and taken to the operating room where he was initially placed supine and general anesthesia was induced.  He was then placed in the left lateral decubitus position and sterilely prepped and draped in the left upper extremity as well as chest in the usual fashion, timeout was called and antibiotics were administered.  We began with an incision at the hairbearing line of the axilla between the pectoralis major and latissimus dorsi.  We dissected using cautery down to the chest wall.  We then bluntly dissected cephalad identified the intercostal brachial nerve and this was protected.  We easily visualized the axillary contents including the axillary vein, axillary artery and brachial plexus.  The arm was then raised higher we could easily visualize the anterior scalene muscle.  Using a Cobb elevator we elevated the  intercostal muscles first laterally followed by medially off of the first rib and separated the pleura off of the back of the rib bluntly as well.  We then sharply divided the anterior scalene 1 cm above its rib attachments using scissors.  An [employer] illustrator was then used to bluntly divide the scalene medius muscle fibers.  The rib was then transected using a straight bone cutter anteriorly.  Posteriorly we did free up more of the muscle attachments bluntly and then divided this just posteriorly to the brachial plexus.  The rib edges were smoothed with a rasp and the rib itself was passed off as specimen.  We obtained hemostasis using gentle compression.  We then filled the axilla with saline and performed Valsalva breath-hold maneuver on 2 separate occasions which demonstrated no evidence of pneumothorax.  We then really inspected the axillary contents there was no additional bleeding.  The subcutaneous tissue was closed with Vicryl suture and the skin was closed with Monocryl and Dermabond was placed at the skin level.  He was then returned to the supine position and awakened from anesthesia.  He was transferred to the recovery area in stable condition.  All counts were correct at completion.  EBL: 20 cc   Narcissa Melder C. Sheree, MD [facility] [place] Office: [phone] Pager: [phone]

## 2024-08-16 NOTE — Anesthesia Preprocedure Evaluation (Addendum)
 Anesthesia Evaluation  Patient identified by MRN, date of birth, ID band Patient awake    Reviewed: Allergy & Precautions, NPO status , Patient's Chart, lab work & pertinent test results  Airway Mallampati: III  TM Distance: >3 FB Neck ROM: Full    Dental  (+) Teeth Intact, Dental Advisory Given   Pulmonary asthma , former smoker   breath sounds clear to auscultation       Cardiovascular negative cardio ROS  Rhythm:Regular Rate:Normal     Neuro/Psych  Headaches PSYCHIATRIC DISORDERS  Depression     Neuromuscular disease    GI/Hepatic Neg liver ROS,GERD  Medicated,,  Endo/Other  negative endocrine ROS    Renal/GU negative Renal ROS     Musculoskeletal negative musculoskeletal ROS (+)    Abdominal   Peds  Hematology negative hematology ROS (+)   Anesthesia Other Findings   Reproductive/Obstetrics                              Anesthesia Physical Anesthesia Plan  ASA: 3  Anesthesia Plan: General   Post-op Pain Management: Tylenol PO (pre-op)* and Toradol IV (intra-op)*   Induction: Intravenous  PONV Risk Score and Plan: 3 and Ondansetron , Dexamethasone  and Midazolam  Airway Management Planned: Oral ETT  Additional Equipment: None  Intra-op Plan:   Post-operative Plan: Extubation in OR  Informed Consent: I have reviewed the patients History and Physical, chart, labs and discussed the procedure including the risks, benefits and alternatives for the proposed anesthesia with the patient or authorized representative who has indicated his/her understanding and acceptance.     Dental advisory given  Plan Discussed with: CRNA  Anesthesia Plan Comments:          Anesthesia Quick Evaluation

## 2024-08-16 NOTE — Anesthesia Procedure Notes (Signed)
 Procedure Name: Intubation Date/Time: 08/16/2024 9:35 AM  Performed by: Julien Manus, CRNAPre-anesthesia Checklist: Patient identified, Emergency Drugs available, Suction available and Patient being monitored Patient Re-evaluated:Patient Re-evaluated prior to induction Oxygen Delivery Method: Circle System Utilized Preoxygenation: Pre-oxygenation with 100% oxygen Induction Type: IV induction Ventilation: Mask ventilation without difficulty Laryngoscope Size: Mac and 4 Grade View: Grade I Tube type: Oral Tube size: 7.5 mm Number of attempts: 1 Airway Equipment and Method: Stylet and Oral airway Placement Confirmation: ETT inserted through vocal cords under direct vision, positive ETCO2 and breath sounds checked- equal and bilateral Secured at: 22 cm Tube secured with: Tape Dental Injury: Teeth and Oropharynx as per pre-operative assessment

## 2024-08-16 NOTE — Anesthesia Postprocedure Evaluation (Signed)
 Anesthesia Post Note  Patient: [name], television/film/video  Procedure(s) Performed: TRANSAXILLARY RIGHT FIRST RIB RESECTION (Right: Axilla)     Patient location during evaluation: PACU Anesthesia Type: General Level of consciousness: awake and alert Pain management: pain level controlled Vital Signs Assessment: post-procedure vital signs reviewed and stable Respiratory status: spontaneous breathing, nonlabored ventilation, respiratory function stable and patient connected to nasal cannula oxygen Cardiovascular status: blood pressure returned to baseline and stable Postop Assessment: no apparent nausea or vomiting Anesthetic complications: no   No notable events documented.  Last Vitals:  Vitals:   08/16/24 1315 08/16/24 1330  BP: 123/72 120/80  Pulse: 74 83  Resp: 14 17  Temp:  36.5 C  SpO2: 99% 99%    Last Pain:  Vitals:   08/16/24 1130  TempSrc:   PainSc: Asleep                 Bernard [name]

## 2024-08-17 ENCOUNTER — Encounter (HOSPITAL_COMMUNITY): Payer: Self-pay | Admitting: Vascular Surgery

## 2024-08-17 ENCOUNTER — Inpatient Hospital Stay (HOSPITAL_COMMUNITY)

## 2024-08-17 DIAGNOSIS — Z4889 Encounter for other specified surgical aftercare: Secondary | ICD-10-CM

## 2024-08-17 DIAGNOSIS — G540 Brachial plexus disorders: Secondary | ICD-10-CM | POA: Diagnosis not present

## 2024-08-17 LAB — CBC
HCT: 39.6 % (ref 39.0–52.0)
Hemoglobin: 13.6 g/dL (ref 13.0–17.0)
MCH: 29.9 pg (ref 26.0–34.0)
MCHC: 34.3 g/dL (ref 30.0–36.0)
MCV: 87 fL (ref 80.0–100.0)
Platelets: 263 K/uL (ref 150–400)
RBC: 4.55 MIL/uL (ref 4.22–5.81)
RDW: 12 % (ref 11.5–15.5)
WBC: 13.8 K/uL — ABNORMAL HIGH (ref 4.0–10.5)
nRBC: 0 % (ref 0.0–0.2)

## 2024-08-17 LAB — BASIC METABOLIC PANEL WITH GFR
Anion gap: 9 (ref 5–15)
BUN: 15 mg/dL (ref 6–20)
CO2: 20 mmol/L — ABNORMAL LOW (ref 22–32)
Calcium: 8.3 mg/dL — ABNORMAL LOW (ref 8.9–10.3)
Chloride: 107 mmol/L (ref 98–111)
Creatinine, Ser: 0.99 mg/dL (ref 0.61–1.24)
GFR, Estimated: >60 mL/min (ref >60)
Glucose, Bld: 126 mg/dL — ABNORMAL HIGH (ref 70–99)
Potassium: 3.5 mmol/L (ref 3.5–5.1)
Sodium: 136 mmol/L (ref 135–145)

## 2024-08-17 MED ORDER — OXYCODONE-ACETAMINOPHEN 5-325 MG PO TABS: 1.0000 | ORAL_TABLET | Freq: Four times a day (QID) | ORAL | 0 refills | Status: AC | PRN

## 2024-08-17 NOTE — Progress Notes (Addendum)
  Progress Note    08/17/2024 8:43 AM 1 Day Post-Op  Subjective:  no complaints   Vitals:   08/16/24 2248 08/17/24 0450  BP: 116/77 113/71  Pulse: 83 73  Resp: 17 18  Temp: 97.6 F (36.4 C) (!) 97.1 F (36.2 C)  SpO2: 98% 99%   Physical Exam: Lungs:  non labored Incisions:  R axilla incision c/d/i Extremities:  palpable R radial pulse Neurologic: A&O  CBC    Component Value Date/Time   WBC 13.8 (H) 08/17/2024 0339   RBC 4.55 08/17/2024 0339   HGB 13.6 08/17/2024 0339   HCT 39.6 08/17/2024 0339   PLT 263 08/17/2024 0339   MCV 87.0 08/17/2024 0339   MCH 29.9 08/17/2024 0339   MCHC 34.3 08/17/2024 0339   RDW 12.0 08/17/2024 0339   LYMPHSABS 1.8 08/02/2022 1027   MONOABS 0.4 08/02/2022 1027   EOSABS 0.1 08/02/2022 1027   BASOSABS 0.1 08/02/2022 1027    BMET    Component Value Date/Time   NA 136 08/17/2024 0339   K 3.5 08/17/2024 0339   CL 107 08/17/2024 0339   CO2 20 (L) 08/17/2024 0339   GLUCOSE 126 (H) 08/17/2024 0339   BUN 15 08/17/2024 0339   CREATININE 0.99 08/17/2024 0339   CALCIUM 8.3 (L) 08/17/2024 0339   GFRNONAA >60 08/17/2024 0339   GFRAA >60 05/01/2015 0525    INR No results found for: INR   Intake/Output Summary (Last 24 hours) at 08/17/2024 0843 Last data filed at 08/17/2024 0700 Gross per 24 hour  Intake 1209.08 ml  Output 5 ml  Net 1204.08 ml     Assessment/Plan:  38 y.o. male is s/p R 1st rib resection 1 Day Post-Op   Subjectively, Mr. Gaba is doing well.  R axilla incision well appearing.  R hand well perfused with motor and sensation intact.  Oxygenating well this morning.  Possible small apical pneumothorax on CXR yesterday.  Repeat CXR this morning.  Pending final read on CXR we will plan on d/c home today.  Office will arrange follow up with Dr. Sheree in 2 weeks.   Donnice Sender, PA-C [facility] [phone] 08/17/2024 8:43 AM  VASCULAR STAFF ADDENDUM: I have independently interviewed and  examined the patient. I agree with the above.  I did not appreciate a pneumothorax on this morning's chest x-ray.  Will wait for the final read for radiology.  He has a small residual that has not changed in size, I would still plan for home.  Home pending read.   Fonda FORBES Rim MD [facility] Phone Number: [phone] 08/17/2024 9:05 AM

## 2024-08-17 NOTE — Plan of Care (Signed)
 Plan of care is reviewed. Pt has been progressing. He is stable hemodynamically, afebrile, NSR on the monitor, normal respiratory effort, no acute distress note. Pt is able to sleep well overnight with no major complaints. We will continue to monitor.   Problem: Clinical Measurements: Goal: Ability to maintain clinical measurements within normal limits will improve Outcome: Progressing Goal: Will remain free from infection Outcome: Progressing Goal: Diagnostic test results will improve Outcome: Progressing Goal: Respiratory complications will improve Outcome: Progressing Goal: Cardiovascular complication will be avoided Outcome: Progressing   Problem: Health Behavior/Discharge Planning: Goal: Ability to manage health-related needs will improve Outcome: Progressing   Problem: Activity: Goal: Risk for activity intolerance will decrease Outcome: Progressing   Problem: Pain Managment: Goal: General experience of comfort will improve and/or be controlled Outcome: Progressing   Problem: Skin Integrity: Goal: Risk for impaired skin integrity will decrease Outcome: Progressing   Staci Dash, RN

## 2024-08-18 NOTE — Discharge Summary (Signed)
  Discharge Summary  Patient ID: Bernard [name] [id number] 38 y.o. [birth year]  Admit date: 08/16/2024  Discharge date and time: 08/17/2024  1:17 PM   Admitting Physician: Penne Lonni Colorado, MD   Discharge Physician: same  Admission Diagnoses: Thoracic outlet syndrome [G54.0]  Discharge Diagnoses: same  Admission Condition: fair  Discharged Condition: fair  Indication for Admission: post op care  Hospital Course: [name] is a 38 year old male who was brought in as an outpatient and underwent right first rib resection by Dr. Colorado on 08/16/2024 due to neurogenic thoracic outlet syndrome.  Postoperatively, chest x-ray showed a small right apical pneumothorax.  POD #1 he continued to oxygenate well.  He had near full range of motion in the right arm.  Right hand was well-perfused with a palpable radial pulse.  He was feeling ready for discharge home.  Repeat chest x-ray showed an improvement in the apical pneumothorax.  He was provided a sling for comfort.  He was prescribed 1 to 2 days of narcotic pain medication for continued postoperative pain control.  He will follow-up in the office with Dr. Gari in 2 weeks at which point he will be referred to occupational therapy.  He was discharged home in stable condition.  Consults: None  Treatments: surgery: Right first rib resection by Dr. Colorado on 08/16/2024  Discharge Exam: See progress note 08/17/24 Vitals:   08/16/24 2248 08/17/24 0450  BP: 116/77 113/71  Pulse: 83 73  Resp: 17 18  Temp: 97.6 F (36.4 C) (!) 97.1 F (36.2 C)  SpO2: 98% 99%     Disposition: Discharge disposition: 01-Home or Self Care       Patient Instructions:  Allergies as of 08/17/2024       Reactions   Mold Extract [trichophyton]    Sneezing, runny nose.    Nickel Rash        Medication List     TAKE these medications    cetirizine 10 MG tablet Commonly known as: ZYRTEC Take 10 mg by mouth every morning.    dexlansoprazole  60 MG capsule Commonly known as: DEXILANT  Take 1 capsule (60 mg total) by mouth daily.   famotidine  20 MG tablet Commonly known as: PEPCID  Take 1 tablet by mouth twice daily.   oxyCODONE-acetaminophen 5-325 MG tablet Commonly known as: PERCOCET/ROXICET Take 1 tablet by mouth every 6 (six) hours as needed for moderate pain (pain score 4-6).   pregabalin  50 MG capsule Commonly known as: LYRICA  Take 1 capsule (50 mg total) by mouth 3 (three) times daily.   psyllium 58.6 % packet Commonly known as: METAMUCIL Take 1 packet by mouth 2 (two) times daily. 2 tblspoons Twice daily   silver sulfADIAZINE 1 % cream Commonly known as: SILVADENE Apply 1 Application topically 2 (two) times daily.   topiramate  100 MG tablet Commonly known as: TOPAMAX  Take 1 tablet (100 mg total) by mouth at bedtime. What changed: how much to take   traZODone 50 MG tablet Commonly known as: DESYREL Take 100 mg by mouth at bedtime as needed for sleep.   triamcinolone cream 0.1 % Commonly known as: KENALOG Apply 1 Application topically 2 (two) times daily.       Activity: activity as tolerated Diet: regular diet Wound Care: keep wound clean and dry  Follow-up with Dr. Colorado in 2 weeks.  SignedBETHA Donnice Sender, PA-C 08/18/2024 8:44 AM [facility] Office: [phone]

## 2024-08-19 ENCOUNTER — Encounter: Payer: Self-pay | Admitting: Vascular Surgery

## 2024-08-19 LAB — SURGICAL PATHOLOGY

## 2024-08-21 ENCOUNTER — Encounter: Payer: Self-pay | Admitting: Vascular Surgery

## 2024-09-04 ENCOUNTER — Ambulatory Visit: Attending: Vascular Surgery | Admitting: Vascular Surgery

## 2024-09-04 ENCOUNTER — Encounter: Payer: Self-pay | Admitting: Vascular Surgery

## 2024-09-04 VITALS — BP 120/85 | HR 71 | Temp 98.1°F | Ht 69.0 in | Wt 152.0 lb

## 2024-09-04 DIAGNOSIS — G540 Brachial plexus disorders: Secondary | ICD-10-CM

## 2024-09-04 NOTE — Progress Notes (Signed)
     Subjective:     Patient ID: Bernard [name], male   DOB: [birth year], 38 y.o.   MRN: [id number]  HPI 38 year old male status post right first rib resection for neurogenic thoracic outlet syndrome.  He says that his symptoms are approximately 90% better.  He has been able to wrestle with his son on the floor although he still has limitations of abduction laterally of his right upper extremity.   Review of Systems As above    Objective:   Physical Exam Vitals:   09/04/24 1038  BP: 120/85  Pulse: 71  Temp: 98.1 F (36.7 C)  SpO2: 98%    Awake alert and oriented Right axillary incision healing well He can easily abduct his arm to 90 degrees laterally and forward he can fully raise his arm     Assessment:     38 year old male status post transaxillary right first rib resection recovering as expected    Plan:     Refer to physical therapy  Follow-up in 3 to 4 months with repeat TOS duplex.    Cassian Torelli C. Sheree, MD [facility] Office: [phone] Pager: [phone]

## 2024-09-05 ENCOUNTER — Other Ambulatory Visit: Payer: Self-pay | Admitting: *Deleted

## 2024-09-05 DIAGNOSIS — G540 Brachial plexus disorders: Secondary | ICD-10-CM

## 2024-11-03 ENCOUNTER — Other Ambulatory Visit: Payer: Self-pay | Admitting: Sports Medicine

## 2024-12-18 ENCOUNTER — Ambulatory Visit: Admitting: Vascular Surgery

## 2024-12-18 ENCOUNTER — Ambulatory Visit (HOSPITAL_COMMUNITY)
